# Patient Record
Sex: Female | Born: 1965 | Race: White | Hispanic: No | Marital: Married | Smoking: Former smoker
Health system: Southern US, Community
[De-identification: ages and names within clinical notes are randomized; demographics above are authoritative.]

## PROBLEM LIST (undated history)

## (undated) DIAGNOSIS — J189 Pneumonia, unspecified organism: Secondary | ICD-10-CM

## (undated) DIAGNOSIS — R87619 Unspecified abnormal cytological findings in specimens from cervix uteri: Secondary | ICD-10-CM

## (undated) DIAGNOSIS — D259 Leiomyoma of uterus, unspecified: Secondary | ICD-10-CM

## (undated) DIAGNOSIS — K449 Diaphragmatic hernia without obstruction or gangrene: Secondary | ICD-10-CM

## (undated) DIAGNOSIS — M1990 Unspecified osteoarthritis, unspecified site: Secondary | ICD-10-CM

## (undated) DIAGNOSIS — K649 Unspecified hemorrhoids: Secondary | ICD-10-CM

## (undated) DIAGNOSIS — K635 Polyp of colon: Secondary | ICD-10-CM

## (undated) DIAGNOSIS — R55 Syncope and collapse: Secondary | ICD-10-CM

## (undated) DIAGNOSIS — K589 Irritable bowel syndrome without diarrhea: Secondary | ICD-10-CM

## (undated) DIAGNOSIS — K317 Polyp of stomach and duodenum: Secondary | ICD-10-CM

## (undated) DIAGNOSIS — F32A Depression, unspecified: Secondary | ICD-10-CM

## (undated) DIAGNOSIS — E785 Hyperlipidemia, unspecified: Secondary | ICD-10-CM

## (undated) DIAGNOSIS — N816 Rectocele: Secondary | ICD-10-CM

## (undated) DIAGNOSIS — K760 Fatty (change of) liver, not elsewhere classified: Secondary | ICD-10-CM

## (undated) DIAGNOSIS — R000 Tachycardia, unspecified: Secondary | ICD-10-CM

## (undated) DIAGNOSIS — M75102 Unspecified rotator cuff tear or rupture of left shoulder, not specified as traumatic: Secondary | ICD-10-CM

## (undated) DIAGNOSIS — F329 Major depressive disorder, single episode, unspecified: Secondary | ICD-10-CM

## (undated) DIAGNOSIS — K219 Gastro-esophageal reflux disease without esophagitis: Secondary | ICD-10-CM

## (undated) DIAGNOSIS — F419 Anxiety disorder, unspecified: Secondary | ICD-10-CM

## (undated) DIAGNOSIS — D509 Iron deficiency anemia, unspecified: Secondary | ICD-10-CM

## (undated) HISTORY — PX: HYSTEROSCOPY: SHX211

## (undated) HISTORY — DX: Polyp of stomach and duodenum: K31.7

## (undated) HISTORY — DX: Hyperlipidemia, unspecified: E78.5

## (undated) HISTORY — DX: Gastro-esophageal reflux disease without esophagitis: K21.9

## (undated) HISTORY — DX: Unspecified osteoarthritis, unspecified site: M19.90

## (undated) HISTORY — DX: Unspecified hemorrhoids: K64.9

## (undated) HISTORY — DX: Unspecified abnormal cytological findings in specimens from cervix uteri: R87.619

## (undated) HISTORY — DX: Tachycardia, unspecified: R00.0

## (undated) HISTORY — DX: Irritable bowel syndrome, unspecified: K58.9

## (undated) HISTORY — DX: Polyp of colon: K63.5

## (undated) HISTORY — DX: Anxiety disorder, unspecified: F41.9

## (undated) HISTORY — DX: Iron deficiency anemia, unspecified: D50.9

## (undated) HISTORY — DX: Major depressive disorder, single episode, unspecified: F32.9

## (undated) HISTORY — DX: Pneumonia, unspecified organism: J18.9

## (undated) HISTORY — DX: Syncope and collapse: R55

## (undated) HISTORY — DX: Diaphragmatic hernia without obstruction or gangrene: K44.9

## (undated) HISTORY — DX: Fatty (change of) liver, not elsewhere classified: K76.0

## (undated) HISTORY — DX: Rectocele: N81.6

## (undated) HISTORY — DX: Depression, unspecified: F32.A

## (undated) HISTORY — PX: TRIGGER FINGER RELEASE: SHX641

## (undated) HISTORY — PX: CARPAL TUNNEL RELEASE: SHX101

## (undated) HISTORY — PX: ROTATOR CUFF REPAIR: SHX139

## (undated) HISTORY — DX: Unspecified rotator cuff tear or rupture of left shoulder, not specified as traumatic: M75.102

---

## 1997-07-03 HISTORY — PX: COLONOSCOPY: SHX174

## 1998-03-30 ENCOUNTER — Ambulatory Visit (HOSPITAL_COMMUNITY): Admission: RE | Admit: 1998-03-30 | Discharge: 1998-03-30 | Payer: Self-pay | Admitting: Gastroenterology

## 1998-03-31 ENCOUNTER — Encounter: Admission: RE | Admit: 1998-03-31 | Discharge: 1998-03-31 | Payer: Self-pay | Admitting: Internal Medicine

## 1998-04-30 ENCOUNTER — Encounter: Admission: RE | Admit: 1998-04-30 | Discharge: 1998-04-30 | Payer: Self-pay | Admitting: Internal Medicine

## 1998-04-30 ENCOUNTER — Ambulatory Visit (HOSPITAL_COMMUNITY): Admission: RE | Admit: 1998-04-30 | Discharge: 1998-04-30 | Payer: Self-pay | Admitting: Internal Medicine

## 1998-04-30 ENCOUNTER — Encounter: Payer: Self-pay | Admitting: Internal Medicine

## 1998-05-14 ENCOUNTER — Encounter: Admission: RE | Admit: 1998-05-14 | Discharge: 1998-05-14 | Payer: Self-pay | Admitting: Internal Medicine

## 1998-07-14 ENCOUNTER — Encounter: Admission: RE | Admit: 1998-07-14 | Discharge: 1998-07-14 | Payer: Self-pay | Admitting: Hematology and Oncology

## 1998-10-19 ENCOUNTER — Encounter: Admission: RE | Admit: 1998-10-19 | Discharge: 1998-10-19 | Payer: Self-pay | Admitting: Hematology and Oncology

## 1998-10-20 ENCOUNTER — Encounter: Admission: RE | Admit: 1998-10-20 | Discharge: 1998-10-20 | Payer: Self-pay | Admitting: Hematology and Oncology

## 1998-11-09 ENCOUNTER — Other Ambulatory Visit: Admission: RE | Admit: 1998-11-09 | Discharge: 1998-11-09 | Payer: Self-pay | Admitting: *Deleted

## 1998-11-09 ENCOUNTER — Encounter: Admission: RE | Admit: 1998-11-09 | Discharge: 1998-11-09 | Payer: Self-pay | Admitting: Internal Medicine

## 1999-02-24 ENCOUNTER — Encounter: Admission: RE | Admit: 1999-02-24 | Discharge: 1999-02-24 | Payer: Self-pay | Admitting: Internal Medicine

## 1999-10-24 ENCOUNTER — Ambulatory Visit (HOSPITAL_COMMUNITY): Admission: RE | Admit: 1999-10-24 | Discharge: 1999-10-24 | Payer: Self-pay | Admitting: Gastroenterology

## 1999-10-24 ENCOUNTER — Encounter (INDEPENDENT_AMBULATORY_CARE_PROVIDER_SITE_OTHER): Payer: Self-pay

## 2000-12-05 ENCOUNTER — Emergency Department (HOSPITAL_COMMUNITY): Admission: EM | Admit: 2000-12-05 | Discharge: 2000-12-05 | Payer: Self-pay

## 2002-09-08 ENCOUNTER — Encounter: Payer: Self-pay | Admitting: Family Medicine

## 2002-09-08 ENCOUNTER — Encounter: Admission: RE | Admit: 2002-09-08 | Discharge: 2002-09-08 | Payer: Self-pay | Admitting: Family Medicine

## 2002-11-17 ENCOUNTER — Other Ambulatory Visit: Admission: RE | Admit: 2002-11-17 | Discharge: 2002-11-17 | Payer: Self-pay | Admitting: Obstetrics & Gynecology

## 2002-11-17 ENCOUNTER — Other Ambulatory Visit: Admission: RE | Admit: 2002-11-17 | Discharge: 2002-11-17 | Payer: Self-pay | Admitting: Obstetrics and Gynecology

## 2003-01-17 ENCOUNTER — Inpatient Hospital Stay (HOSPITAL_COMMUNITY): Admission: AD | Admit: 2003-01-17 | Discharge: 2003-01-17 | Payer: Self-pay | Admitting: Obstetrics and Gynecology

## 2003-04-23 ENCOUNTER — Ambulatory Visit (HOSPITAL_BASED_OUTPATIENT_CLINIC_OR_DEPARTMENT_OTHER): Admission: RE | Admit: 2003-04-23 | Discharge: 2003-04-23 | Payer: Self-pay | Admitting: Orthopedic Surgery

## 2003-06-18 ENCOUNTER — Other Ambulatory Visit: Admission: RE | Admit: 2003-06-18 | Discharge: 2003-06-18 | Payer: Self-pay | Admitting: Obstetrics and Gynecology

## 2003-08-25 ENCOUNTER — Emergency Department (HOSPITAL_COMMUNITY): Admission: EM | Admit: 2003-08-25 | Discharge: 2003-08-25 | Payer: Self-pay | Admitting: *Deleted

## 2003-09-01 ENCOUNTER — Encounter: Payer: Self-pay | Admitting: Family Medicine

## 2003-09-01 LAB — CONVERTED CEMR LAB

## 2003-10-05 ENCOUNTER — Encounter (INDEPENDENT_AMBULATORY_CARE_PROVIDER_SITE_OTHER): Payer: Self-pay | Admitting: *Deleted

## 2003-10-05 ENCOUNTER — Ambulatory Visit (HOSPITAL_COMMUNITY): Admission: RE | Admit: 2003-10-05 | Discharge: 2003-10-05 | Payer: Self-pay | Admitting: Obstetrics and Gynecology

## 2003-10-12 ENCOUNTER — Encounter: Admission: RE | Admit: 2003-10-12 | Discharge: 2003-12-11 | Payer: Self-pay | Admitting: Orthopedic Surgery

## 2003-11-09 ENCOUNTER — Encounter: Admission: RE | Admit: 2003-11-09 | Discharge: 2003-11-09 | Payer: Self-pay | Admitting: Family Medicine

## 2004-06-03 ENCOUNTER — Ambulatory Visit: Payer: Self-pay | Admitting: Family Medicine

## 2004-07-03 HISTORY — PX: LAPAROSCOPY: SHX197

## 2004-08-10 ENCOUNTER — Encounter: Admission: RE | Admit: 2004-08-10 | Discharge: 2004-08-10 | Payer: Self-pay | Admitting: Obstetrics and Gynecology

## 2004-09-02 ENCOUNTER — Ambulatory Visit: Payer: Self-pay | Admitting: Family Medicine

## 2004-11-10 ENCOUNTER — Encounter: Admission: RE | Admit: 2004-11-10 | Discharge: 2004-11-10 | Payer: Self-pay | Admitting: Allergy and Immunology

## 2004-12-09 ENCOUNTER — Ambulatory Visit (HOSPITAL_COMMUNITY): Admission: RE | Admit: 2004-12-09 | Discharge: 2004-12-09 | Payer: Self-pay | Admitting: Obstetrics and Gynecology

## 2005-02-01 ENCOUNTER — Ambulatory Visit: Payer: Self-pay | Admitting: Family Medicine

## 2005-03-13 ENCOUNTER — Ambulatory Visit: Payer: Self-pay | Admitting: Family Medicine

## 2005-04-04 ENCOUNTER — Ambulatory Visit: Payer: Self-pay | Admitting: Family Medicine

## 2005-04-18 ENCOUNTER — Ambulatory Visit: Payer: Self-pay | Admitting: Family Medicine

## 2005-04-25 ENCOUNTER — Encounter: Admission: RE | Admit: 2005-04-25 | Discharge: 2005-04-25 | Payer: Self-pay | Admitting: Family Medicine

## 2005-05-03 ENCOUNTER — Ambulatory Visit (HOSPITAL_COMMUNITY): Admission: RE | Admit: 2005-05-03 | Discharge: 2005-05-03 | Payer: Self-pay | Admitting: Obstetrics and Gynecology

## 2005-05-03 ENCOUNTER — Encounter (INDEPENDENT_AMBULATORY_CARE_PROVIDER_SITE_OTHER): Payer: Self-pay | Admitting: Specialist

## 2005-08-11 ENCOUNTER — Encounter: Admission: RE | Admit: 2005-08-11 | Discharge: 2005-08-11 | Payer: Self-pay | Admitting: Obstetrics and Gynecology

## 2005-09-25 ENCOUNTER — Ambulatory Visit: Payer: Self-pay | Admitting: Family Medicine

## 2005-10-24 ENCOUNTER — Ambulatory Visit: Payer: Self-pay | Admitting: Family Medicine

## 2006-01-17 ENCOUNTER — Ambulatory Visit: Payer: Self-pay | Admitting: Internal Medicine

## 2006-03-27 ENCOUNTER — Ambulatory Visit: Payer: Self-pay | Admitting: Family Medicine

## 2006-04-24 ENCOUNTER — Ambulatory Visit: Payer: Self-pay | Admitting: Family Medicine

## 2006-05-15 ENCOUNTER — Ambulatory Visit: Payer: Self-pay | Admitting: Family Medicine

## 2006-07-25 ENCOUNTER — Ambulatory Visit: Payer: Self-pay | Admitting: Family Medicine

## 2006-08-20 ENCOUNTER — Ambulatory Visit: Payer: Self-pay | Admitting: Family Medicine

## 2006-08-28 ENCOUNTER — Encounter: Admission: RE | Admit: 2006-08-28 | Discharge: 2006-08-28 | Payer: Self-pay | Admitting: Obstetrics and Gynecology

## 2006-10-12 ENCOUNTER — Ambulatory Visit: Payer: Self-pay | Admitting: Family Medicine

## 2006-10-30 ENCOUNTER — Encounter: Payer: Self-pay | Admitting: Family Medicine

## 2006-10-30 DIAGNOSIS — F419 Anxiety disorder, unspecified: Secondary | ICD-10-CM

## 2006-10-30 DIAGNOSIS — Z87898 Personal history of other specified conditions: Secondary | ICD-10-CM | POA: Insufficient documentation

## 2006-10-30 DIAGNOSIS — F329 Major depressive disorder, single episode, unspecified: Secondary | ICD-10-CM | POA: Insufficient documentation

## 2006-10-30 DIAGNOSIS — J45909 Unspecified asthma, uncomplicated: Secondary | ICD-10-CM | POA: Insufficient documentation

## 2006-10-30 DIAGNOSIS — K648 Other hemorrhoids: Secondary | ICD-10-CM | POA: Insufficient documentation

## 2006-10-30 DIAGNOSIS — F32A Depression, unspecified: Secondary | ICD-10-CM | POA: Insufficient documentation

## 2006-10-30 DIAGNOSIS — E782 Mixed hyperlipidemia: Secondary | ICD-10-CM | POA: Insufficient documentation

## 2006-12-21 ENCOUNTER — Telehealth (INDEPENDENT_AMBULATORY_CARE_PROVIDER_SITE_OTHER): Payer: Self-pay | Admitting: Internal Medicine

## 2007-06-04 ENCOUNTER — Ambulatory Visit: Payer: Self-pay | Admitting: Family Medicine

## 2007-06-14 ENCOUNTER — Telehealth: Payer: Self-pay | Admitting: Family Medicine

## 2007-06-20 ENCOUNTER — Ambulatory Visit: Payer: Self-pay | Admitting: Family Medicine

## 2007-07-04 DIAGNOSIS — M75102 Unspecified rotator cuff tear or rupture of left shoulder, not specified as traumatic: Secondary | ICD-10-CM

## 2007-07-04 HISTORY — DX: Unspecified rotator cuff tear or rupture of left shoulder, not specified as traumatic: M75.102

## 2007-08-05 ENCOUNTER — Ambulatory Visit: Payer: Self-pay | Admitting: Family Medicine

## 2007-08-08 ENCOUNTER — Telehealth: Payer: Self-pay | Admitting: Family Medicine

## 2007-08-12 ENCOUNTER — Telehealth: Payer: Self-pay | Admitting: Family Medicine

## 2007-08-23 ENCOUNTER — Ambulatory Visit: Payer: Self-pay | Admitting: Family Medicine

## 2007-10-08 ENCOUNTER — Ambulatory Visit: Payer: Self-pay | Admitting: Family Medicine

## 2007-10-08 DIAGNOSIS — N809 Endometriosis, unspecified: Secondary | ICD-10-CM | POA: Insufficient documentation

## 2007-10-08 DIAGNOSIS — K589 Irritable bowel syndrome without diarrhea: Secondary | ICD-10-CM | POA: Insufficient documentation

## 2007-10-10 ENCOUNTER — Telehealth: Payer: Self-pay | Admitting: Family Medicine

## 2007-10-14 ENCOUNTER — Telehealth: Payer: Self-pay | Admitting: Family Medicine

## 2007-10-15 ENCOUNTER — Encounter: Payer: Self-pay | Admitting: Family Medicine

## 2007-10-23 ENCOUNTER — Encounter: Admission: RE | Admit: 2007-10-23 | Discharge: 2007-10-23 | Payer: Self-pay | Admitting: Family Medicine

## 2007-10-25 ENCOUNTER — Encounter (INDEPENDENT_AMBULATORY_CARE_PROVIDER_SITE_OTHER): Payer: Self-pay | Admitting: *Deleted

## 2007-10-30 ENCOUNTER — Ambulatory Visit: Payer: Self-pay | Admitting: Family Medicine

## 2007-11-04 ENCOUNTER — Ambulatory Visit: Payer: Self-pay | Admitting: Family Medicine

## 2007-11-15 ENCOUNTER — Ambulatory Visit: Payer: Self-pay | Admitting: Family Medicine

## 2007-11-15 DIAGNOSIS — Z86010 Personal history of colonic polyps: Secondary | ICD-10-CM | POA: Insufficient documentation

## 2007-11-20 ENCOUNTER — Ambulatory Visit: Payer: Self-pay | Admitting: Cardiology

## 2007-11-27 ENCOUNTER — Ambulatory Visit: Payer: Self-pay

## 2007-11-27 ENCOUNTER — Encounter: Payer: Self-pay | Admitting: Cardiology

## 2007-12-03 ENCOUNTER — Telehealth: Payer: Self-pay | Admitting: Family Medicine

## 2007-12-04 ENCOUNTER — Ambulatory Visit: Payer: Self-pay | Admitting: Orthopaedic Surgery

## 2007-12-10 ENCOUNTER — Ambulatory Visit: Payer: Self-pay | Admitting: Orthopaedic Surgery

## 2008-01-02 ENCOUNTER — Encounter: Payer: Self-pay | Admitting: Family Medicine

## 2008-02-04 ENCOUNTER — Ambulatory Visit: Payer: Self-pay | Admitting: Family Medicine

## 2008-02-05 LAB — CONVERTED CEMR LAB
ALT: 23 units/L (ref 0–35)
AST: 20 units/L (ref 0–37)
Cholesterol: 261 mg/dL (ref 0–200)
Direct LDL: 198.5 mg/dL
HDL: 41.7 mg/dL (ref >39.0)
Total CHOL/HDL Ratio: 6.3
Triglycerides: 176 mg/dL — ABNORMAL HIGH (ref 0–149)
VLDL: 35 mg/dL (ref 0–40)

## 2008-02-11 ENCOUNTER — Ambulatory Visit: Payer: Self-pay | Admitting: Family Medicine

## 2008-02-14 ENCOUNTER — Telehealth (INDEPENDENT_AMBULATORY_CARE_PROVIDER_SITE_OTHER): Payer: Self-pay | Admitting: *Deleted

## 2008-03-24 ENCOUNTER — Encounter (INDEPENDENT_AMBULATORY_CARE_PROVIDER_SITE_OTHER): Payer: Self-pay | Admitting: *Deleted

## 2008-04-17 ENCOUNTER — Telehealth: Payer: Self-pay | Admitting: Family Medicine

## 2008-04-30 ENCOUNTER — Ambulatory Visit: Payer: Self-pay | Admitting: Family Medicine

## 2008-06-03 ENCOUNTER — Telehealth: Payer: Self-pay | Admitting: Family Medicine

## 2008-06-12 ENCOUNTER — Ambulatory Visit: Payer: Self-pay | Admitting: Family Medicine

## 2008-08-25 ENCOUNTER — Ambulatory Visit: Payer: Self-pay | Admitting: Family Medicine

## 2008-08-25 LAB — CONVERTED CEMR LAB
Cholesterol, target level: 200 mg/dL
HDL goal, serum: 40 mg/dL
LDL Goal: 160 mg/dL

## 2008-09-01 ENCOUNTER — Telehealth: Payer: Self-pay | Admitting: Family Medicine

## 2008-09-03 ENCOUNTER — Encounter: Admission: RE | Admit: 2008-09-03 | Discharge: 2008-09-03 | Payer: Self-pay | Admitting: Family Medicine

## 2008-09-03 ENCOUNTER — Ambulatory Visit: Payer: Self-pay | Admitting: Family Medicine

## 2008-09-09 ENCOUNTER — Encounter: Payer: Self-pay | Admitting: Family Medicine

## 2008-09-24 ENCOUNTER — Encounter: Payer: Self-pay | Admitting: Family Medicine

## 2008-10-23 ENCOUNTER — Encounter: Admission: RE | Admit: 2008-10-23 | Discharge: 2008-10-23 | Payer: Self-pay | Admitting: Family Medicine

## 2008-10-23 ENCOUNTER — Ambulatory Visit: Payer: Self-pay | Admitting: Internal Medicine

## 2008-10-23 ENCOUNTER — Encounter (INDEPENDENT_AMBULATORY_CARE_PROVIDER_SITE_OTHER): Payer: Self-pay | Admitting: Internal Medicine

## 2008-10-28 ENCOUNTER — Encounter (INDEPENDENT_AMBULATORY_CARE_PROVIDER_SITE_OTHER): Payer: Self-pay | Admitting: *Deleted

## 2008-11-05 ENCOUNTER — Telehealth (INDEPENDENT_AMBULATORY_CARE_PROVIDER_SITE_OTHER): Payer: Self-pay | Admitting: *Deleted

## 2008-12-01 ENCOUNTER — Encounter: Payer: Self-pay | Admitting: Family Medicine

## 2009-01-01 ENCOUNTER — Encounter (INDEPENDENT_AMBULATORY_CARE_PROVIDER_SITE_OTHER): Payer: Self-pay | Admitting: *Deleted

## 2009-02-24 ENCOUNTER — Telehealth: Payer: Self-pay | Admitting: Family Medicine

## 2009-03-02 ENCOUNTER — Encounter: Payer: Self-pay | Admitting: Family Medicine

## 2009-03-03 ENCOUNTER — Ambulatory Visit: Payer: Self-pay | Admitting: Family Medicine

## 2009-04-29 ENCOUNTER — Encounter: Payer: Self-pay | Admitting: Family Medicine

## 2009-06-24 ENCOUNTER — Ambulatory Visit: Payer: Self-pay | Admitting: Family Medicine

## 2009-07-09 ENCOUNTER — Telehealth: Payer: Self-pay | Admitting: Family Medicine

## 2009-08-03 ENCOUNTER — Telehealth: Payer: Self-pay | Admitting: Family Medicine

## 2009-10-25 ENCOUNTER — Encounter: Admission: RE | Admit: 2009-10-25 | Discharge: 2009-10-25 | Payer: Self-pay | Admitting: Family Medicine

## 2009-10-27 ENCOUNTER — Encounter (INDEPENDENT_AMBULATORY_CARE_PROVIDER_SITE_OTHER): Payer: Self-pay | Admitting: *Deleted

## 2009-10-27 ENCOUNTER — Encounter: Payer: Self-pay | Admitting: Family Medicine

## 2009-12-09 ENCOUNTER — Telehealth: Payer: Self-pay | Admitting: Family Medicine

## 2010-04-07 ENCOUNTER — Encounter (INDEPENDENT_AMBULATORY_CARE_PROVIDER_SITE_OTHER): Payer: Self-pay | Admitting: *Deleted

## 2010-05-12 ENCOUNTER — Ambulatory Visit: Payer: Self-pay | Admitting: Family Medicine

## 2010-06-09 ENCOUNTER — Ambulatory Visit: Payer: Self-pay | Admitting: Family Medicine

## 2010-06-09 DIAGNOSIS — R3129 Other microscopic hematuria: Secondary | ICD-10-CM | POA: Insufficient documentation

## 2010-06-09 LAB — CONVERTED CEMR LAB
Bilirubin Urine: NEGATIVE
Glucose, Urine, Semiquant: NEGATIVE
Ketones, urine, test strip: NEGATIVE
Nitrite: NEGATIVE
Specific Gravity, Urine: 1.02
Urobilinogen, UA: 0.2
WBC Urine, dipstick: NEGATIVE
pH: 6

## 2010-06-10 ENCOUNTER — Encounter: Payer: Self-pay | Admitting: Family Medicine

## 2010-06-29 ENCOUNTER — Telehealth: Payer: Self-pay | Admitting: Family Medicine

## 2010-07-03 DIAGNOSIS — D509 Iron deficiency anemia, unspecified: Secondary | ICD-10-CM

## 2010-07-03 HISTORY — DX: Iron deficiency anemia, unspecified: D50.9

## 2010-07-06 ENCOUNTER — Ambulatory Visit
Admission: RE | Admit: 2010-07-06 | Discharge: 2010-07-06 | Payer: Self-pay | Source: Home / Self Care | Attending: Family Medicine | Admitting: Family Medicine

## 2010-07-06 DIAGNOSIS — R350 Frequency of micturition: Secondary | ICD-10-CM | POA: Insufficient documentation

## 2010-07-06 LAB — CONVERTED CEMR LAB
Bacteria, UA: 0
Beta hcg, urine, semiquantitative: NEGATIVE
Bilirubin Urine: NEGATIVE
Casts: 0 /lpf
Glucose, Urine, Semiquant: NEGATIVE
KOH Prep: NEGATIVE
Ketones, urine, test strip: NEGATIVE
Mucus, UA: 0
Nitrite: NEGATIVE
Protein, U semiquant: NEGATIVE
RBC / HPF: 0
Specific Gravity, Urine: 1.01
Urobilinogen, UA: 0.2
WBC Urine, dipstick: NEGATIVE
WBC, UA: 0 cells/hpf
Whiff Test: NEGATIVE
Yeast, UA: 0
pH: 6

## 2010-07-11 ENCOUNTER — Telehealth (INDEPENDENT_AMBULATORY_CARE_PROVIDER_SITE_OTHER): Payer: Self-pay | Admitting: *Deleted

## 2010-07-13 ENCOUNTER — Ambulatory Visit
Admission: RE | Admit: 2010-07-13 | Discharge: 2010-07-13 | Payer: Self-pay | Source: Home / Self Care | Attending: Family Medicine | Admitting: Family Medicine

## 2010-07-13 ENCOUNTER — Other Ambulatory Visit: Payer: Self-pay | Admitting: Family Medicine

## 2010-07-13 LAB — CBC WITH DIFFERENTIAL/PLATELET
Basophils Absolute: 0.1 10*3/uL (ref 0.0–0.1)
Basophils Relative: 1.2 % (ref 0.0–3.0)
Eosinophils Absolute: 0.2 10*3/uL (ref 0.0–0.7)
Eosinophils Relative: 3.4 % (ref 0.0–5.0)
HCT: 26.3 % — ABNORMAL LOW (ref 36.0–46.0)
Hemoglobin: 8.4 g/dL — ABNORMAL LOW (ref 12.0–15.0)
Lymphocytes Relative: 36.1 % (ref 12.0–46.0)
Lymphs Abs: 2 10*3/uL (ref 0.7–4.0)
MCHC: 32.1 g/dL (ref 30.0–36.0)
MCV: 67.2 fl — ABNORMAL LOW (ref 78.0–100.0)
Monocytes Absolute: 0.5 10*3/uL (ref 0.1–1.0)
Monocytes Relative: 8.2 % (ref 3.0–12.0)
Neutro Abs: 2.8 10*3/uL (ref 1.4–7.7)
Neutrophils Relative %: 51.1 % (ref 43.0–77.0)
Platelets: 366 10*3/uL (ref 150.0–400.0)
RBC: 3.91 Mil/uL (ref 3.87–5.11)
RDW: 18.7 % — ABNORMAL HIGH (ref 11.5–14.6)
WBC: 5.5 10*3/uL (ref 4.5–10.5)

## 2010-07-13 LAB — BASIC METABOLIC PANEL
BUN: 14 mg/dL (ref 6–23)
CO2: 29 mEq/L (ref 19–32)
Calcium: 9 mg/dL (ref 8.4–10.5)
Chloride: 107 mEq/L (ref 96–112)
Creatinine, Ser: 0.7 mg/dL (ref 0.4–1.2)
GFR: 91.4 mL/min (ref >60.00)
Glucose, Bld: 106 mg/dL — ABNORMAL HIGH (ref 70–99)
Potassium: 4.5 mEq/L (ref 3.5–5.1)
Sodium: 141 mEq/L (ref 135–145)

## 2010-07-13 LAB — HEPATIC FUNCTION PANEL
ALT: 14 U/L (ref 0–35)
AST: 17 U/L (ref 0–37)
Albumin: 3.4 g/dL — ABNORMAL LOW (ref 3.5–5.2)
Alkaline Phosphatase: 84 U/L (ref 39–117)
Bilirubin, Direct: 0 mg/dL (ref 0.0–0.3)
Total Bilirubin: 0.4 mg/dL (ref 0.3–1.2)
Total Protein: 6.2 g/dL (ref 6.0–8.3)

## 2010-07-13 LAB — LIPID PANEL
Cholesterol: 199 mg/dL (ref 0–200)
HDL: 40.7 mg/dL (ref >39.00)
LDL Cholesterol: 135 mg/dL — ABNORMAL HIGH (ref 0–99)
Total CHOL/HDL Ratio: 5
Triglycerides: 117 mg/dL (ref 0.0–149.0)
VLDL: 23.4 mg/dL (ref 0.0–40.0)

## 2010-07-13 LAB — TSH: TSH: 2.3 u[IU]/mL (ref 0.35–5.50)

## 2010-07-19 ENCOUNTER — Encounter: Payer: Self-pay | Admitting: Family Medicine

## 2010-07-19 ENCOUNTER — Ambulatory Visit
Admission: RE | Admit: 2010-07-19 | Discharge: 2010-07-19 | Payer: Self-pay | Source: Home / Self Care | Attending: Family Medicine | Admitting: Family Medicine

## 2010-07-19 ENCOUNTER — Other Ambulatory Visit: Payer: Self-pay | Admitting: Family Medicine

## 2010-07-19 ENCOUNTER — Other Ambulatory Visit
Admission: RE | Admit: 2010-07-19 | Discharge: 2010-07-19 | Payer: Self-pay | Source: Home / Self Care | Admitting: Family Medicine

## 2010-07-19 DIAGNOSIS — Z862 Personal history of diseases of the blood and blood-forming organs and certain disorders involving the immune mechanism: Secondary | ICD-10-CM | POA: Insufficient documentation

## 2010-07-19 LAB — CONVERTED CEMR LAB: Pap Smear: NORMAL

## 2010-07-19 LAB — B12 AND FOLATE PANEL
Folate: >24.8 ng/mL (ref >5.9)
Vitamin B-12: 478 pg/mL (ref 211–911)

## 2010-07-19 LAB — IRON: Iron: 13 ug/dL — ABNORMAL LOW (ref 42–145)

## 2010-07-19 LAB — FERRITIN: Ferritin: 2.8 ng/mL — ABNORMAL LOW (ref 10.0–291.0)

## 2010-07-19 LAB — HM PAP SMEAR

## 2010-07-23 ENCOUNTER — Other Ambulatory Visit: Payer: Self-pay | Admitting: Family Medicine

## 2010-07-23 DIAGNOSIS — Z0000 Encounter for general adult medical examination without abnormal findings: Secondary | ICD-10-CM

## 2010-07-26 ENCOUNTER — Encounter: Payer: Self-pay | Admitting: Family Medicine

## 2010-07-26 ENCOUNTER — Encounter (INDEPENDENT_AMBULATORY_CARE_PROVIDER_SITE_OTHER): Payer: Self-pay | Admitting: *Deleted

## 2010-07-31 LAB — CONVERTED CEMR LAB
ALT: 12 units/L (ref 0–35)
AST: 16 units/L (ref 0–37)
Albumin: 3.3 g/dL — ABNORMAL LOW (ref 3.5–5.2)
Alkaline Phosphatase: 69 units/L (ref 39–117)
BUN: 7 mg/dL (ref 6–23)
Basophils Absolute: 0 10*3/uL (ref 0.0–0.1)
Basophils Relative: 0.2 % (ref 0.0–1.0)
Bilirubin, Direct: 0.1 mg/dL (ref 0.0–0.3)
CO2: 28 meq/L (ref 19–32)
Calcium: 9.1 mg/dL (ref 8.4–10.5)
Chloride: 107 meq/L (ref 96–112)
Cholesterol: 217 mg/dL (ref 0–200)
Creatinine, Ser: 0.8 mg/dL (ref 0.4–1.2)
Direct LDL: 171.7 mg/dL
Eosinophils Absolute: 0.2 10*3/uL (ref 0.0–0.7)
Eosinophils Relative: 4.2 % (ref 0.0–5.0)
GFR calc Af Amer: 101 mL/min
GFR calc non Af Amer: 84 mL/min
Glucose, Bld: 102 mg/dL — ABNORMAL HIGH (ref 70–99)
HCT: 37.2 % (ref 36.0–46.0)
HDL: 41.6 mg/dL (ref >39.0)
Hemoglobin: 12.5 g/dL (ref 12.0–15.0)
Lymphocytes Relative: 33.8 % (ref 12.0–46.0)
MCHC: 33.4 g/dL (ref 30.0–36.0)
MCV: 89.4 fL (ref 78.0–100.0)
Monocytes Absolute: 0.4 10*3/uL (ref 0.1–1.0)
Monocytes Relative: 8.4 % (ref 3.0–12.0)
Neutro Abs: 2.8 10*3/uL (ref 1.4–7.7)
Neutrophils Relative %: 53.4 % (ref 43.0–77.0)
Platelets: 240 10*3/uL (ref 150–400)
Potassium: 4.1 meq/L (ref 3.5–5.1)
RBC: 4.16 M/uL (ref 3.87–5.11)
RDW: 13.1 % (ref 11.5–14.6)
Sodium: 140 meq/L (ref 135–145)
TSH: 1.54 microintl units/mL (ref 0.35–5.50)
Total Bilirubin: 0.6 mg/dL (ref 0.3–1.2)
Total CHOL/HDL Ratio: 5.2
Total Protein: 6.1 g/dL (ref 6.0–8.3)
Triglycerides: 84 mg/dL (ref 0–149)
VLDL: 17 mg/dL (ref 0–40)
WBC: 5.1 10*3/uL (ref 4.5–10.5)

## 2010-08-02 NOTE — Progress Notes (Signed)
Summary: Rx Fluoxetine  Phone Note Refill Request Call back at [phone] Message from:  [facility] on August 03, 2009 9:47 AM  Refills Requested: Medication #1:  FLUOXETINE HCL 40 MG CAPS Take one by mouth q pm Received faxed refill request, please advise   Method Requested: Telephone to Pharmacy Initial call taken by: Linde Gillis CMA Duncan Dull),  August 03, 2009 9:48 AM  Follow-up for Phone Call        px written on EMR for call in  Follow-up by: Judith Part MD,  August 03, 2009 10:59 AM  Additional Follow-up for Phone Call Additional follow up Details #1::        Rx called to pharmacy Additional Follow-up by: Linde Gillis CMA Duncan Dull),  August 03, 2009 12:41 PM    Prescriptions: FLUOXETINE HCL 40 MG CAPS (FLUOXETINE HCL) Take one by mouth q pm  #30 x 5   Entered and Authorized by:   Judith Part MD   Signed by:   Judith Part MD on 08/03/2009   Method used:   Telephoned to ...       [facility]       [address]       [place], [place]  [address]       Ph: [phone]       Fax: [phone]   RxID:   [id number]

## 2010-08-02 NOTE — Letter (Signed)
Summary: Results Follow up Letter  [place] [facility]  [address] [place], [place] [address]   Phone: [phone]  Fax: [phone]    10/27/2009 MRN: [id number]    Michele [name] [address] [place], [place]  [address]    Dear [name],  The following are the results of your recent test(s):  Test         Result    Pap Smear:        Normal _____  Not Normal _____ Comments: ______________________________________________________ Cholesterol: LDL(Bad cholesterol):         Your goal is less than:         HDL (Good cholesterol):       Your goal is more than: Comments:  ______________________________________________________ Mammogram:        Normal ___X__  Not Normal _____ Comments:  Yearly follow up is recommended.   ___________________________________________________________________ Hemoccult:        Normal _____  Not normal _______ Comments:    _____________________________________________________________________ Other Tests:    We routinely do not discuss normal results over the telephone.  If you desire a copy of the results, or you have any questions about this information we can discuss them at your next office visit.   Sincerely,    Marne A. Milinda Antis, M.D.  MAT:lsf

## 2010-08-02 NOTE — Progress Notes (Signed)
Summary: Singulair 10mg  and Nexium 40mg  refill (does pt need appt.)  Phone Note Refill Request Call back at [phone] [facility]  [facility] on December 09, 2009 12:24 PM  Refills Requested: Medication #1:  SINGULAIR 10 MG TABS Take one by mouth qhs  Medication #2:  NEXIUM 40 MG CPDR 1 by mouth each am [facility] request refill for Singulair 10mg  and Nexium 40 mg. No refill date given. I cannot see where pt has had labs since 02/2008. does pt need appt? Please advise.    Method Requested: Telephone to Pharmacy Initial call taken by: [name] LPN,  December 10, 3242 12:26 PM  Follow-up for Phone Call        schedule her for f/u (will do labs then) this summer  thanks px written on EMR for call in   Additional Follow-up for Phone Call Additional follow up Details #1::        [facility] as instructed. request added to rx for pt to call for appt this summer.[name] LPN  December 09, 100 12:55 PM     New/Updated Medications: SINGULAIR 10 MG TABS (MONTELUKAST SODIUM) Take one by mouth at bedtime NEXIUM 40 MG CPDR (ESOMEPRAZOLE MAGNESIUM) 1 by mouth each am Prescriptions: NEXIUM 40 MG CPDR (ESOMEPRAZOLE MAGNESIUM) 1 by mouth each am  #30 x 11   Entered and Authorized by:   Judith Part MD   Signed by:   [name] LPN on 72/53/6644   Method used:   Telephoned to ...       [id number]       [address]       [place], [place]  [address]       Ph: [phone]       Fax: [phone]   RxID:   [id number] SINGULAIR 10 MG TABS (MONTELUKAST SODIUM) Take one by mouth at bedtime  #30 x 11   Entered and Authorized by:   Judith Part MD   Signed by:   [name] LPN on 22/08/5425   Method used:   Telephoned to ...       [facility]       [address]       [place], [place]  [address]       Ph: [phone]       Fax:  [phone]   RxID:   [id number]

## 2010-08-02 NOTE — Assessment & Plan Note (Signed)
Summary: cough and congestion/alc   Vital Signs:  Patient profile:   45 year old female Height:      68 inches Weight:      194.75 pounds BMI:     29.72 O2 Sat:      98 % on Room air Temp:     98 degrees F oral Pulse rate:   84 / minute Pulse rhythm:   regular Resp:     20 per minute BP sitting:   126 / 84  (left arm) Cuff size:   regular  Vitals Entered By: [name] LPN (May 12, 2010 11:56 AM)  O2 Flow:  Room air CC: head and chest congestion, chest pain with deep breath,Lt ear draining and non productive cough   History of Present Illness: started getting sick with uri 2 days ago  hit very hard and suddenly bad head and chest congestion -- with dry rattly cough/ hacking  cough is making chest hurt  no wheeze  head full of congestion and sinus headache -- green mucous   has had chronic allergy problems lately   L ear was draining -- raw feeling on inside of thoat   no fever  no chills/ sweats   Allergies: 1)  ! * Telfa Pads 2)  ! Darvocet 3)  ! Nsaids 4)  Betadine  Past History:  Past Medical History: Last updated: 11/15/2007 Anxiety Asthma Hyperlipidemia tachycardia s/p neg cardiac wu with echo IBS endometriosis 09 ? L rot cuff tear  syncope  Past Surgical History: Last updated: 10/08/2007 abn pap cryotx uterine polyps, hysteroscopy 99 EGD 99 colonoscopy, small polyps, hemorrhoids carpal tunnel sx 5/05 abd [place] neg laparoscopy - 06 with endometriosis  Family History: Last updated: 11/04/2007 father Barrett's esophagus, HTN, DM  mother CVA,  migraine,, high chol  sister DM sister chol  Social History: Last updated: 02/11/2008 Former Smoker occ alcohol   Risk Factors: Smoking Status: quit (10/30/2006)  Review of Systems General:  Complains of fatigue, loss of appetite, and malaise; denies fever. Eyes:  Denies blurring and eye irritation. ENT:  Complains of earache, nasal congestion, postnasal drainage, sinus pressure, and sore  throat. CV:  Denies chest pain or discomfort and palpitations. Resp:  Complains of cough; denies pleuritic, shortness of breath, and wheezing. GI:  Denies diarrhea, nausea, and vomiting. Derm:  Denies rash.  Physical Exam  General:  overweight but generally well appearing  Head:  bilat maxillary sinus tenderness normocephalic, atraumatic, and no abnormalities observed.   Eyes:  vision grossly intact, pupils equal, pupils round, and pupils reactive to light.  no conjunctival pallor, injection or icterus  Ears:  R ear normal and L ear normal.   Nose:  nares are injected and congested bilaterally  Mouth:  pharynx pink and moist, no erythema, and no exudates.   Neck:  No deformities, masses, or tenderness noted. Lungs:  Normal respiratory effort, chest expands symmetrically. Lungs are clear to auscultation, no crackles or wheezes. Heart:  RRR  Skin:  Intact without suspicious lesions or rashes Cervical Nodes:  No lymphadenopathy noted Psych:  normal affect, talkative and pleasant    Impression & Recommendations:  Problem # 1:  URI (ICD-465.9) Assessment New  viral uri vs poss early sinusitis from allergies recommend sympt care- see pt instructions   pt advised to update me if symptoms worsen or do not improve will start augmentin if not imp in 2-3 d or worse sinus pain  Orders: Prescription Created Electronically [phone])  Complete Medication List:  1)  Singulair 10 Mg Tabs (Montelukast sodium) .... Take one by mouth at bedtime 2)  Fluoxetine Hcl 40 Mg Caps (Fluoxetine hcl) .... Take one by mouth q pm 3)  Anusol-hc 2.5 % Crea (Hydrocortisone) .... Apply to affected area once daily prn 4)  Omega 3  .... Daily 5)  Vitamin C  .... Daily 6)  Calcium  .... Daily 7)  Nexium 40 Mg Cpdr (Esomeprazole magnesium) .Marland Kitchen.. 1 by mouth each am 8)  Flonase 50 Mcg/act Susp (Fluticasone propionate) .... 2 sprays in each nostril once daily as needed 9)  Proair Hfa 108 (90 Base) Mcg/act Aers  (Albuterol sulfate) .... 2 puffs up to every 4 hours as needed wheezing 10)  Augmentin 875-125 Mg Tabs (Amoxicillin-pot clavulanate) .Marland Kitchen.. 1 by mouth two times a day for 10 days for sinus infection  Patient Instructions: 1)  you can try mucinex over the counter twice daily as directed and nasal saline spray for congestion 2)  tylenol over the counter as directed may help with aches, headache and fever 3)  call if symptoms worsen or if not improved in 4-5 days  4)  if not improving in 2-3 days take the augmentin  Prescriptions: FLONASE 50 MCG/ACT SUSP (FLUTICASONE PROPIONATE) 2 sprays in each nostril once daily as needed  #1 mdi x 11   Entered and Authorized by:   Judith Part MD   Signed by:   Judith Part MD on 05/12/2010   Method used:   Print then Give to Patient   RxID:   [id number] AUGMENTIN 875-125 MG TABS (AMOXICILLIN-POT CLAVULANATE) 1 by mouth two times a day for 10 days for sinus infection  #20 x 0   Entered and Authorized by:   Judith Part MD   Signed by:   Judith Part MD on 05/12/2010   Method used:   Print then Give to Patient   RxID:   [id number]    Orders Added: 1)  Est. Patient Level III [84696] 2)  Prescription Created Electronically [phone]    Current Allergies (reviewed today): ! * TELFA PADS ! DARVOCET ! NSAIDS BETADINE

## 2010-08-02 NOTE — Assessment & Plan Note (Signed)
Summary: BACK PAIN- TOWER PT   Vital Signs:  Patient profile:   45 year old female Height:      68 inches Weight:      194.50 pounds BMI:     29.68 Temp:     98.6 degrees F oral Pulse rate:   76 / minute Pulse rhythm:   regular BP sitting:   120 / 70  (right arm) Cuff size:   regular  Vitals Entered By: Linde Gillis CMA Duncan Dull) (June 09, 2010 8:26 AM) CC: back pain   History of Present Illness: 45 yo here for right sided back pain.  No known injury. A few days ago, right side of back started hurting, no radiculopathy. Pain is worsened by standing and walking. Back brace helps a little bit.  Has had some increased urinary frequency, no dysuria. She is not sure if this is related shince she has had urinary issues for years.  Just fininished course of Augmentin for sinusitis, now has a vaginal yeast infection.  Requesting diflucan.  Current Medications (verified): 1)  Singulair 10 Mg Tabs (Montelukast Sodium) .... Take One By Mouth At Bedtime 2)  Fluoxetine Hcl 40 Mg Caps (Fluoxetine Hcl) .... Take One By Mouth Q Pm 3)  Anusol-Hc 2.5 %  Crea (Hydrocortisone) .... Apply To Affected Area Once Daily Prn 4)  Omega 3 .... Daily 5)  Vitamin C .... Daily 6)  Calcium .... Daily 7)  Nexium 40 Mg Cpdr (Esomeprazole Magnesium) .Marland Kitchen.. 1 By Mouth Each Am 8)  Flonase 50 Mcg/act Susp (Fluticasone Propionate) .... 2 Sprays in Each Nostril Once Daily As Needed 9)  Proair Hfa 108 (90 Base) Mcg/act Aers (Albuterol Sulfate) .... 2 Puffs Up To Every 4 Hours As Needed Wheezing 10)  Diflucan 150 Mg Tabs (Fluconazole) .... Once Daily  Allergies: 1)  ! * Telfa Pads 2)  ! Darvocet 3)  ! Nsaids 4)  Betadine  Past History:  Past Medical History: Last updated: 11/15/2007 Anxiety Asthma Hyperlipidemia tachycardia s/p neg cardiac wu with echo IBS endometriosis 09 ? L rot cuff tear  syncope  Past Surgical History: Last updated: 10/08/2007 abn pap cryotx uterine polyps,  hysteroscopy 99 EGD 99 colonoscopy, small polyps, hemorrhoids carpal tunnel sx 5/05 abd [place] neg laparoscopy - 06 with endometriosis  Family History: Last updated: 11/04/2007 father Barrett's esophagus, HTN, DM  mother CVA,  migraine,, high chol  sister DM sister chol  Social History: Last updated: 02/11/2008 Former Smoker occ alcohol   Risk Factors: Smoking Status: quit (10/30/2006)  Review of Systems      See HPI General:  Denies fever. GI:  Denies abdominal pain. GU:  Complains of urinary frequency; denies dysuria and incontinence.  Physical Exam  General:  overweight but generally well appearing VSS, non toxic appearing  Abdomen:  soft and non-tender.   no suprapubic or CVA tenderness. Msk:  tight paraspinous muscle, right, SLR pos right, neg left. Neg fabers. normal strength. Neurologic:  DTRs symmetrical and normal.   Psych:  normal affect, talkative and pleasant    Impression & Recommendations:  Problem # 1:  BACK PAIN, RIGHT (ICD-724.5) Assessment New Most consistent with spasm/sciatica.  Will treat conservatively with flexeril, heat as needed. Her updated medication list for this problem includes:    Cyclobenzaprine Hcl 10 Mg Tabs (Cyclobenzaprine hcl) .Marland Kitchen... 1 by mouth 2 times daily as needed for back pain  Orders: UA Dipstick w/o Micro (manual) (27253)  Problem # 2:  MICROSCOPIC HEMATURIA (ICD-599.72) Assessment: New  Will send urine for culture. Likely due to vulvular irritation from yeast infection.  Treat with Diflucan. If urine culture neg, needs to come in for repeat UA with Dr. Milinda Antis.  Orders: T-Culture, Urine ([id number])  Complete Medication List: 1)  Singulair 10 Mg Tabs (Montelukast sodium) .... Take one by mouth at bedtime 2)  Fluoxetine Hcl 40 Mg Caps (Fluoxetine hcl) .... Take one by mouth q pm 3)  Anusol-hc 2.5 % Crea (Hydrocortisone) .... Apply to affected area once daily prn 4)  Omega 3  .... Daily 5)  Vitamin C  ....  Daily 6)  Calcium  .... Daily 7)  Nexium 40 Mg Cpdr (Esomeprazole magnesium) .Marland Kitchen.. 1 by mouth each am 8)  Flonase 50 Mcg/act Susp (Fluticasone propionate) .... 2 sprays in each nostril once daily as needed 9)  Proair Hfa 108 (90 Base) Mcg/act Aers (Albuterol sulfate) .... 2 puffs up to every 4 hours as needed wheezing 10)  Diflucan 150 Mg Tabs (Fluconazole) .... Once daily 11)  Cyclobenzaprine Hcl 10 Mg Tabs (Cyclobenzaprine hcl) .Marland Kitchen.. 1 by mouth 2 times daily as needed for back pain Prescriptions: CYCLOBENZAPRINE HCL 10 MG  TABS (CYCLOBENZAPRINE HCL) 1 by mouth 2 times daily as needed for back pain  #30 x 0   Entered and Authorized by:   Ruthe Mannan MD   Signed by:   Ruthe Mannan MD on 06/09/2010   Method used:   [facility]ronically to        [facility] [address] [address] [facility]retail)       [address]       [place], [place]  [address]       Ph: [phone] or [phone]       Fax: [phone]   RxID:   [id number] DIFLUCAN 150 MG TABS (FLUCONAZOLE) once daily  #1 x 0   Entered and Authorized by:   Ruthe Mannan MD   Signed by:   Ruthe Mannan MD on 06/09/2010   Method used:   Electronically to        [facility] [address] [facility]       [address]       [place], [place]  [address]       Ph: [phone] or [phone]       Fax: [phone]   RxID:   [id number]    Orders Added: 1)  UA Dipstick w/o Micro (manual) [81002] 2)  T-Culture, Urine [[id number]] 3)  Est. Patient Level IV [61607]    Current Allergies (reviewed today): ! * TELFA PADS ! DARVOCET ! NSAIDS BETADINE  Laboratory Results   Urine Tests  Date/Time Received: June 09, 2010 8:42 AM   Routine Urinalysis   Color: yellow Appearance: Clear Glucose: negative   (Normal Range: Negative) Bilirubin: negative   (Normal Range: Negative) Ketone: negative   (Normal Range: Negative) Spec. Gravity: 1.020   (Normal Range: 1.003-1.035) Blood: moderate   (Normal Range: Negative) pH: 6.0   (Normal Range:  5.0-8.0) Protein: trace   (Normal Range: Negative) Urobilinogen: 0.2   (Normal Range: 0-1) Nitrite: negative   (Normal Range: Negative) Leukocyte Esterace: negative   (Normal Range: Negative)

## 2010-08-02 NOTE — Miscellaneous (Signed)
[facility]   Clinical Lists Changes  Observations: Added new observation of FLU VAX: Historical (04/07/2010 14:28)      Immunization History:  Influenza Immunization History:    Influenza:  historical (04/07/2010)  Pt [facility].  Lowella Petties CMA  April 07, 2010 2:29 PM

## 2010-08-02 NOTE — Letter (Signed)
Summary: Results Follow up Letter  [place] [facility]  [address] [place], [place] [address]   Phone: [phone]  Fax: [phone]    10/27/2009 MRN: [id number]    Michele [name] [address] [place], [place]  [address]    Dear [name],  The following are the results of your recent test(s):  Test         Result    Pap Smear:        Normal _____  Not Normal _____ Comments: ______________________________________________________ Cholesterol: LDL(Bad cholesterol):         Your goal is less than:         HDL (Good cholesterol):       Your goal is more than: Comments:  ______________________________________________________ Mammogram:        Normal __X___  Not Normal _____ Comments:Please repeat mammogram in one year.  ___________________________________________________________________ Hemoccult:        Normal _____  Not normal _______ Comments:    _____________________________________________________________________ Other Tests:    We routinely do not discuss normal results over the telephone.  If you desire a copy of the results, or you have any questions about this information we can discuss them at your next office visit.   Sincerely,    Idamae Schuller Tower,MD  MT/ri

## 2010-08-02 NOTE — Progress Notes (Signed)
Summary: Yeast infection  Phone Note Call from Patient Call back at [phone]   Caller: Patient Call For: Judith Part MD Summary of Call: Requests a Rx. for yeast infection.  Says she took the ABX that you gave her and now she has yeast.     Also requests the cream that you once gave her for hemorrhoids.   [facility] [address] Initial call taken by: Delilah Shan CMA Duncan Dull),  July 09, 2009 1:07 PM  Follow-up for Phone Call        px written on EMR for call in  let me know if not imp Follow-up by: Judith Part MD,  July 09, 2009 1:33 PM  Additional Follow-up for Phone Call Additional follow up Details #1::        Meds called to [facility]. Additional Follow-up by: Lowella Petties CMA,  July 09, 2009 2:41 PM    New/Updated Medications: DIFLUCAN 150 MG TABS (FLUCONAZOLE) 1 by mouth times one for yeast Prescriptions: DIFLUCAN 150 MG TABS (FLUCONAZOLE) 1 by mouth times one for yeast  #1 x 0   Entered and Authorized by:   Judith Part MD   Signed by:   Lowella Petties CMA on 07/09/2009   Method used:   Telephoned to ...       [facility]       [address]       [place], [place]  [address]       Ph: [phone]       Fax: [phone]   RxID:   [id number] ANUSOL-HC 2.5 %  CREA (HYDROCORTISONE) apply to affected area once daily prn  #1 small x 0   Entered and Authorized by:   Judith Part MD   Signed by:   Lowella Petties CMA on 07/09/2009   Method used:   Telephoned to ...       [facility] [address] [facility]       [address]       [place], [place]  [address]       Ph: [phone]       Fax: [phone]   RxID:   [id number]

## 2010-08-03 HISTORY — PX: ESOPHAGOGASTRODUODENOSCOPY: SHX1529

## 2010-08-04 ENCOUNTER — Encounter: Payer: Self-pay | Admitting: Family Medicine

## 2010-08-04 NOTE — Letter (Signed)
Summary: Results Follow up Letter  [place] at [facility]  [address] [place], [place] [address]   Phone: [phone]  Fax: [phone]    07/26/2010 MRN: [id number]  Michele [name] [address] [place], [place]  [address]  Dear Ms. Casados,  The following are the results of your recent test(s):  Test         Result    Pap Smear:        Normal __X___  Not Normal _____ Comments: ______________________________________________________ Cholesterol: LDL(Bad cholesterol):         Your goal is less than:         HDL (Good cholesterol):       Your goal is more than: Comments:  ______________________________________________________ Mammogram:        Normal _____  Not Normal _____ Comments:  ___________________________________________________________________ Hemoccult:        Normal _____  Not normal _______ Comments:    _____________________________________________________________________ Other Tests:    We routinely do not discuss normal results over the telephone.  If you desire a copy of the results, or you have any questions about this information we can discuss them at your next office visit.   Sincerely,       Sharilyn Sites for Dr. [name]

## 2010-08-04 NOTE — Progress Notes (Signed)
Summary: wants diflucan  Phone Note Call from Patient Call back at Home Phone [phone]   Caller: Patient Call For: Judith Part MD Summary of Call: Pt is asking for diflucan for a yeast infection.  She had one a few weeks back but now has another.  Has itching, discharge, irritation.  [facility]. Initial call taken by: Lowella Petties CMA, AAMA,  June 29, 2010 12:58 PM  Follow-up for Phone Call        can do diflucan one time - but if not resolved f/u px written on EMR for call in  Follow-up by: Judith Part MD,  June 29, 2010 1:29 PM  Additional Follow-up for Phone Call Additional follow up Details #1::        [facility] sent to [facility] as directed. Patient aware to follow up if no resolution. Additional Follow-up by: Janee Morn CMA (AAMA),  June 29, 2010 1:50 PM    New/Updated Medications: DIFLUCAN 150 MG TABS (FLUCONAZOLE) 1 by mouth times one for yeast infection Prescriptions: DIFLUCAN 150 MG TABS (FLUCONAZOLE) 1 by mouth times one for yeast infection  #1 x 0   Entered by:   Janee Morn CMA (AAMA)   Authorized by:   Judith Part MD   Signed by:   Selena Batten Dance CMA (AAMA) on 06/29/2010   Method used:   Electronically to        [facility] (retail)       [address]       [place], [place]  [address]       Ph: [phone]       Fax: [phone]   RxID:   [id number]

## 2010-08-04 NOTE — Assessment & Plan Note (Signed)
Summary: ? YEAST INFECTION   Vital Signs:  Patient profile:   45 year old female Height:      68 inches Weight:      195 pounds BMI:     29.76 Temp:     97.9 degrees F oral Pulse rate:   76 / minute Pulse rhythm:   regular BP sitting:   124 / 78  (left arm) Cuff size:   regular  Vitals Entered By: [name] LPN (July 06, 2010 9:10 AM) CC: ? yeast infection, mostly perineal itching, frequency of urine   History of Present Illness: started with symptoms -- a few months ago   first episode -- itching and discharge and discomfort -- skin swelling and splitting  after a course of abx then diflucan cleared it up right away   2nd episode 3-4 weeks later (no abx)  same symptoms - itching and burning , some d/c and splitting of skin (from scratching)  more on outside  d/c is white in color -- with some odor   now - itching is still present  d/c is improved  took one diflucan at end of dec (helped a little)   no worries about stds no chance pregnant   peroids are heavier and wants to get on OC   no gyn appt recently  has endometriosis  has pap upcoming 17th month here   is sexually active - no birth control -- odds are slim  one of her tubes is blocked      Allergies: 1)  ! * Telfa Pads 2)  ! Darvocet 3)  ! Nsaids 4)  Betadine  Past History:  Past Medical History: Last updated: 11/15/2007 Anxiety Asthma Hyperlipidemia tachycardia s/p neg cardiac wu with echo IBS endometriosis 09 ? L rot cuff tear  syncope  Past Surgical History: Last updated: 10/08/2007 abn pap cryotx uterine polyps, hysteroscopy 99 EGD 99 colonoscopy, small polyps, hemorrhoids carpal tunnel sx 5/05 abd [place] neg laparoscopy - 06 with endometriosis  Family History: Last updated: 11/04/2007 father Barrett's esophagus, HTN, DM  mother CVA,  migraine,, high chol  sister DM sister chol  Social History: Last updated: 02/11/2008 Former Smoker occ alcohol   Risk  Factors: Smoking Status: quit (10/30/2006)  Review of Systems General:  Denies chills, fatigue, fever, and loss of appetite. Eyes:  Denies blurring, discharge, and eye irritation. CV:  Denies chest pain or discomfort and palpitations. Resp:  Denies shortness of breath. GI:  Denies change in bowel habits, indigestion, nausea, and vomiting. GU:  Complains of dysuria; denies discharge, genital sores, hematuria, and urinary frequency. MS:  Denies cramps. Derm:  Complains of itching; denies rash. Neuro:  Denies numbness and tingling. Heme:  Denies abnormal bruising and bleeding.  Physical Exam  General:  overweight but generally well appearing VSS, non toxic appearing  Head:  normocephalic, atraumatic, no abnormalities observed, and no abnormalities palpated.   Eyes:  vision grossly intact, pupils equal, pupils round, pupils reactive to light, and no injection.   Mouth:  pharynx pink and moist.   Neck:  No deformities, masses, or tenderness noted. Lungs:  Normal respiratory effort, chest expands symmetrically. Lungs are clear to auscultation, no crackles or wheezes. Heart:  RRR  Abdomen:  no suprapubic tenderness or fullness felt  Genitalia:  some mild swelling of labia with few excoriations no d/c  wet prep done Skin:  Intact without suspicious lesions or rashes Inguinal Nodes:  No significant adenopathy Psych:  normal affect, talkative and pleasant  Impression & Recommendations:  Problem # 1:  VAGINITIS, CANDIDAL (ICD-112.1) Assessment New  persistant and somewhat but not totally resp to one dose of diflucan orally will tx with 3 doses (1-2 days apart) and also terazol cream pt advised to update me if symptoms worsen or do not improve  f/u for gyn exam on 17th as planned  if frequent urination does not improve - will disc further then Her updated medication list for this problem includes:    Diflucan 150 Mg Tabs (Fluconazole) .Marland Kitchen... 1 by mouth today, one friday and one  sunday  Orders: T-Urine Microscopic (81015-65010) Wet Prep (87210QW) Urine Pregnancy Test  (81025) Prescription Created Electronically (G8553)  Problem # 2:  FREQUENCY, URINARY (ICD-788.41) Assessment: New  see above clear ua  disc at f/u  Orders: T-Urine Microscopic (81015-65010) Wet Prep (87210QW) Urine Pregnancy Test  (81025)  Complete Medication List: 1)  Singulair 10 Mg Tabs (Montelukast sodium) .... Take one by mouth at bedtime 2)  Fluoxetine Hcl 40 Mg Caps (Fluoxetine hcl) .... Take one by mouth every evening 3)  Anusol-hc 2.5 % Crea (Hydrocortisone) .... Apply to affected area once daily as needed 4)  Omega 3  .... Daily 5)  Vitamin C  .... Daily 6)  Calcium  .... Daily 7)  Nexium 40 Mg Cpdr (Esomeprazole magnesium) .... 1 by mouth each am 8)  Flonase 50 Mcg/act Susp (Fluticasone propionate) .... 2 sprays in each nostril once daily as needed 9)  Proair Hfa 108 (90 Base) Mcg/act Aers (Albuterol sulfate) .... 2 puffs up to every 4 hours as needed wheezing 10)  Diflucan 150 Mg Tabs (Fluconazole) .... 1 by mouth today, one friday and one sunday 11)  Cyclobenzaprine Hcl 10 Mg Tabs (Cyclobenzaprine hcl) .... 1 by mouth 2 times daily as needed for back pain 12)  Terazol 3 0.8 % Crea (Terconazole) .... Apply to affected area once daily as needed  Patient Instructions: 1)  keep area clean and dry  2)  use cream daily as needed 3)  take diflucan as directed 4)  if not much improved in 1 week let me know  5)  follow up for your gyn exam as planned  Prescriptions: TERAZOL 3 0.8 % CREA (TERCONAZOLE) apply to affected area once daily as needed  #1 course x 0   Entered and Authorized by:   Marne Ann Tower MD   Signed by:   Marne Ann Tower MD on 07/06/2010   Method used:   [facility] to        [facility]  [place] [id number] (retail[facility]       [address]       [place], [place]  [address]       Ph: [phone] or [phone]       Fax: [phone]   RxID:    [id number] DIFLUCAN 150 MG TABS (FLUCONAZOLE) 1 by mouth today, one friday and one sunday  #3 x 0   Entered and Authorized by:   Marne Ann Tower MD   Signed by:   Marne Ann Tower MD on 07/06/2010   Method used:   [facility]        [facility]  [place] [id number]       [address]       [place], [place]  [address]       Ph: [phone] or [phone]       Fax: [phone]   RxID:   [id number]    Orders Added: 1)  T-Urine Microscopic [  [address]] 2)  Wet Prep [09811BJ] 3)  Urine Pregnancy Test  [81025] 4)  Est. Patient Level IV [47829] 5)  Prescription Created Electronically [phone]    Current Allergies (reviewed today): ! * TELFA PADS ! DARVOCET ! NSAIDS BETADINE  Laboratory Results   Urine Tests  Date/Time Received: July 06, 2010 9:12 AM  Date/Time Reported: July 06, 2010 9:12 AM   Routine Urinalysis   Color: yellow Appearance: Clear Glucose: negative   (Normal Range: Negative) Bilirubin: negative   (Normal Range: Negative) Ketone: negative   (Normal Range: Negative) Spec. Gravity: 1.010   (Normal Range: 1.003-1.035) Blood: trace-lysed   (Normal Range: Negative) pH: 6.0   (Normal Range: 5.0-8.0) Protein: negative   (Normal Range: Negative) Urobilinogen: 0.2   (Normal Range: 0-1) Nitrite: negative   (Normal Range: Negative) Leukocyte Esterace: negative   (Normal Range: Negative)  Urine Microscopic WBC/HPF: 0 RBC/HPF: 0 Bacteria/HPF: 0 Mucous/HPF: 0 Epithelial/HPF: 0-1 Crystals/HPF: few Casts/LPF: 0 Yeast/HPF: 0 Other: 0    Urine HCG: negative   Wet Mount Source: vaginal  WBC/hpf: 1-5 Bacteria/hpf: rare  Rods Clue cells/hpf: none  Negative whiff Yeast/hpf: moderate Wet Mount KOH: Negative Trichomonas/hpf: none   Laboratory Results   Urine Tests    Routine Urinalysis   Color: yellow Appearance: Clear Glucose: negative   (Normal Range: Negative) Bilirubin: negative   (Normal Range:  Negative) Ketone: negative   (Normal Range: Negative) Spec. Gravity: 1.010   (Normal Range: 1.003-1.035) Blood: trace-lysed   (Normal Range: Negative) pH: 6.0   (Normal Range: 5.0-8.0) Protein: negative   (Normal Range: Negative) Urobilinogen: 0.2   (Normal Range: 0-1) Nitrite: negative   (Normal Range: Negative) Leukocyte Esterace: negative   (Normal Range: Negative)  Urine Microscopic WBC/hpf: 0 RBC/hpf: 0 Bacteria: 0 Mucous: 0 Epithelial: 0-1 Crystals/LPF: few Casts/LPF: 0 Yeast/HPF: 0 Other: 0    Urine HCG: negative   Wet Mount/KOH  Rods  Negative whiff KOH Negative

## 2010-08-04 NOTE — Miscellaneous (Signed)
Summary: PAP to flowsheet   Clinical Lists Changes  Observations: Added new observation of PAP SMEAR: normal (07/19/2010 9:28)      Preventive Care Screening  Pap Smear:    Date:  07/19/2010    Results:  normal

## 2010-08-04 NOTE — Progress Notes (Signed)
----   Converted from flag ---- ---- 07/09/2010 5:04 PM, Judith Part MD wrote: please check wellness and lipid / v70.0 and 272 thanks   ---- 07/07/2010 1:13 PM, Liane Comber CMA (AAMA) wrote: Lab orders please! Good Morning! This pt is scheduled for cpx labs Wed, which labs to draw and dx codes to use? Thanks Tasha ------------------------------

## 2010-08-04 NOTE — Assessment & Plan Note (Signed)
Summary: CPX/W/PAP/RBH   Vital Signs:  Patient profile:   45 year old female Height:      68 inches Weight:      195.25 pounds BMI:     29.79 Temp:     97.4 degrees F oral Pulse rate:   74 / minute Pulse rhythm:   regular BP sitting:   108 / 70  (left arm) Cuff size:   large  Vitals Entered By: Selena Batten Dance CMA Duncan Dull) (July 19, 2010 10:42 AM) CC: CPx w/pap   History of Present Illness: is here for wellness exam/ new anemia and to review chronic health problems  wt is stable with 29 bmi  good bp 108/70  hx of abn paps and uterine polyps  pap- has been a few years-- some abn in the past   mam 4/11-- nl  self exam-- no lumps or changes   Td 05 utd imms  colonosc 99- hyperplastic polyps  has had a little blood in stool in past --but not recently  no abd pain  occ a little nausea   no strict diet - eats red meat and green veg    hb is 8.4-- new finding  never had anemia before  her menses are moderately heavy -- lasting about a week , and some spotting in between  was interested in OC in the future  changes tampon 2 times per day - not as it used to be does pass clots  no family hx of anemia   lipids fair with tirg 117 and HDL 40 and LDL 135- not on med diet is overall quite healthy   Allergies: 1)  ! * Telfa Pads 2)  ! Darvocet 3)  ! Nsaids 4)  Betadine  Past History:  Past Medical History: Last updated: 11/15/2007 Anxiety Asthma Hyperlipidemia tachycardia s/p neg cardiac wu with echo IBS endometriosis 09 ? L rot cuff tear  syncope  Past Surgical History: Last updated: 10/08/2007 abn pap cryotx uterine polyps, hysteroscopy 99 EGD 99 colonoscopy, small polyps, hemorrhoids carpal tunnel sx 5/05 abd [place] neg laparoscopy - 06 with endometriosis  Family History: Last updated: 11/04/2007 father Barrett's esophagus, HTN, DM  mother CVA,  migraine,, high chol  sister DM sister chol  Social History: Last updated: 02/11/2008 Former  Smoker occ alcohol   Risk Factors: Smoking Status: quit (10/30/2006)  Review of Systems General:  Complains of fatigue; denies chills, fever, loss of appetite, and malaise. Eyes:  Denies blurring and eye irritation. CV:  Denies chest pain or discomfort, palpitations, shortness of breath with exertion, and swelling of feet. Resp:  Denies cough and shortness of breath. GI:  Denies abdominal pain, change in bowel habits, and nausea. GU:  Denies hematuria. MS:  Denies joint swelling and stiffness. Derm:  Denies itching, lesion(s), poor wound healing, and rash. Neuro:  Denies numbness and tingling. Psych:  Denies anxiety and depression; is always stressed . Endo:  Denies cold intolerance, excessive thirst, excessive urination, and heat intolerance. Heme:  Complains of pallor; denies abnormal bruising, bleeding, enlarge lymph nodes, and skin discoloration.  Physical Exam  General:  overweight but generally well appearing  Head:  normocephalic, atraumatic, no abnormalities observed, and no abnormalities palpated.   Eyes:  vision grossly intact, pupils equal, pupils round, and pupils reactive to light.  mild conj pallor Ears:  R ear normal and L ear normal.   Nose:  no nasal discharge.   Mouth:  pharynx pink and moist.   Neck:  supple with full  rom and no masses or thyromegally, no JVD or carotid bruit  Chest Wall:  No deformities, masses, or tenderness noted. Breasts:  No mass, nodules, thickening, tenderness, bulging, retraction, inflamation, nipple discharge or skin changes noted.   Lungs:  Normal respiratory effort, chest expands symmetrically. Lungs are clear to auscultation, no crackles or wheezes. Heart:  RRR - no M noted Abdomen:  Bowel sounds positive,abdomen soft and non-tender without masses, organomegaly or hernias noted. no renal bruits  Genitalia:  Normal introitus for age, no external lesions, no vaginal discharge, mucosa pink and moist, no vaginal or cervical lesions, no  vaginal atrophy, no friaility or hemorrhage, normal uterus size and position, no adnexal masses or tenderness Msk:  No deformity or scoliosis noted of thoracic or lumbar spine.   Pulses:  R and L carotid,radial,femoral,dorsalis pedis and posterior tibial pulses are full and equal bilaterally Extremities:  no CCE Neurologic:  DTRs symmetrical and normal.   Skin:  fair / mild pallor  no rash Cervical Nodes:  No lymphadenopathy noted Axillary Nodes:  No palpable lymphadenopathy Inguinal Nodes:  No significant adenopathy Psych:  normal affect, talkative and pleasant  seems mildly fatigued    Impression & Recommendations:  Problem # 1:  HEALTH MAINTENANCE EXAM (ICD-V70.0) Assessment Comment Only reviewed health habits including diet, exercise and skin cancer prevention reviewed health maintenance list and family history labs reviewed   Problem # 2:  ROUTINE GYNECOLOGICAL EXAMINATION (ICD-V72.31) Assessment: Comment Only per pt some painful peroids - small fibroid in past did start her on OC - loestrin will update disc way to take it  ? if menses alone can be blamed for anemia   Problem # 3:  VAGINITIS, CANDIDAL (ICD-112.1) Assessment: Improved this seems to be resolved  pend pap report The following medications were removed from the medication list:    Diflucan 150 Mg Tabs (Fluconazole) .Marland Kitchen... 1 by mouth today, one friday and one sunday  Problem # 4:  HYPERLIPIDEMIA (ICD-272.4) Assessment: Unchanged  chol in fair control with diet disc goal of LDL under 130 disc low sat fat diet   Labs Reviewed: SGOT: 17 (07/13/2010)   SGPT: 14 (07/13/2010)  Lipid Goals: Chol Goal: 200 (08/25/2008)   HDL Goal: 40 (08/25/2008)   LDL Goal: 160 (08/25/2008)   TG Goal: 150 (08/25/2008)  Prior 10 Yr Risk Heart Disease: 4 % (08/25/2008)   HDL:40.70 (07/13/2010), 41.7 (02/04/2008)  LDL:135 (07/13/2010), DEL (02/04/2008)  Chol:199 (07/13/2010), 261 (02/04/2008)  Trig:117.0 (07/13/2010), 176  (02/04/2008)  Orders: Prescription Created Electronically [phone])  Problem # 5:  ANEMIA (ICD-285.9) Assessment: New new marked anemia-- with some fatigue and exercise intol ? from menses or GI loss iron studies today start OC today  will likely ref to GI if low iron and start on iron  Orders: Venipuncture (41324) TLB-B12 + Folate Pnl (82746_82607-B12/FOL) TLB-Iron, (Fe) Total (83540-FE) TLB-Ferritin (82728-FER)  Problem # 6:  ANXIETY (ICD-300.00) Assessment: Unchanged  refil fluoxetine- doing well on this  Her updated medication list for this problem includes:    Fluoxetine Hcl 40 Mg Caps (Fluoxetine hcl) .Marland Kitchen... Take one by mouth every evening  Orders: Prescription Created Electronically [phone])  Complete Medication List: 1)  Singulair 10 Mg Tabs (Montelukast sodium) .... Take one by mouth at bedtime 2)  Fluoxetine Hcl 40 Mg Caps (Fluoxetine hcl) .... Take one by mouth every evening 3)  Anusol-hc 2.5 % Crea (Hydrocortisone) .... Apply to affected area once daily as needed 4)  Omega 3  .... Daily 5)  Vitamin  C  .... Daily 6)  Calcium  .... Daily 7)  Nexium 40 Mg Cpdr (Esomeprazole magnesium) .Marland Kitchen.. 1 by mouth each am 8)  Flonase 50 Mcg/act Susp (Fluticasone propionate) .... 2 sprays in each nostril once daily as needed 9)  Proair Hfa 108 (90 Base) Mcg/act Aers (Albuterol sulfate) .... 2 puffs up to every 4 hours as needed wheezing 10)  Cyclobenzaprine Hcl 10 Mg Tabs (Cyclobenzaprine hcl) .Marland Kitchen.. 1 by mouth 2 times daily as needed for back pain 11)  Loestrin Fe 1.5/30 1.5-30 Mg-mcg Tabs (Norethin ace-eth estrad-fe) .Marland Kitchen.. 1 by mouth once daily as directed  Other Orders: Radiology Referral (Radiology)  Patient Instructions: 1)  labs today for iron studies  -- we will likely refer you to GI when I get results and start you on iron  2)  we will schedule mammogram in april  3)  start oral contraceptive for peroid control now -- loestrin  4)  eat dark green vegetables - a little red  meat is ok  5)  otherwise watch cholesterol in diet  6)  meds were sent to pharmacy  Prescriptions: FLUOXETINE HCL 40 MG CAPS (FLUOXETINE HCL) Take one by mouth every evening  #30 x 11   Entered and Authorized by:   Judith Part MD   Signed by:   Judith Part MD on 07/19/2010   Method used:   Electronically to        [facility] (retail)       [address]       [place], [place]  [address]       Ph: [phone]       Fax: [phone]   RxID:   [id number] LOESTRIN FE 1.5/30 1.5-30 MG-MCG TABS (NORETHIN ACE-ETH ESTRAD-FE) 1 by mouth once daily as directed  #1 pack x 11   Entered and Authorized by:   Judith Part MD   Signed by:   Judith Part MD on 07/19/2010   Method used:   Electronically to        [facility]       [address]       [place], [place]  [address]       Ph: [phone]       Fax: [phone]   RxID:   [id number]    Orders Added: 1)  Venipuncture [62694] 2)  TLB-B12 + Folate Pnl [82746_82607-B12/FOL] 3)  TLB-Iron, (Fe) Total [83540-FE] 4)  TLB-Ferritin [85462-VOJ] 5)  Radiology Referral [Radiology] 6)  Prescription Created Electronically [G8553] 7)  Est. Patient 40-64 years [99396] 8)  Est. Patient Level II [50093]    Current Allergies (reviewed today): ! * TELFA PADS ! DARVOCET ! NSAIDS BETADINE

## 2010-08-05 ENCOUNTER — Other Ambulatory Visit: Payer: Self-pay | Admitting: Family Medicine

## 2010-08-05 ENCOUNTER — Other Ambulatory Visit (INDEPENDENT_AMBULATORY_CARE_PROVIDER_SITE_OTHER): Payer: PRIVATE HEALTH INSURANCE

## 2010-08-05 ENCOUNTER — Encounter (INDEPENDENT_AMBULATORY_CARE_PROVIDER_SITE_OTHER): Payer: Self-pay | Admitting: *Deleted

## 2010-08-05 ENCOUNTER — Ambulatory Visit: Admit: 2010-08-05 | Payer: Self-pay | Admitting: Family Medicine

## 2010-08-05 DIAGNOSIS — D649 Anemia, unspecified: Secondary | ICD-10-CM

## 2010-08-05 LAB — CBC WITH DIFFERENTIAL/PLATELET
Basophils Absolute: 0 10*3/uL (ref 0.0–0.1)
Basophils Relative: 0.5 % (ref 0.0–3.0)
Eosinophils Absolute: 0.2 10*3/uL (ref 0.0–0.7)
Eosinophils Relative: 2.6 % (ref 0.0–5.0)
HCT: 26.7 % — ABNORMAL LOW (ref 36.0–46.0)
Hemoglobin: 8.3 g/dL — ABNORMAL LOW (ref 12.0–15.0)
Lymphocytes Relative: 29.9 % (ref 12.0–46.0)
Lymphs Abs: 2.3 10*3/uL (ref 0.7–4.0)
MCHC: 31.1 g/dL (ref 30.0–36.0)
MCV: 67 fl — ABNORMAL LOW (ref 78.0–100.0)
Monocytes Absolute: 0.4 10*3/uL (ref 0.1–1.0)
Monocytes Relative: 5.7 % (ref 3.0–12.0)
Neutro Abs: 4.7 10*3/uL (ref 1.4–7.7)
Neutrophils Relative %: 61.3 % (ref 43.0–77.0)
Platelets: 387 10*3/uL (ref 150.0–400.0)
RBC: 3.98 Mil/uL (ref 3.87–5.11)
RDW: 18.8 % — ABNORMAL HIGH (ref 11.5–14.6)
WBC: 7.7 10*3/uL (ref 4.5–10.5)

## 2010-08-11 ENCOUNTER — Encounter: Payer: Self-pay | Admitting: Family Medicine

## 2010-08-12 ENCOUNTER — Encounter: Payer: Self-pay | Admitting: Family Medicine

## 2010-08-17 ENCOUNTER — Encounter: Payer: Self-pay | Admitting: Family Medicine

## 2010-08-18 ENCOUNTER — Telehealth: Payer: Self-pay | Admitting: Family Medicine

## 2010-08-18 NOTE — Letter (Signed)
Summary: [facility]  [facility]   Imported By: Kassie Mends 08/10/2010 11:51:40  _____________________________________________________________________  External Attachment:    Type:   Image     Comment:   External Document

## 2010-08-23 ENCOUNTER — Telehealth: Payer: Self-pay | Admitting: Family Medicine

## 2010-08-24 NOTE — Progress Notes (Signed)
Summary: Fluconazole       New/Updated Medications: FLUCONAZOLE 150 MG TABS (FLUCONAZOLE)

## 2010-08-24 NOTE — Progress Notes (Signed)
Summary: Terconazole Cream and Fluconazole  Phone Note Refill Request Message from:  Fax from Pharmacy on August 18, 2010 2:40 PM  Refills Requested: Medication #1:  TERCONAZOLE 0.8 % CREA.  Medication #2:  FLUCONAZOLE 150 MG TABS [facility]  Fax:   [phone]  These were not on the meds list, added for refill purposes.   Method Requested: Fax to Mail Away Pharmacy Initial call taken by: Delilah Shan CMA Duncan Dull),  August 18, 2010 2:40 PM  Follow-up for Phone Call        please call her to ask about these- ? what for ? from [place] or another doc Follow-up by: Judith Part MD,  August 18, 2010 3:16 PM  Additional Follow-up for Phone Call Additional follow up Details #1::        Called and left message for patient to return my call.  Melody Comas  August 19, 2010 10:45 AM  Patient states she does not need either of these, please disregard.  Delilah Shan CMA [facility] Dull)  August 19, 2010 3:23 PM

## 2010-08-25 ENCOUNTER — Ambulatory Visit: Payer: 59 | Admitting: Family Medicine

## 2010-08-26 ENCOUNTER — Telehealth: Payer: Self-pay | Admitting: Family Medicine

## 2010-08-26 ENCOUNTER — Encounter: Payer: Self-pay | Admitting: Family Medicine

## 2010-08-26 ENCOUNTER — Ambulatory Visit (INDEPENDENT_AMBULATORY_CARE_PROVIDER_SITE_OTHER): Payer: PRIVATE HEALTH INSURANCE | Admitting: Family Medicine

## 2010-08-26 ENCOUNTER — Other Ambulatory Visit: Payer: Self-pay | Admitting: Family Medicine

## 2010-08-26 DIAGNOSIS — D649 Anemia, unspecified: Secondary | ICD-10-CM

## 2010-08-26 DIAGNOSIS — K219 Gastro-esophageal reflux disease without esophagitis: Secondary | ICD-10-CM | POA: Insufficient documentation

## 2010-08-26 DIAGNOSIS — N809 Endometriosis, unspecified: Secondary | ICD-10-CM

## 2010-08-26 LAB — CBC WITH DIFFERENTIAL/PLATELET
Basophils Absolute: 0 10*3/uL (ref 0.0–0.1)
Basophils Relative: 0.6 % (ref 0.0–3.0)
Eosinophils Absolute: 0.1 10*3/uL (ref 0.0–0.7)
HCT: 27.6 % — ABNORMAL LOW (ref 36.0–46.0)
Hemoglobin: 8.8 g/dL — ABNORMAL LOW (ref 12.0–15.0)
Lymphs Abs: 1.4 10*3/uL (ref 0.7–4.0)
MCHC: 31.8 g/dL (ref 30.0–36.0)
Neutro Abs: 5 10*3/uL (ref 1.4–7.7)
RBC: 4.1 Mil/uL (ref 3.87–5.11)
RDW: 18.6 % — ABNORMAL HIGH (ref 11.5–14.6)

## 2010-08-30 NOTE — Progress Notes (Signed)
Summary: Pro air and Nexium  Phone Note Refill Request Call back at [phone] and fax [phone] Message from:  [facility] on August 26, 2010 4:35 PM  Refills Requested: Medication #1:  NEXIUM 40 MG CPDR 1 by mouth each am  Medication #2:  PROAIR HFA 108 (90 BASE) MCG/ACT AERS 2 puffs up to every 4 hours as needed wheezing [facility] called for 90 day prescription  and refills for 1 yr on Pro air HFA inhaler and Nexium 40mg .Please advise.    Method Requested: Fax to Mail Away Pharmacy Initial call taken by: [name] LPN,  August 26, 2010 4:36 PM  Follow-up for Phone Call        printed in put in nurse in box for pickup  or fax  Follow-up by: Judith Part MD,  August 26, 2010 5:18 PM  Additional Follow-up for Phone Call Additional follow up Details #1::        Rx faxed to [phone] as instructed.[name] LPN  August 26, 2010 5:24 PM

## 2010-08-30 NOTE — Procedures (Signed)
Summary: [facility]   Imported By: Beau Fanny 08/23/2010 10:31:36  _____________________________________________________________________  External Attachment:    Type:   Image     Comment:   External Document  Appended Document: [facility]     Clinical Lists Changes  Observations: Added new observation of PAST MED HX: Anxiety Asthma Hyperlipidemia tachycardia s/p neg cardiac wu with echo IBS endometriosis 09 ? L rot cuff tear  syncope Iron deficiency anemia --1/12 HH gastric polyp    [facility] (08/25/2010 18:19) Added new observation of PAST SURG HX: abn pap cryotx uterine polyps, hysteroscopy 99 EGD 99 colonoscopy, small polyps, hemorrhoids carpal tunnel sx 5/05 abd [place] neg laparoscopy - 06 with endometriosis EGD 2/12 gastric polyp and HH (08/25/2010 18:19)       Past Medical History:    Anxiety    Asthma    Hyperlipidemia    tachycardia s/p neg cardiac wu with echo    IBS    endometriosis    09 ? L rot cuff tear     syncope    Iron deficiency anemia --1/12    HH    gastric polyp                [facility]  Past Surgical History:    abn pap cryotx    uterine polyps, hysteroscopy    99 EGD    99 colonoscopy, small polyps, hemorrhoids    carpal tunnel sx    5/05 abd [place] neg    laparoscopy - 06 with endometriosis    EGD 2/12 gastric polyp and HH

## 2010-08-30 NOTE — Progress Notes (Signed)
Summary: pharmacy requests new scripts  Phone Note Refill Request Message from:  Fax from Pharmacy  Refills Requested: Medication #1:  FLUOXETINE HCL 40 MG CAPS Take one by mouth every evening  Medication #2:  SINGULAIR 10 MG TABS Take one by mouth at bedtime  Medication #3:  TERCONAZOLE 0.8 % CREA. [facility] your [facility].  They are asking for new scripts.  Initial call taken by: Lowella Petties CMA, AAMA,  August 23, 2010 4:44 PM  Follow-up for Phone Call        forms say singulair and fluconazole and terconazole   the fluconazole and terconazole were acute meds - not chronic -- so not generally done mail order  please clarify with her  fluoxetine was not in the batch of forms that a saw  Follow-up by: Judith Part MD,  August 23, 2010 5:10 PM  Additional Follow-up for Phone Call Additional follow up Details #1::        These came in last week.  (See 08/18/2010 note) I spoke with Caden and she does not need the Fluconazole and Teraconazole.  These were faxed back as denied.   Fluoxetine was faxed in on 08/18/2010.  I did not receive a request for Singulair.  Lugene Fuquay CMA [facility])  August 23, 2010 5:12 PM     Additional Follow-up for Phone Call Additional follow up Details #2::    I did the one for singulair in case she needs it  form done and in nurse in box  Follow-up by: Judith Part MD,  August 23, 2010 5:19 PM  New/Updated Medications: SINGULAIR 10 MG TABS (MONTELUKAST SODIUM) Take one by mouth at bedtime Prescriptions: SINGULAIR 10 MG TABS (MONTELUKAST SODIUM) Take one by mouth at bedtime  #90 x 3   Entered by:   Delilah Shan CMA (AAMA)   Authorized by:   Judith Part MD   Signed by:   Delilah Shan CMA (AAMA) on 08/23/2010   Method used:   F[facility]ed to ...       [facility]       [address]       [place], [place]  [address]       Ph: [phone]       Fax: [phone]   RxID:    [id number] SINGULAIR 10 MG TABS (MONTELUKAST SODIUM) Take one by mouth at bedtime  #90 x 3   Entered and Authorized by:   Judith Part MD   Signed by:   Judith Part MD on 08/23/2010   Method used:   Historical   RxID:   [id number]

## 2010-09-08 NOTE — Assessment & Plan Note (Signed)
Summary: f/u on colonoscopy jrt   Vital Signs:  Patient profile:   45 year old female Height:      68 inches Weight:      190.50 pounds BMI:     29.07 Temp:     98.6 degrees F oral Pulse rate:   80 / minute Pulse rhythm:   regular BP sitting:   104 / 66  (left arm) Cuff size:   large  Vitals Entered By: [name] LPN (August 26, 2010 11:45 AM) CC: follow-up visit after colonoscopy   History of Present Illness: here forf/u of anemia  dx in jan with iron def anemia HB of 8.3 and low ferritin 2.8 with iron ferritin has inc to 6.7  taking nu iron two times a day 150-- some contipation from that  that gives her some stomach pain she had to inc to 3 daily   ? iron abs problem  father had anemia in the past -- with neg work up GI  seen dr Ewing Schlein   EGD showed HH and tiny polyp bx neg was going to w/u for celiac  that all came back ok   did colonoscopy - few polyps removed - pending path  hemorroids  otherwise ok  nothing to explain bleeding  ? considering f/u with CT scan   still spotting between periods  still pain with intercourse  has known fibroids  sharp pain during ovulation -- ? endometriosis  not bleeding heavy  last saw gyn a few years ago       Allergies: 1)  ! * Telfa Pads 2)  ! Darvocet 3)  ! Nsaids 4)  Betadine  Past History:  Past Surgical History: Last updated: 08/25/2010 abn pap cryotx uterine polyps, hysteroscopy 99 EGD 99 colonoscopy, small polyps, hemorrhoids carpal tunnel sx 5/05 abd [place] neg laparoscopy - 06 with endometriosis EGD 2/12 gastric polyp and HH  Family History: Last updated: 08/26/2010 father Barrett's esophagus, HTN, DM , iron def anemia  mother CVA,  migraine,, high chol  sister DM sister chol  Social History: Last updated: 02/11/2008 Former Smoker occ alcohol   Risk Factors: Smoking Status: quit (10/30/2006)  Past Medical History: Anxiety Asthma Hyperlipidemia tachycardia s/p neg cardiac wu with  echo IBS endometriosis 09 ? L rot cuff tear  syncope Iron deficiency anemia --1/12 HH gastric polyp   gyn -     [facility]- [facility]  Family History: father Barrett's esophagus, HTN, DM , iron def anemia  mother CVA,  migraine,, high chol  sister DM sister chol  Review of Systems General:  Complains of fatigue; denies chills, fever, and loss of appetite. Eyes:  Denies blurring and eye irritation. CV:  Denies chest pain or discomfort, lightheadness, palpitations, and shortness of breath with exertion. Resp:  Denies cough, shortness of breath, and wheezing. GI:  Denies abdominal pain, bloody stools, change in bowel habits, and nausea. MS:  Denies cramps and stiffness; no RLS symptoms. Derm:  Denies itching, lesion(s), poor wound healing, and rash. Neuro:  Denies numbness and tingling. Psych:  Denies anxiety and depression. Endo:  Denies cold intolerance, excessive thirst, excessive urination, and heat intolerance. Heme:  Denies abnormal bruising and bleeding.  Physical Exam  General:  overweight but generally well appearing  Head:  normocephalic, atraumatic, and no abnormalities observed.   Eyes:  vision grossly intact, pupils equal, pupils round, and pupils reactive to light.  no conjunctival pallor, injection or icterus  Mouth:  pharynx pink and moist.   Neck:  supple with full rom and no masses or thyromegally, no JVD or carotid bruit  Chest Wall:  No deformities, masses, or tenderness noted. Lungs:  Normal respiratory effort, chest expands symmetrically. Lungs are clear to auscultation, no crackles or wheezes. Heart:  RRR - no M noted Abdomen:  Bowel sounds positive,abdomen soft and non-tender without masses, organomegaly or hernias noted. no renal bruits  Extremities:  no CCE Neurologic:  DTRs symmetrical and normal.   Skin:  fair / mild pallor  no rash Cervical Nodes:  No lymphadenopathy noted Inguinal Nodes:  No significant adenopathy Psych:  normal affect, talkative and  pleasant    Impression & Recommendations:  Problem # 1:  ANEMIA (ICD-285.9) Assessment Unchanged iron def with inc in ferritin but not hb on nu iron two times a day  has since been inc to three times a day  reviewed EGD and bx reports and GI notes with pt in detail today no gi source of loss via endoscopy - rev egd and path / pt reported her colonosc with dr Claretha Cooper  she will see her gyn re: menses (pt states not bad)  if no imp in cbc today will plan on ref to heme to investigate poss iron absorption abn (of interest father had similar problem) Her updated medication list for this problem includes:    Nu-iron 150 Mg Caps (Polysaccharide iron complex) .Marland Kitchen... 1 by mouth three times a day  Orders: Venipuncture (66440) TLB-CBC Platelet - w/Differential (85025-CBCD) Prescription Created Electronically [phone])  Problem # 2:  ENDOMETRIOSIS (ICD-617.9) Assessment: Unchanged pt states menses are not too bad at this point - but idd encourage her to address anemia with her gyn - she will call for appt  Complete Medication List: 1)  Singulair 10 Mg Tabs (Montelukast sodium) .... Take one by mouth at bedtime 2)  Fluoxetine Hcl 40 Mg Caps (Fluoxetine hcl) .... Take one by mouth every evening 3)  Anusol-hc 2.5 % Crea (Hydrocortisone) .... Apply to affected area once daily as needed 4)  Nexium 40 Mg Cpdr (Esomeprazole magnesium) .Marland Kitchen.. 1 by mouth each am 5)  Flonase 50 Mcg/act Susp (Fluticasone propionate) .... 2 sprays in each nostril once daily as needed 6)  Proair Hfa 108 (90 Base) Mcg/act Aers (Albuterol sulfate) .... 2 puffs up to every 4 hours as needed wheezing 7)  Loestrin Fe 1.5/30 1.5-30 Mg-mcg Tabs (Norethin ace-eth estrad-fe) .Marland Kitchen.. 1 by mouth once daily as directed 8)  Nu-iron 150 Mg Caps (Polysaccharide iron complex) .Marland Kitchen.. 1 by mouth three times a day  Patient Instructions: 1)  please continue nu iron three times a day  2)  labs today 3)  get appt with your gyn  4)  if no improvement I  would consider hematology referral  Prescriptions: PROAIR HFA 108 (90 BASE) MCG/ACT AERS (ALBUTEROL SULFATE) 2 puffs up to every 4 hours as needed wheezing  #3 mdi x 3   Entered and Authorized by:   Judith Part MD   Signed by:   Judith Part MD on 08/26/2010   Method used:   Printed then faxed to ...       [facility] (retail)       [address]       [place], [place]  [address]       Ph: [phone]       Fax: [phone]   RxID:   [id number] NEXIUM 40 MG CPDR (ESOMEPRAZOLE MAGNESIUM) 1  by mouth each am  #90 x 3   Entered and Authorized by:   Judith Part MD   Signed by:   Judith Part MD on 08/26/2010   Method used:   Printed then faxed to ...       [facility] (retail)       [address]       [place], [place]  [address]       Ph: [phone]       Fax: [phone]   RxID:   [id number] NU-IRON 150 MG CAPS (POLYSACCHARIDE IRON COMPLEX) 1 by mouth three times a day  #90 x 5   Entered and Authorized by:   Judith Part MD   Signed by:   Judith Part MD on 08/26/2010   Method used:   Electronically to        [facility] (retail)       [address]       [place], [place]  [address]       Ph: [phone]       Fax: [phone]   RxID:   [id number]    Orders Added: 1)  Venipuncture [06301] 2)  TLB-CBC Platelet - w/Differential [85025-CBCD] 3)  Est. Patient Level IV [60109] 4)  Prescription Created Electronically [N2355]    Current Allergies (reviewed today): ! * TELFA PADS ! DARVOCET ! NSAIDS BETADINE

## 2010-09-13 NOTE — Procedures (Signed)
Summary: Colonoscopy  Colonoscopy   Imported By: Kassie Mends 09/05/2010 08:15:11  _____________________________________________________________________  External Attachment:    Type:   Image     Comment:   External Document

## 2010-09-14 ENCOUNTER — Telehealth: Payer: Self-pay | Admitting: Family Medicine

## 2010-09-20 ENCOUNTER — Encounter (HOSPITAL_BASED_OUTPATIENT_CLINIC_OR_DEPARTMENT_OTHER): Payer: PRIVATE HEALTH INSURANCE | Admitting: Oncology

## 2010-09-20 ENCOUNTER — Other Ambulatory Visit: Payer: Self-pay | Admitting: Oncology

## 2010-09-20 DIAGNOSIS — D509 Iron deficiency anemia, unspecified: Secondary | ICD-10-CM

## 2010-09-20 DIAGNOSIS — F411 Generalized anxiety disorder: Secondary | ICD-10-CM

## 2010-09-20 DIAGNOSIS — D259 Leiomyoma of uterus, unspecified: Secondary | ICD-10-CM

## 2010-09-20 DIAGNOSIS — D649 Anemia, unspecified: Secondary | ICD-10-CM

## 2010-09-20 DIAGNOSIS — K449 Diaphragmatic hernia without obstruction or gangrene: Secondary | ICD-10-CM

## 2010-09-20 LAB — CBC & DIFF AND RETIC
Eosinophils Absolute: 0.2 10*3/uL (ref 0.0–0.5)
HCT: 31.4 % — ABNORMAL LOW (ref 34.8–46.6)
LYMPH%: 25 % (ref 14.0–49.7)
MONO#: 0.4 10*3/uL (ref 0.1–0.9)
RBC: 4.6 10*6/uL (ref 3.70–5.45)
RDW: 17.4 % — ABNORMAL HIGH (ref 11.2–14.5)
Retic %: 0.58 % (ref 0.50–1.50)
Retic Ct Abs: 26.68 10*3/uL (ref 18.30–72.70)

## 2010-09-20 LAB — MORPHOLOGY

## 2010-09-20 NOTE — Progress Notes (Signed)
Summary: wants something for yeast infection  Phone Note Call from Patient Call back at Home Phone [phone]   Caller: Patient Call For: Michele [name] Summary of Call: Pt states she has a yeast infection- has burning, itching, discharge.  Is asking that [facility]. Initial call taken by: Lowella Petties CMA, AAMA,  September 14, 2010 11:22 AM  Follow-up for Phone Call        has she tried otc yeast treatment like monistat ? Follow-up by: Michele [name],  September 14, 2010 1:41 PM  Additional Follow-up for Phone Call Additional follow up Details #1::        Symptoms just started last night so pt has not tried OTC meds. Pt also wanted to know why she is having recurrent yeast infections and wonders if somehow could be related to low iron level?Please advise. [name] LPN  September 14, 2010 2:10 PM     Additional Follow-up for Phone Call Additional follow up Details #2::    not the low iron level-- but with stress and change-- the body can have increases in blood sugar that can make you more prone  I hate to overuse the diflucan--am tempted to use terazol cream this time and have her update me  of course f/u if not improved px written on EMR for call in  Follow-up by: Michele [name],  September 14, 2010 2:38 PM  Additional Follow-up for Phone Call Additional follow up Details #3:: Details for Additional Follow-up Action Taken: Patient notified as instructed by telephone. Med [facility] to [facility] [address] as instructed.[name] LPN  September 14, 2010 3:45 PM   New/Updated Medications: TERAZOL 3 0.8 % CREA (TERCONAZOLE) insert 1 applicator intravaginally once daily for 3 days Prescriptions: TERAZOL 3 0.8 % CREA (TERCONAZOLE) insert 1 applicator intravaginally once daily for 3 days  #1 course x 0   Entered by:   [name] LPN   Authorized by:   Michele [name]   Signed by:   [name] LPN on 09/81/1914   Method used:   Electronically to          [facility]       [address]       [place], [place]  [address]       Ph: [phone]       Fax: [phone]   RxID:   [id number]

## 2010-09-21 LAB — IRON AND TIBC: UIBC: 446 ug/dL

## 2010-09-21 LAB — COMPREHENSIVE METABOLIC PANEL
ALT: 15 U/L (ref 0–35)
AST: 14 U/L (ref 0–37)
Albumin: 3.9 g/dL (ref 3.5–5.2)
Alkaline Phosphatase: 78 U/L (ref 39–117)
BUN: 8 mg/dL (ref 6–23)
Potassium: 4.1 mEq/L (ref 3.5–5.3)
Sodium: 137 mEq/L (ref 135–145)

## 2010-09-21 LAB — ERYTHROPOIETIN: Erythropoietin: 112 m[IU]/mL — ABNORMAL HIGH (ref 2.6–34.0)

## 2010-09-21 LAB — FOLATE: Folate: 9.1 ng/mL

## 2010-09-23 ENCOUNTER — Encounter (HOSPITAL_BASED_OUTPATIENT_CLINIC_OR_DEPARTMENT_OTHER): Payer: 59 | Admitting: Oncology

## 2010-09-23 DIAGNOSIS — D509 Iron deficiency anemia, unspecified: Secondary | ICD-10-CM

## 2010-09-30 ENCOUNTER — Encounter (HOSPITAL_BASED_OUTPATIENT_CLINIC_OR_DEPARTMENT_OTHER): Payer: 59 | Admitting: Oncology

## 2010-09-30 DIAGNOSIS — D509 Iron deficiency anemia, unspecified: Secondary | ICD-10-CM

## 2010-10-24 ENCOUNTER — Other Ambulatory Visit: Payer: Self-pay | Admitting: Oncology

## 2010-10-24 ENCOUNTER — Encounter (HOSPITAL_BASED_OUTPATIENT_CLINIC_OR_DEPARTMENT_OTHER): Payer: 59 | Admitting: Oncology

## 2010-10-24 DIAGNOSIS — D649 Anemia, unspecified: Secondary | ICD-10-CM

## 2010-10-24 DIAGNOSIS — D509 Iron deficiency anemia, unspecified: Secondary | ICD-10-CM

## 2010-10-24 LAB — RETICULOCYTES (CHCC)
RBC.: 5.24 MIL/uL — ABNORMAL HIGH (ref 3.87–5.11)
Retic Ct Pct: 0.3 % — ABNORMAL LOW (ref 0.4–3.1)

## 2010-10-24 LAB — CBC & DIFF AND RETIC
BASO%: 0.4 % (ref 0.0–2.0)
EOS%: 1.3 % (ref 0.0–7.0)
MCH: 24.6 pg — ABNORMAL LOW (ref 25.1–34.0)
MCV: 77.3 fL — ABNORMAL LOW (ref 79.5–101.0)
NEUT#: 3.6 10*3/uL (ref 1.5–6.5)
RBC: 5.25 10*6/uL (ref 3.70–5.45)
lymph#: 1.4 10*3/uL (ref 0.9–3.3)

## 2010-10-27 ENCOUNTER — Ambulatory Visit
Admission: RE | Admit: 2010-10-27 | Discharge: 2010-10-27 | Disposition: A | Discharge status: Home / Self Care | Payer: 59 | Source: Ambulatory Visit | Attending: Family Medicine | Admitting: Family Medicine

## 2010-10-27 DIAGNOSIS — Z0000 Encounter for general adult medical examination without abnormal findings: Secondary | ICD-10-CM

## 2010-11-01 ENCOUNTER — Encounter: Payer: Self-pay | Admitting: *Deleted

## 2010-11-15 NOTE — Assessment & Plan Note (Signed)
[facility]                            CARDIOLOGY OFFICE NOTE   Michele [name], Michele [name]                       MRN:          [id number]  DATE:11/20/2007                            DOB:          1966/06/03    Michele [name] is a 45 year old female whom I am asked to evaluate for low  blood pressure and syncope.  The patient has no prior cardiac history.  She typically has some dyspnea on exertion, but she attributes to her  asthma, and this has been long-term.  There is no orthopnea, PND or  pedal edema.  She does not have palpitations or exertional chest pain.  Note, she can walk up 2 flights of stairs without significant symptoms.  She has had problems with lightheadedness and syncope since her early  31s.  Her syncopal episodes have typically occurred while she was in the  shower with her husband by report; one episode occurred when she was  seeing a friend in the hospital, and she had a recent episode less than  1 month ago.  This episode occurred while she was having a mammogram.  She states that she became lightheaded and nauseated as well as  diaphoretic.  There was no chest pain, palpitations, or shortness of  breath.  There was no weakness or loss of sensation in extremities; nor  was there any incontinence seizure activity.  She was out for a few  seconds, and this resolved.  This is similar to her previous episodes as  well.  She does occasionally also feel lightheaded with standing.  She  is also scheduled to have left shoulder surgery, and we are asked to  evaluate prior to that.   MEDICATIONS:  1. Zegerid 40 mg p.o. daily.  2. Singulair.  3. Fluoxetine.  4. Vinate.  5. Vitamin C.  6. Calcium.  7. Omega-3.  8. Folate.   SHE HAS AN ALLERGY TO DARVOCET.   SOCIAL HISTORY:  She does not smoke at present but has remote history of  tobacco use.  She has not used tobacco in 14 years.  She does not  consume alcohol.   FAMILY HISTORY:  Negative  for coronary disease or sudden death.   PAST MEDICAL HISTORY:  1. There is no diabetes mellitus, hypertension or hyperlipidemia.  2. She does have a history of asthma.  3. She has a history of gastroesophageal reflux disease.  4. She has had prior carpal tunnel surgery bilaterally.  5. She has also had surgery for endometriosis.   REVIEW OF SYSTEMS:  She denies any headaches or fevers or chills.  There  is no productive cough or hemoptysis.  There is no dysphagia,  odynophagia, or melena.  She has had some hematochezia, and this is  being evaluated by Dr. Milinda Antis, and a colonoscopy has been scheduled.  There is no orthopnea, PND or pedal edema.  There is no rashes or  seizure activity.  There is no claudication noted.  She does have  occasional anxiety, by her report, and constipation.  The remaining  systems are negative.   PHYSICAL EXAMINATION:  Blood pressure 120/79 and pulse 74.  She weighs  181 pounds.  She is well-developed, well-nourished, in no acute distress.  SKIN:  Warm and dry.  She does not appear to be depressed.  Has no peripheral clubbing.  BACK:  Normal.  HEENT:  Normal with normal eyelids.  NECK:  Supple with a normal upstroke bilaterally, and I cannot  appreciate bruits.  There is no jugular distention, and no thyromegaly  is noted.  CHEST:  Clear to auscultation with normal expansion.  CARDIOVASCULAR:  Regular rate and rhythm, normal S1-S2.  There are no  murmurs, rubs, or gallops noted.  There is no change with Valsalva.  ABDOMINAL:  Nontender, nondistended, positive bowel sounds.  No  hepatosplenomegaly, no mass appreciated.  There is no abdominal bruit.  She has 2+ femoral pulses bilaterally and no bruits.  EXTREMITIES:  No edema that I could palpate and no cords.  She has 2+  posterior tibial pulses bilaterally.  NEUROLOGIC:  Grossly intact.   I do have an electrocardiogram from Dr. Royden Purl office that showed a  sinus rhythm with no ST changes.  The QT  interval was normal.   DIAGNOSES:  1. Syncope - this appears to be vasovagal in description.  She always      feels somewhat queasy prior to these episodes as well as      diaphoretic.  There is no associated chest pain, shortness of      breath, or palpitations.  I have instructed her to liberalize her      fluid intake as well as her salt intake.  Hopefully, this will help      with her symptoms.  If not, we can consider medications in the      future.  I will see her back in 6 months to review the above.      Note, we will schedule her to have an echocardiogram to quantify      her left ventricular function.  2. Low blood pressure - as per #1, we are liberalizing fluid and salt      intake.  3. Preoperative evaluation prior to shoulder surgery - she is having      arthroscopic surgery.  Note, her electrocardiogram is normal; she      is not having symptoms of chest pain, and her only risk factor      essentially is recently diagnosed hyperlipidemia.  I think she can      proceed safely without further ischemia evaluation.  4. Hyperlipidemia - managed per Dr. Milinda Antis.  5. Asthma - managed per Dr. Milinda Antis.     Madolyn Frieze Jens Som, MD, [facility]  Electronically Signed    BSC/MedQ  DD: 11/20/2007  DT: 11/20/2007  Job #: [id number]   cc:   Marne A. Milinda Antis, MD

## 2010-11-18 NOTE — H&P (Signed)
NAME:  Michele [name], Michele [name]                          ACCOUNT NO.:  [id number]   MEDICAL RECORD NO.:  [id number]                   PATIENT TYPE:  AMB   LOCATION:  SDC                                  FACILITY:  [facility]   PHYSICIAN:  Richardean Sale, M.D.                DATE OF BIRTH:  Aug 03, 1965   DATE OF ADMISSION:  DATE OF DISCHARGE:                                HISTORY & PHYSICAL   PREOPERATIVE DIAGNOSIS:  Irregular menses and endometrial polyps.   HISTORY OF PRESENT ILLNESS:  This is a 45 year old white female with history  of regular cycles who was found to have endometrial polyps on  sonohysterogram who presents for hysteroscopy with resection of polyps and  D&C.   PAST MEDICAL HISTORY:  Postoperative infection after undergoing cryotherapy  for abnormal Pap smear.  No history of cardiac disease.  She does have a  history of asthma which is well controlled.  Positive history of anxiety and  depression as well as gastroesophageal reflux.   SURGICAL HISTORY:  Cryotherapy of the cervix for abnormal Pap smear and  bilateral carpal tunnel repair.   GYNECOLOGICAL HISTORY:  Positive abnormal Pap status post cryo.  Denies any  STDs.   OBSTETRICAL HISTORY:  Gravida 2 para 1-0-1-1.   SOCIAL HISTORY:  She is married.  Denies any tobacco, alcohol, or drug  abuse.   FAMILY HISTORY:  Noncontributory.   MEDICATIONS:  1. Nexium 40 mg p.o. daily.  2. Fluoxetine 40 mg p.o. daily.  3. Vistaril 50 mg p.o. daily.  4. Albuterol inhaler p.r.n.  5. Singulair 10 mg p.o. q.h.s.  6. Folic acid 400 mcg daily.  7. Multivitamin one p.o. daily.  8. Aspirin 81 mg p.o. daily.   ALLERGIES:  OXYCODONE and BETADINE.   PHYSICAL EXAMINATION:  VITAL SIGNS:  She is afebrile, vital signs are  stable, weight is 221.  GENERAL:  She is a well-developed, well-nourished white female in no  apparent distress.  LUNGS:  Clear to auscultation bilaterally.  HEART:  Regular rate and rhythm.  No murmurs, rubs, or  gallops.  NECK:  Neck and thyroid are within normal limits.  ABDOMEN:  Soft, nontender, nondistended.  No palpable masses.  Liver and  spleen are normal.  PELVIC:  Deferred to the OR.  EXTREMITIES:  No cyanosis, clubbing, or edema, and nontender.   Recent sonohysterogram report:  Two hyperechoic masses on the anterior wall  as well as a proliferative focus of endometrium on the posterior wall.  No  adnexal masses identified.   ASSESSMENT:  A 45 year old white female with regular bleeding and  endometrial polyps.   PLAN:  The patient is scheduled to undergo a hysteroscopy with resection of  polyps and D&C.  Risks of the procedure including but not limited to  hemorrhage requiring transfusion; infection requiring prolonged  hospitalization or intrauterine scarring; uterine perforation requiring  additional surgery with subsequent injury to the  bowel, bladder, or other  intraabdominal organs; anesthetic-related complications; DVT; and even  death.  The patient voided understanding of all these risks and accepts  these risks.  Consent obtained.  All questions are answered.  Will plan to  proceed with hysteroscopy D&C on October 05, 2003.                                               Richardean Sale, M.D.    JW/MEDQ  D:  10/02/2003  T:  10/02/2003  Job:  [id number]

## 2010-11-18 NOTE — Op Note (Signed)
NAMECHARNELLE, Michele [name]                ACCOUNT NO.:  [id number]   MEDICAL RECORD NO.:  [id number]          PATIENT TYPE:  AMB   LOCATION:  SDC                           FACILITY:  [facility]   PHYSICIAN:  Richardean Sale, M.D.   DATE OF BIRTH:  01/05/1966   DATE OF PROCEDURE:  05/03/2005  DATE OF DISCHARGE:                                 OPERATIVE REPORT   PREOPERATIVE DIAGNOSES:  1.  Dysmenorrhea.  2.  Dyspareunia.  3.  Pelvic pain.  4.  Secondary infertility.   POSTOPERATIVE DIAGNOSES:  1.  Dysmenorrhea.  2.  Dyspareunia.  3.  Pelvic pain.  4.  Secondary infertility.  5.  Endometriosis.  6.  Left ovarian cyst.   PROCEDURE:  Laparoscopy with fulguration of endometriosis and left ovarian  cystectomy.   SURGEON:  Richardean Sale, M.D.   ASSISTANT:  Genia Del, M.D.   ANESTHESIA:  General.   COMPLICATIONS:  None.   ESTIMATED BLOOD LOSS:  Minimal.   FINDINGS:  A scant amount of endometriosis with one implant in the anterior  cul-de-sac and another implant on the posterior aspect of the left ovary.  Posterior cul-de-sac, uterosacral ligaments, right ovarian fossa, left  ovarian fossa, right ovary all normal.  Normal-appearing fallopian tube on  the right, left adhesion of the fimbriae to the ovary.  Right fallopian tube  patent on chromopertubation but left fallopian tube not.  Left ovarian cyst  frozen section benign.   INDICATIONS:  This is a 45 year old gravida 2, para 1-0-0-1, white female  who has had progressively worsening left lower quadrant pain, dysmenorrhea,  dyspareunia, as well as secondary infertility.  The patient had undergone  evaluation with an HSG, which showed a patent right fallopian tube but an  obstructed left fallopian tube.  She presents today for laparoscopy with  possible fulguration of endometriosis and evaluation of fallopian tubes.  Prior to the procedure the risks, benefits and alternatives of the procedure  were reviewed with the patient in  detail.  We discussed the risks of  hemorrhage, infection, injury to the uterus, the bowel or bladder or other  organs which could require additional surgery, and the possibility that  intraoperative findings may be completely negative and may not identify or  correct the source of pain.  We also discussed general anesthetic-related  complications including DVT.  The patient voiced understanding of all these  risks and agreed to proceed and informed consent was obtained.   PROCEDURE:  The patient was taken to the operating room, where she was then  prepped and draped in the usual sterile fashion and placed in the dorsal  lithotomy position.  A Foley catheter was then placed in the bladder and a  speculum was placed in the vagina.  The cervix was visualized and a single-  tooth tenaculum was used to grasp the anterior lip of the cervix.  The Acorn  cannula was then introduced and the speculum was removed.  Attention was  then turned the patient's abdomen, where a 10 mm skin incision was made in  the infraumbilical area and 5 mL of  0.5% plain Marcaine were injected prior  to doing this.  This was carried down sharply to the fascia.  The fascia was  then grasped with Kocher clamps, elevated and incised.  The peritoneum was  then identified, grasped between two [facility], and entered sharply.  A finger  was then placed in the incision.  There was no evidence of any intra-  abdominal adhesions.  The fascial incision was then fixed with a pursestring  Vicryl suture and the Hasson trocar and sleeve were then introduced.  The  camera was used to confirm intra-abdominal placement and CO2 gas was allowed  to insufflate.  The inspection of the pelvis revealed a retroverted uterus  with an anterior uterine fibroid.  The left fallopian tube and ovary could  not be visualized.  Therefore, a second incision was made.  This was in the  patient's left lower quadrant first by injecting 3 mL of 0.25% plain   Marcaine and then a 5 mm port was then introduced.  By using an atraumatic  blunt probe, the pelvis was inspected with the findings noted above.  On the  left ovary there was a 2 cm solid-appearing mass originating from the end of  the ovary.  This had almost an encapsulated appearance.  It appeared almost  to be a fibroid or possibly a fibroadenoma.  On inspection of the rest of  the pelvis there was endometriosis on the posterior aspect of the ovary on  the left and a small amount of endometriosis, a single implant on the  anterior cul-de-sac.  The posterior cul-de-sac, uterosacrals, right  fallopian tube and ovary were entirely normal.  On the left fallopian tube,  the fimbriae were adherent to the edge of the ovary.  This adhesion was  removed with blunt dissection.  At this point a third incision was then made  in the right lower quadrant and this was introduced in a similar fashion to  the left side.  This was a 5 mm port.  Using the pinpoint gyrus cautery, the  base of the ovarian cyst was then incised.  The ovarian cyst was removed and  upon entry into the cyst, there was a yellowish appearance to this, and it  was quite soft.  The remainder of the cyst was then removed and placed in  the anterior cul-de-sac.  There was some bleeding coming from the incision  site on the ovary.  This was cauterized using the bipolar cautery.  Once hemostasis was assured,  the 10 mm camera was then switched out for the 5 mm camera and an Endobag  was then introduced into the 10 mm port.  The ovarian cyst was then placed  into the Endobag and was removed through the umbilical incision.  This was  sent to pathology given its unusual appearance and the vasculature.  It was  sent for frozen section and came back simply a benign corpus luteum.  The  Hasson trocar and sleeve was then reintroduced.  We switched back to the 10 mm camera.  The left ovary was hemostatic.  The anterior cul-de-sac was   inspected.  The endometrial implant was then cauterized with the bipolar.  On the left ovary on the posterior aspect, the endometrial implant there was  also cauterized with bipolar cautery.  At this point the left fallopian  tube, as it had previously appeared abnormal on HSG and did not fill, an  attempt was made to evaluate the tube for patency by placing  50 mL of  methylene blue through the cervix.  The left fallopian tube did not show any  evidence of spill.  The right tube appeared patent.  At this point all  surgical sites were inspected and were hemostatic.  The appendix was  identified and appeared normal and the liver was visualized and normal.  At  this point the procedure was terminated.  All irrigation and suction was  removed.  Interceed was placed over the left ovary to help prevent any  postoperative adhesions.  The 5 mm ports were then removed under direct  visualization.  There was no evidence of any bleeding as CO2 gas escaped and  the Hasson trocar and sleeve and camera were then removed.  The fascial  incision was closed by tying the previously-placed pursestring Vicryl suture  and the skin incisions were then closed with 4-0 Vicryl in subcuticular  fashion and were hemostatic.  Attention was then turned to the patient's  vagina, where the acorn cannula and single-tooth tenaculum were removed.  The cervix was visualized and there was no evidence of bleeding from the  tenaculum site.  The patient  tolerated the procedure very well.  All sponge, lap, needle and instrument  counts were correct x2.  She was taken out of the dorsal lithotomy position  and awakened from anesthesia and was taken to the recovery room awake and in  stable condition.  There were no complications.      Richardean Sale, M.D.  Electronically Signed     JW/MEDQ  D:  05/03/2005  T:  05/03/2005  Job:  [id number]

## 2010-11-18 NOTE — Op Note (Signed)
   NAME:  Michele [name], Michele [name]                          ACCOUNT NO.:  [id number]   MEDICAL RECORD NO.:  [id number]                   PATIENT TYPE:  AMB   LOCATION:  DSC                                  FACILITY:  [facility]   PHYSICIAN:  Rodney A. Chaney Malling, M.D.           DATE OF BIRTH:  Jun 24, 1966   DATE OF PROCEDURE:  04/23/2003  DATE OF DISCHARGE:                                 OPERATIVE REPORT   PREOPERATIVE DIAGNOSIS:  Left carpal tunnel syndrome.   POSTOPERATIVE DIAGNOSIS:  Left carpal tunnel syndrome.   OPERATION:  Release transverse carpal ligament left wrist and decompression  median nerve left wrist.   SURGEON:  Rodney A. Chaney Malling, M.D.   ANESTHESIA:  MAC.   PROCEDURE:  The patient was placed on the operating table in the supine  position. The pneumatic tourniquet was applied to the left upper  arm. The  left upper extremity was prepped with Hibiclens followed  by alcohol. The  arm was then draped out in the usual manner. The area of incision was  infiltrated with 2% local Xylocaine. The hand was then wrapped out with an  Esmarch and the tourniquet was elevated.   A lazy-S incision was made on the volar surface of the wrist starting  proximally to the volar wrist crease and carried into the mid palmar space.  The skin edges were retracted. Loupe magnification was used throughout. The  fascia of the forearm was opened.   The median nerve was identified and isolated. The curved Mayo scissors were  placed underneath the transverse carpal ligament and above the median nerve  to protect the median nerve. The transverse carpal ligament was then  released off the ulnar border of the carpal canal out in the midpalmar space  under direct vision. Complete decompression of the nerve was achieved very  nicely. No space occupying lesions were seen. There was loss of vascular  markings underneath the transverse ligament.   The wound was then bathed in Marcaine. The skin edges were closed  with 4-0  nylon suture. A large bulky pressure dressing was applied and the patient  was returned to the recovery room in excellent condition.   Technically this procedure went extremely well. For follow up care she is to  return to my office next  week and Mepergan Fortis for pain.                                               Rodney A. Chaney Malling, M.D.    RAM/MEDQ  D:  04/23/2003  T:  04/23/2003  Job:  [id number]

## 2010-11-18 NOTE — Op Note (Signed)
Michele [name], Michele [name]                          ACCOUNT NO.:  [id number]   MEDICAL RECORD NO.:  [id number]                   PATIENT TYPE:  AMB   LOCATION:  SDC                                  FACILITY:  [facility]   PHYSICIAN:  Richardean Sale, M.D.                DATE OF BIRTH:  [birth year]   DATE OF PROCEDURE:  10/05/2003  DATE OF DISCHARGE:                                 OPERATIVE REPORT   PREOPERATIVE DIAGNOSES:  1. Irregular menses.  2. Endometrial polyps.   POSTOPERATIVE DIAGNOSES:  1. Irregular menses.  2. Endometrial polyps.  3. Stenotic endocervical canal.   PROCEDURES:  1. Hysteroscopy with resection of endometrial and endocervical polyp.  2. Dilation and curettage.   SURGEON:  Richardean Sale, M.D.   ANESTHESIA:  General endotracheal.   COMPLICATIONS:  None.   ESTIMATED BLOOD LOSS:  50 mL.   FLUID DEFICIT:  Total of 70 mL.   FINDINGS:  Multiple endometrial and endocervical polyps located in the lower  uterine segment and cervix.  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   INDICATIONS:  This is a 45 year old white female with a history of irregular  cycles, who was found to have endometrial polyps on sonohysterogram, who  presented for hysteroscopy with resection of polyps and D&C.  Prior to the  procedure the risks of the procedure were reviewed with the patient and  informed consent was obtained.  Specifically, we discussed the risks of  uterine perforation requiring additional abdominal surgery, possible injury  to the bowel or bladder if perforation should occur, hemorrhage requiring  transfusion, infection requiring prolonged hospitalization or causing  intrauterine scarring, deep venous thrombosis, cardiac and respiratory  complications, and anesthetic-related complications.  The patient voiced an  understanding of all these risks and agrees to proceed.   PROCEDURE:  After informed consent was obtained, the  patient was taken to  the operating room, where she was prepped and draped in the usual sterile  fashion with a Hibiclens prep, placed in the dorsal lithotomy position.  A  red rubber catheter was used to drain the bladder.  Bimanual exam was then  performed, which confirmed the presence of a small, mobile, anteverted  uterus with no palpable masses.  A speculum was then placed into the vagina.  The cervix was then injected at the 12 o'clock position with 2 mL of 1%  Nesacaine.  The anterior lip of the cervix was then grasped with a single-  tooth tenaculum and a paracervical block was administered using a total of  10 mL of 1% Nesacaine.  At this point the cervix was then very gently  dilated with the Hegar dilators.  There was quite a bit of resistance  initially secondary to scarring of the endocervix but with great caution and  gentle pressure, the cervix was adequately dilated so the hysteroscope could  be introduced.  Upon introduction of the hysteroscope there were multiple  polyps present in the endocervix that were making visualization of the  uterine cavity very difficult.  The hysteroscope was then removed as I was  unable to adequately visualize the fundus secondary to obstruction by an  endocervical polyp; therefore, the polyp forceps were then gently introduced  and a few polyp fragments were removed.  This was followed by an  endocervical curettage and additional polyps were removed.  These specimens  were sent to pathology.  At this point the hysteroscope was reintroduced and  I was able to adequately maneuver into the uterine fundus without  difficulty.  The fundus was then inspected and the tubal ostia were  visualized.  There was some prominent and proliferative-appearing  endometrium in the left cornu.  The remainder of the cavity appeared normal.  At this point the resectoscope was introduced and the proliferative area of  endometrium was very carefully resected and  sent to pathology.  The fundus  was then inspected.  Again there were no active areas of bleeding.  The  hysteroscope was then removed.  A few additional polyp fragments were  grasped from the lower uterine segment and also sent to pathology.  After  the hysteroscope was removed, this was followed by a careful sharp  curettage, and the endometrial curettings were also sent to pathology.  At  this point the single-tooth tenaculum was removed from the cervix, the  cervix was visualized, and there was no active bleeding noted from the  tenaculum site.  The speculum was removed and the procedure was terminated.  The patient was then taken out of the dorsal lithotomy position.  She was  awakened from general anesthesia.  All sponge, lap, needle, and instrument  counts were correct x2.  The patient tolerated the procedure very well.                                               Richardean Sale, M.D.    JW/MEDQ  D:  10/05/2003  T:  10/05/2003  Job:  [id number]

## 2010-12-12 ENCOUNTER — Other Ambulatory Visit: Payer: Self-pay | Admitting: Family Medicine

## 2010-12-12 NOTE — Telephone Encounter (Signed)
Rx called to [facility]. 

## 2010-12-12 NOTE — Telephone Encounter (Signed)
Px written for call in   

## 2011-01-18 ENCOUNTER — Encounter: Payer: Self-pay | Admitting: Family Medicine

## 2011-01-19 ENCOUNTER — Encounter: Payer: Self-pay | Admitting: Family Medicine

## 2011-01-19 ENCOUNTER — Ambulatory Visit (INDEPENDENT_AMBULATORY_CARE_PROVIDER_SITE_OTHER): Payer: Self-pay | Admitting: Family Medicine

## 2011-01-19 VITALS — BP 110/70 | HR 72 | Temp 98.2°F | Wt 190.2 lb

## 2011-01-19 DIAGNOSIS — W57XXXA Bitten or stung by nonvenomous insect and other nonvenomous arthropods, initial encounter: Secondary | ICD-10-CM | POA: Insufficient documentation

## 2011-01-19 DIAGNOSIS — S60569A Insect bite (nonvenomous) of unspecified hand, initial encounter: Secondary | ICD-10-CM | POA: Insufficient documentation

## 2011-01-19 MED ORDER — TRIAMCINOLONE ACETONIDE 0.1 % EX CREA: TOPICAL_CREAM | Freq: Two times a day (BID) | CUTANEOUS | Status: AC | PRN

## 2011-01-19 MED ORDER — DOXYCYCLINE HYCLATE 100 MG PO CAPS: 100.0000 mg | ORAL_CAPSULE | Freq: Two times a day (BID) | ORAL | Status: AC

## 2011-01-19 NOTE — Progress Notes (Signed)
  Subjective:    Patient ID: Michele [name], female    DOB: 04/15/1966, 45 y.o.   MRN: [id number]  HPI CC: left wrist   DOI: 01/04/2011, got bit/stung by something outside.  Day of bite started feeling nauseated.  Wrist started swelling, then got better.  Now over last 5 days has started getting larger and swollen again.  Very itchy, rash has popped up, taking benadryl for itch.  No fevers/chills, rash anywhere else, myalgia, arthralgia, abd pain, n/v, no streaking.  Itch more than pain  Review of Systems Per HPI    Objective:   Physical Exam  Nursing note and vitals reviewed. Constitutional: She appears well-developed and well-nourished. No distress.  HENT:  Head: Normocephalic and atraumatic.  Skin: Skin is warm.          Left anterior wrist 3 cm proximal to wrist crease with evident swelling surrounding small abrasion/bite, swelling 3.5cm diameter.  Lateral side of swelling with erthematous maculopapular rash, pruritic. No fluctuance, induration  2+ rad pulses      Assessment & Plan:

## 2011-01-19 NOTE — Patient Instructions (Signed)
Looks like an allergic reaction to bug bite. Will treat with antibiotic to cover any possible infection going on. For reaction, may continue benadryl and start steroid cream (sent to pharmacy).

## 2011-01-19 NOTE — Assessment & Plan Note (Signed)
Just proximal to L hand. Not evidently infected however does have significant swelling and new rash. Treat with abx to cover any infection, treat with steroid cream and continue benadryl. Update [place] if any worsening or not improving as expected.

## 2011-02-09 ENCOUNTER — Other Ambulatory Visit: Payer: Self-pay | Admitting: *Deleted

## 2011-02-09 MED ORDER — MONTELUKAST SODIUM 10 MG PO TABS: 10.0000 mg | ORAL_TABLET | Freq: Every day | ORAL | Status: DC

## 2011-02-09 NOTE — Telephone Encounter (Signed)
[facility] on [address] electronically request refill on Singulair 10mg  #30 x 3.

## 2011-02-16 ENCOUNTER — Other Ambulatory Visit: Payer: Self-pay | Admitting: Oncology

## 2011-02-16 ENCOUNTER — Encounter (HOSPITAL_BASED_OUTPATIENT_CLINIC_OR_DEPARTMENT_OTHER): Payer: Commercial Managed Care - PPO | Admitting: Oncology

## 2011-02-16 DIAGNOSIS — D509 Iron deficiency anemia, unspecified: Secondary | ICD-10-CM

## 2011-02-16 DIAGNOSIS — D649 Anemia, unspecified: Secondary | ICD-10-CM

## 2011-02-16 DIAGNOSIS — K449 Diaphragmatic hernia without obstruction or gangrene: Secondary | ICD-10-CM

## 2011-02-16 DIAGNOSIS — D259 Leiomyoma of uterus, unspecified: Secondary | ICD-10-CM

## 2011-02-16 LAB — IRON AND TIBC
%SAT: 20 % (ref 20–55)
TIBC: 348 ug/dL (ref 250–470)

## 2011-02-16 LAB — CBC WITH DIFFERENTIAL/PLATELET
Basophils Absolute: 0 10*3/uL (ref 0.0–0.1)
EOS%: 3.6 % (ref 0.0–7.0)
HCT: 40.4 % (ref 34.8–46.6)
HGB: 14 g/dL (ref 11.6–15.9)
MCH: 31.2 pg (ref 25.1–34.0)
NEUT%: 64.4 % (ref 38.4–76.8)
Platelets: 296 10*3/uL (ref 145–400)
lymph#: 1.9 10*3/uL (ref 0.9–3.3)

## 2011-02-23 ENCOUNTER — Encounter (HOSPITAL_BASED_OUTPATIENT_CLINIC_OR_DEPARTMENT_OTHER): Payer: Commercial Managed Care - PPO | Admitting: Oncology

## 2011-02-23 DIAGNOSIS — D509 Iron deficiency anemia, unspecified: Secondary | ICD-10-CM

## 2011-03-03 ENCOUNTER — Encounter (HOSPITAL_BASED_OUTPATIENT_CLINIC_OR_DEPARTMENT_OTHER): Payer: Commercial Managed Care - PPO | Admitting: Oncology

## 2011-03-03 DIAGNOSIS — D509 Iron deficiency anemia, unspecified: Secondary | ICD-10-CM

## 2011-03-09 ENCOUNTER — Ambulatory Visit (INDEPENDENT_AMBULATORY_CARE_PROVIDER_SITE_OTHER): Payer: Commercial Managed Care - PPO | Admitting: Family Medicine

## 2011-03-09 ENCOUNTER — Encounter: Payer: Self-pay | Admitting: Family Medicine

## 2011-03-09 DIAGNOSIS — J069 Acute upper respiratory infection, unspecified: Secondary | ICD-10-CM

## 2011-03-09 NOTE — Assessment & Plan Note (Signed)
Likely viral given the soft palate changes.  Nontoxic and lungs ctab.  Use SABA prn and supportive measures o/w. Okay for outpatient f/u.  Out of work until sx resolve, potentially contagious.  She agrees.

## 2011-03-09 NOTE — Patient Instructions (Signed)
Drink plenty of fluids, take tylenol as needed, and gargle with warm salt water for your throat.  This should gradually improve.  Take care.  Let us know if you have other concerns.    

## 2011-03-09 NOTE — Progress Notes (Signed)
duration of symptoms: a few days, initially with sniffles but worse now with productive cough Rhinorrhea: yes Congestion: yes ear pain: L ear pain sore throat: yes cough:yes Myalgias: minimal  other concerns: no fever, but felt hot, more at night. Sick contacts: no Working at [employer].  Nonsmoker.  Minimal wheeze.    ROS: See HPI.  Otherwise negative.    Meds, vitals, and allergies reviewed.   GEN: nad, alert and oriented HEENT: mucous membranes moist, TM w/o erythema, nasal epithelium injected, OP with cobblestoning and small punctate ulcerations on the soft palate NECK: supple w/o LA CV: rrr. PULM: ctab, no inc wob ABD: soft, +bs EXT: no edema

## 2011-06-12 ENCOUNTER — Other Ambulatory Visit: Payer: Self-pay | Admitting: Family Medicine

## 2011-06-17 ENCOUNTER — Telehealth: Payer: Self-pay | Admitting: *Deleted

## 2011-06-17 NOTE — Telephone Encounter (Signed)
left voice message to inform the patient of the new date and time 09-2011

## 2011-06-20 ENCOUNTER — Telehealth: Payer: Self-pay | Admitting: *Deleted

## 2011-06-20 NOTE — Telephone Encounter (Signed)
left patient message to inform the patient of the new date and time on 09-27-2011 at 3:30pm

## 2011-08-05 ENCOUNTER — Other Ambulatory Visit: Payer: Self-pay | Admitting: Family Medicine

## 2011-08-07 NOTE — Telephone Encounter (Signed)
Will refill electronically  

## 2011-08-07 NOTE — Telephone Encounter (Signed)
Received refill request electronically from pharmacy. Is it okay to refill medication? 

## 2011-08-29 ENCOUNTER — Telehealth: Payer: Self-pay | Admitting: *Deleted

## 2011-08-29 NOTE — Telephone Encounter (Signed)
patient confirmed over the phone the new date and time 

## 2011-09-20 ENCOUNTER — Other Ambulatory Visit (HOSPITAL_BASED_OUTPATIENT_CLINIC_OR_DEPARTMENT_OTHER): Payer: Commercial Managed Care - PPO | Admitting: Lab

## 2011-09-20 DIAGNOSIS — D509 Iron deficiency anemia, unspecified: Secondary | ICD-10-CM

## 2011-09-20 LAB — IRON AND TIBC
%SAT: 30 % (ref 20–55)
Iron: 100 ug/dL (ref 42–145)
UIBC: 231 ug/dL (ref 125–400)

## 2011-09-20 LAB — CBC WITH DIFFERENTIAL/PLATELET
Eosinophils Absolute: 0.2 10*3/uL (ref 0.0–0.5)
MONO#: 0.5 10*3/uL (ref 0.1–0.9)
NEUT#: 5.3 10*3/uL (ref 1.5–6.5)
RBC: 4.67 10*6/uL (ref 3.70–5.45)
RDW: 12.5 % (ref 11.2–14.5)
WBC: 7.6 10*3/uL (ref 3.9–10.3)

## 2011-09-20 LAB — FERRITIN: Ferritin: 215 ng/mL (ref 10–291)

## 2011-09-21 ENCOUNTER — Telehealth: Payer: Self-pay | Admitting: *Deleted

## 2011-09-21 NOTE — Telephone Encounter (Signed)
Per MD, Called notified pt LMOVM " Iron levels look good" Pt to call back to confirm msg received & if she has further concerns

## 2011-09-21 NOTE — Telephone Encounter (Signed)
Message copied by Cooper Render on Thu Sep 21, 2011  3:49 PM ------      Message from: Victorino December      Created: Wed Sep 20, 2011  6:49 PM       Call patient: iron levels good.

## 2011-09-27 ENCOUNTER — Encounter: Payer: Self-pay | Admitting: Oncology

## 2011-09-27 ENCOUNTER — Ambulatory Visit (HOSPITAL_BASED_OUTPATIENT_CLINIC_OR_DEPARTMENT_OTHER): Payer: Commercial Managed Care - PPO | Admitting: Oncology

## 2011-09-27 VITALS — BP 115/77 | HR 76 | Temp 98.7°F | Ht 68.0 in | Wt 202.8 lb

## 2011-09-27 DIAGNOSIS — D649 Anemia, unspecified: Secondary | ICD-10-CM

## 2011-09-27 NOTE — Progress Notes (Signed)
OFFICE PROGRESS NOTE  CC  [name], MD, MD [address] [place] [address] [place] [address] Dr. Vida Rigger  DIAGNOSIS: 46 year old Michele [name] with iron deficiency anemia  PRIOR THERAPY:  #1 status post 2 doses of therapy heme in April 2012.  #2 she is now on oral iron twice a day tolerating it well.  CURRENT THERAPY:Patient will reduce her oral iron to once a day  INTERVAL HISTORY: Michele Michele [name] 46 y.o. Michele [name] returns for Followup visit today. Clinically she looks great she still however has some fatigue she is taking oral iron tolerating it well. Her ferritin today is 215 with a hemoglobin of 14.6 hematocrit of Michele.9. This is a significant response to her treatments. She is denying any nausea vomiting fevers chills night sweats headaches shortness of breath chest pains or palpitations. She has no myalgias or arthralgias at this time. Patient does tell me that she is undergoing perimenopausal symptoms she does have considerable amount of bleeding. This is being managed by her OB/GYN and she is being tried on what sounds like either oral contraceptives or hormone replacement therapy I am uncertain as to which one. She and I did discuss her family history. She does tell me that she has several family members who have a history of breast cancer. She has been getting mammograms on a regular basis. Remainder of the 10 point review of systems is negative.  MEDICAL HISTORY: Past Medical History  Diagnosis Date  . Anxiety   . Asthma   . HLD (hyperlipidemia)   . Tachycardia     s/p negative cardiac work up with echo  . IBS (irritable bowel syndrome)   . Endometriosis   . Rotator cuff tear, left 2009  . Syncope   . Iron deficiency anemia 1/12  . Hiatal hernia   . Gastric polyp   . Abnormal Pap smear of cervix     cryotx  . Uterine polyp   . Hemorrhoids     ALLERGIES:  is allergic to nsaids; povidone-iodine; and propoxacet-n.  MEDICATIONS:  Current Outpatient  Prescriptions  Medication Sig Dispense Refill  . albuterol (PROAIR HFA) 108 (90 BASE) MCG/ACT inhaler Inhale 2 puffs into the lungs every 4 (four) hours as needed.        Marland Kitchen FLUoxetine (PROZAC) 40 MG capsule TAKE ONE CAPSULE BY MOUTH EVERY EVENING .  30 capsule  5  . hydrocortisone (ANUSOL-HC) 2.5 % rectal cream Place 1 application rectally 2 (two) times daily.        . iron polysaccharides (NIFEREX) 150 MG capsule Take 150 mg by mouth 2 (two) times daily.       . montelukast (SINGULAIR) 10 MG tablet TAKE  ONE TABLET BY MOUTH NIGHTLY AT BEDTIME  30 tablet  2  . NEXIUM 40 MG capsule TAKE  ONE CAPSULE BY MOUTH EVERY MORNING  30 each  11  . fluticasone (FLONASE) 50 MCG/ACT nasal spray Place 2 sprays into the nose daily.        Marland Kitchen terconazole (TERAZOL 3) 0.8 % vaginal cream Place 1 applicator vaginally at bedtime.        . triamcinolone (KENALOG) 0.1 % cream Apply topically 2 (two) times daily as needed. Apply to AA.  30 g  0    SURGICAL HISTORY:  Past Surgical History  Procedure Date  . Hysteroscopy   . Esophagogastroduodenoscopy 1999  . Colonoscopy 1999    small polyps, hemms  . Carpal tunnel release   . Laparoscopy  2006    endometriosis  . Esophagogastroduodenoscopy 2/12    gastric polyp and HH    REVIEW OF SYSTEMS:  Pertinent items are noted in HPI.   PHYSICAL EXAMINATION: General appearance: alert, cooperative and appears stated age Resp: clear to auscultation bilaterally Cardio: regular rate and rhythm, S1, S2 normal, no murmur, click, rub or gallop GI: soft, non-tender; bowel sounds normal; no masses,  no organomegaly Extremities: extremities normal, atraumatic, no cyanosis or edema Neurologic: Alert and oriented X 3, normal strength and tone. Normal symmetric reflexes. Normal coordination and gait  ECOG PERFORMANCE STATUS: 0 - Asymptomatic  Blood pressure 115/77, pulse 76, temperature 98.7 F (37.1 C), temperature source Oral, height 5\' 8"  (1.727 m), weight 202 lb 12.8 oz  (91.989 kg).  LABORATORY DATA: Lab Results  Component Value Date   WBC 7.6 09/20/2011   HGB 14.6 09/20/2011   HCT Michele.9 09/20/2011   MCV 91.9 09/20/2011   PLT 267 09/20/2011      Chemistry      Component Value Date/Time   NA 137 09/20/2010 1404   NA 137 09/20/2010 1404   NA 137 09/20/2010 1404   NA 137 09/20/2010 1404   NA 137 09/20/2010 1404   K 4.1 09/20/2010 1404   K 4.1 09/20/2010 1404   K 4.1 09/20/2010 1404   K 4.1 09/20/2010 1404   K 4.1 09/20/2010 1404   CL 105 09/20/2010 1404   CL 105 09/20/2010 1404   CL 105 09/20/2010 1404   CL 105 09/20/2010 1404   CL 105 09/20/2010 1404   CO2 23 09/20/2010 1404   CO2 23 09/20/2010 1404   CO2 23 09/20/2010 1404   CO2 23 09/20/2010 1404   CO2 23 09/20/2010 1404   BUN 8 09/20/2010 1404   BUN 8 09/20/2010 1404   BUN 8 09/20/2010 1404   BUN 8 09/20/2010 1404   BUN 8 09/20/2010 1404   CREATININE 0.74 09/20/2010 1404   CREATININE 0.74 09/20/2010 1404   CREATININE 0.74 09/20/2010 1404   CREATININE 0.74 09/20/2010 1404   CREATININE 0.74 09/20/2010 1404      Component Value Date/Time   CALCIUM 9.1 09/20/2010 1404   CALCIUM 9.1 09/20/2010 1404   CALCIUM 9.1 09/20/2010 1404   CALCIUM 9.1 09/20/2010 1404   CALCIUM 9.1 09/20/2010 1404   ALKPHOS 78 09/20/2010 1404   ALKPHOS 78 09/20/2010 1404   ALKPHOS 78 09/20/2010 1404   ALKPHOS 78 09/20/2010 1404   ALKPHOS 78 09/20/2010 1404   AST 14 09/20/2010 1404   AST 14 09/20/2010 1404   AST 14 09/20/2010 1404   AST 14 09/20/2010 1404   AST 14 09/20/2010 1404   ALT 15 09/20/2010 1404   ALT 15 09/20/2010 1404   ALT 15 09/20/2010 1404   ALT 15 09/20/2010 1404   ALT 15 09/20/2010 1404   BILITOT 0.3 09/20/2010 1404   BILITOT 0.3 09/20/2010 1404   BILITOT 0.3 09/20/2010 1404   BILITOT 0.3 09/20/2010 1404   BILITOT 0.3 09/20/2010 1404       RADIOGRAPHIC STUDIES:  No results found.  ASSESSMENT: 46 year old Michele [name] with  #1 iron deficiency anemia patient's total iron looks great today her H&H is also terrific. At this time she does  not need parenteral iron.  #2 perimenopausal symptoms   PLAN:   #1 patient will take oral iron on a daily basis now instead of twice a day. She will be seen back in 6 months time in one week prior to her visit  we will do iron studies on her.  #2 patient and I discussed her family history today she knows to continue doing self breast examinations and continue getting her mammograms. She will discuss her family history with her gynecologist.   All questions were answered. The patient knows to call the clinic with any problems, questions or concerns. We can certainly see the patient much sooner if necessary.  I spent 25 minutes counseling the patient face to face. The total time spent in the appointment was 30 minutes.    Drue Second, MD Medical/Oncology [facility] [phone] (beeper) [phone] (Office)  09/27/2011, 4:21 PM

## 2011-09-27 NOTE — Patient Instructions (Signed)
1. Continue doing the oral iron daily.  2. We will see you back in 6 months for follow up  3. Please call with any problems or questions at [phone] and ask for triage or my nurse

## 2011-09-28 ENCOUNTER — Other Ambulatory Visit: Payer: Self-pay | Admitting: Family Medicine

## 2011-09-28 DIAGNOSIS — Z1231 Encounter for screening mammogram for malignant neoplasm of breast: Secondary | ICD-10-CM

## 2011-10-05 ENCOUNTER — Other Ambulatory Visit: Payer: Self-pay | Admitting: Family Medicine

## 2011-10-05 NOTE — Telephone Encounter (Signed)
Spoke with patient and she stated that the Hematologist told her to just take one tablet by mouth daily, maybe two if she needs it.  She requested that we leave the directions the same in case she needs to increase it.  Rx refilled electronically.

## 2011-10-05 NOTE — Telephone Encounter (Signed)
Looks like this Rx is not needed anymore according to last hematology note, please advise.

## 2011-10-05 NOTE — Telephone Encounter (Signed)
Check with pt - but I do not think she is on it any longer, thanks

## 2011-10-29 ENCOUNTER — Other Ambulatory Visit: Payer: Self-pay | Admitting: Family Medicine

## 2011-10-30 ENCOUNTER — Ambulatory Visit
Admission: RE | Admit: 2011-10-30 | Discharge: 2011-10-30 | Disposition: A | Discharge status: Home / Self Care | Payer: Commercial Managed Care - PPO | Source: Ambulatory Visit | Attending: Family Medicine | Admitting: Family Medicine

## 2011-10-30 DIAGNOSIS — Z1231 Encounter for screening mammogram for malignant neoplasm of breast: Secondary | ICD-10-CM

## 2011-10-30 NOTE — Telephone Encounter (Signed)
Received refill request electronically. Patient has an appointment scheduled with you 09/13. Is it okay to refill medication?

## 2011-10-30 NOTE — Telephone Encounter (Signed)
Will refill electronically  

## 2011-11-01 ENCOUNTER — Encounter: Payer: Self-pay | Admitting: *Deleted

## 2011-12-21 ENCOUNTER — Other Ambulatory Visit: Payer: Self-pay | Admitting: Family Medicine

## 2011-12-21 NOTE — Telephone Encounter (Signed)
Done

## 2012-01-29 ENCOUNTER — Ambulatory Visit: Payer: Commercial Managed Care - PPO | Admitting: Family Medicine

## 2012-01-30 ENCOUNTER — Ambulatory Visit: Payer: Commercial Managed Care - PPO | Admitting: Family Medicine

## 2012-03-11 ENCOUNTER — Other Ambulatory Visit (INDEPENDENT_AMBULATORY_CARE_PROVIDER_SITE_OTHER): Payer: Commercial Managed Care - PPO

## 2012-03-11 ENCOUNTER — Other Ambulatory Visit: Payer: Self-pay | Admitting: Family Medicine

## 2012-03-11 ENCOUNTER — Telehealth (INDEPENDENT_AMBULATORY_CARE_PROVIDER_SITE_OTHER): Payer: Commercial Managed Care - PPO | Admitting: Family Medicine

## 2012-03-11 DIAGNOSIS — Z0000 Encounter for general adult medical examination without abnormal findings: Secondary | ICD-10-CM

## 2012-03-11 DIAGNOSIS — E785 Hyperlipidemia, unspecified: Secondary | ICD-10-CM

## 2012-03-11 DIAGNOSIS — D649 Anemia, unspecified: Secondary | ICD-10-CM

## 2012-03-11 LAB — COMPREHENSIVE METABOLIC PANEL
ALT: 20 U/L (ref 0–35)
AST: 22 U/L (ref 0–37)
CO2: 26 mEq/L (ref 19–32)
Chloride: 103 mEq/L (ref 96–112)
GFR: 80.48 mL/min (ref >60.00)
Sodium: 138 mEq/L (ref 135–145)
Total Bilirubin: 0.2 mg/dL — ABNORMAL LOW (ref 0.3–1.2)
Total Protein: 7.2 g/dL (ref 6.0–8.3)

## 2012-03-11 LAB — CBC WITH DIFFERENTIAL/PLATELET
Basophils Absolute: 0 10*3/uL (ref 0.0–0.1)
Lymphocytes Relative: 30.3 % (ref 12.0–46.0)
Lymphs Abs: 2.3 10*3/uL (ref 0.7–4.0)
Monocytes Relative: 8.2 % (ref 3.0–12.0)
Platelets: 309 10*3/uL (ref 150.0–400.0)
RDW: 13.1 % (ref 11.5–14.6)

## 2012-03-11 LAB — LIPID PANEL
HDL: 44.8 mg/dL (ref >39.00)
Total CHOL/HDL Ratio: 6

## 2012-03-11 MED ORDER — FLUOXETINE HCL 40 MG PO CAPS: 40.0000 mg | ORAL_CAPSULE | Freq: Every day | ORAL | Status: DC

## 2012-03-11 NOTE — Telephone Encounter (Signed)
She has appt with me this month Please refil for 12 months, thanks

## 2012-03-11 NOTE — Telephone Encounter (Signed)
Message copied by Judy Pimple on Mon Mar 11, 2012  1:51 PM ------      Message from: Alvina Chou      Created: Mon Mar 11, 2012 11:09 AM      Regarding: lab orders asap, please       Labs orders, please. Accidentally release Dr Santo Held orders. I will put those back in , Terri

## 2012-03-11 NOTE — Telephone Encounter (Signed)
Message copied by Judy Pimple on Mon Mar 11, 2012  6:08 PM ------      Message from: Baldomero Lamy      Created: Tue Feb 27, 2012  3:43 PM      Regarding: Cpx labs Tues 9/10       Please order  future cpx labs for pt's upcomming lab appt.      Thanks      Rodney Booze

## 2012-03-11 NOTE — Addendum Note (Signed)
Addended by: Annamarie Major on: 03/11/2012 06:57 PM   Modules accepted: Orders

## 2012-03-11 NOTE — Telephone Encounter (Signed)
Request for Prozac 40 mg. Last filled 08/26/11. Ok to refill?

## 2012-03-12 ENCOUNTER — Other Ambulatory Visit: Payer: Commercial Managed Care - PPO

## 2012-03-12 LAB — LDL CHOLESTEROL, DIRECT: Direct LDL: 202.8 mg/dL

## 2012-03-19 ENCOUNTER — Encounter: Payer: Commercial Managed Care - PPO | Admitting: Family Medicine

## 2012-03-28 ENCOUNTER — Other Ambulatory Visit: Payer: Self-pay | Admitting: Medical Oncology

## 2012-03-28 DIAGNOSIS — D649 Anemia, unspecified: Secondary | ICD-10-CM

## 2012-03-29 ENCOUNTER — Telehealth: Payer: Self-pay | Admitting: *Deleted

## 2012-03-29 ENCOUNTER — Ambulatory Visit (HOSPITAL_BASED_OUTPATIENT_CLINIC_OR_DEPARTMENT_OTHER): Payer: Commercial Managed Care - PPO | Admitting: Oncology

## 2012-03-29 ENCOUNTER — Encounter: Payer: Self-pay | Admitting: Oncology

## 2012-03-29 ENCOUNTER — Other Ambulatory Visit (HOSPITAL_BASED_OUTPATIENT_CLINIC_OR_DEPARTMENT_OTHER): Payer: Commercial Managed Care - PPO | Admitting: Lab

## 2012-03-29 VITALS — BP 126/78 | HR 90 | Temp 98.5°F | Resp 20 | Ht 68.0 in | Wt 203.1 lb

## 2012-03-29 DIAGNOSIS — N951 Menopausal and female climacteric states: Secondary | ICD-10-CM

## 2012-03-29 DIAGNOSIS — D509 Iron deficiency anemia, unspecified: Secondary | ICD-10-CM

## 2012-03-29 DIAGNOSIS — D649 Anemia, unspecified: Secondary | ICD-10-CM

## 2012-03-29 LAB — CBC WITH DIFFERENTIAL/PLATELET
BASO%: 0.5 % (ref 0.0–2.0)
EOS%: 2.1 % (ref 0.0–7.0)
HCT: 42.7 % (ref 34.8–46.6)
MCHC: 34.2 g/dL (ref 31.5–36.0)
MONO#: 0.4 10*3/uL (ref 0.1–0.9)
NEUT%: 52.7 % (ref 38.4–76.8)
RDW: 13 % (ref 11.2–14.5)
WBC: 5.1 10*3/uL (ref 3.9–10.3)
lymph#: 1.9 10*3/uL (ref 0.9–3.3)

## 2012-03-29 LAB — IRON AND TIBC
%SAT: 38 % (ref 20–55)
Iron: 120 ug/dL (ref 42–145)
TIBC: 316 ug/dL (ref 250–470)
UIBC: 196 ug/dL (ref 125–400)

## 2012-03-29 NOTE — Progress Notes (Signed)
OFFICE PROGRESS NOTE  CC  Michele Manns, Michele [name] [address] [place] [address], [place] [address] Dr. Vida Rigger  DIAGNOSIS: 46 year old female with iron deficiency anemia  PRIOR THERAPY:  #1 status post 2 doses of feraheme in April 2012.  #2 she is now on oral iron twice a day tolerating it well.  CURRENT THERAPY ; oral iron daily  INTERVAL HISTORY: Michele [name] 46 y.o. female returns for Followup visit today. Clinically she seems to be doing well she has no complaints she denies any fevers chills night sweats headaches she has no shortness of breath. No chest pains or palpitations. She does take Benadryl once in a while to help her sleep. She occasionally experiences some constipation because of her iron. Patient does have a history of asthma she so she does have some shortness of breath off and on. She has no bleeding problems. Remainder of the tampon review of systems is negative.  MEDICAL HISTORY: Past Medical History  Diagnosis Date  . Anxiety   . Asthma   . HLD (hyperlipidemia)   . Tachycardia     s/p negative cardiac work up with echo  . IBS (irritable bowel syndrome)   . Endometriosis   . Rotator cuff tear, left 2009  . Syncope   . Iron deficiency anemia 1/12  . Hiatal hernia   . Gastric polyp   . Abnormal Pap smear of cervix     cryotx  . Uterine polyp   . Hemorrhoids     ALLERGIES:  is allergic to nsaids; povidone; and propoxyphene-acetaminophen.  MEDICATIONS:  Current Outpatient Prescriptions  Medication Sig Dispense Refill  . albuterol (PROAIR HFA) 108 (90 BASE) MCG/ACT inhaler Inhale 2 puffs into the lungs every 4 (four) hours as needed.        Marland Kitchen FLUoxetine (PROZAC) 40 MG capsule Take 1 capsule (40 mg total) by mouth daily.  30 capsule  11  . fluticasone (FLONASE) 50 MCG/ACT nasal spray Place 2 sprays into the nose daily.        . hydrocortisone (ANUSOL-HC) 2.5 % rectal cream Place 1 application rectally 2 (two) times daily.        .  montelukast (SINGULAIR) 10 MG tablet TAKE  ONE TABLET BY MOUTH NIGHTLY AT BEDTIME  30 tablet  5  . NEXIUM 40 MG capsule TAKE  ONE CAPSULE BY MOUTH EVERY MORNING  30 each  3  . POLY-IRON 150 150 MG capsule TAKE ONE CAPSULE BY MOUTH THREE TIMES DAILY  90 capsule  3  . terconazole (TERAZOL 3) 0.8 % vaginal cream Place 1 applicator vaginally at bedtime.        Marland Kitchen DISCONTD: norethindrone-ethinyl estradiol-iron (MICROGESTIN FE1.5/30) 1.5-30 MG-MCG tablet Take 1 tablet by mouth daily.          SURGICAL HISTORY:  Past Surgical History  Procedure Date  . Hysteroscopy   . Esophagogastroduodenoscopy 1999  . Colonoscopy 1999    small polyps, hemms  . Carpal tunnel release   . Laparoscopy 2006    endometriosis  . Esophagogastroduodenoscopy 2/12    gastric polyp and HH    REVIEW OF SYSTEMS:  Pertinent items are noted in HPI.   PHYSICAL EXAMINATION: General appearance: alert, cooperative and appears stated age Resp: clear to auscultation bilaterally Cardio: regular rate and rhythm, S1, S2 normal, no murmur, click, rub or gallop GI: soft, non-tender; bowel sounds normal; no masses,  no organomegaly Extremities: extremities normal, atraumatic, no cyanosis or edema Neurologic: Alert and  oriented X 3, normal strength and tone. Normal symmetric reflexes. Normal coordination and gait  ECOG PERFORMANCE STATUS: 0 - Asymptomatic  Blood pressure 126/78, pulse 90, temperature 98.5 F (36.9 C), temperature source Oral, resp. rate 20, height 5\' 8"  (1.727 m), weight 203 lb 1.6 oz (92.126 kg).  LABORATORY DATA: Lab Results  Component Value Date   WBC 5.1 03/29/2012   HGB 14.6 03/29/2012   HCT 42.7 03/29/2012   MCV 91.5 03/29/2012   PLT 251 03/29/2012      Chemistry      Component Value Date/Time   NA 138 03/11/2012 1353   K 4.3 03/11/2012 1353   CL 103 03/11/2012 1353   CO2 26 03/11/2012 1353   BUN 9 03/11/2012 1353   CREATININE 0.8 03/11/2012 1353      Component Value Date/Time   CALCIUM 9.2 03/11/2012 1353     ALKPHOS 100 03/11/2012 1353   AST 22 03/11/2012 1353   ALT 20 03/11/2012 1353   BILITOT 0.2* 03/11/2012 1353       RADIOGRAPHIC STUDIES:  No results found.  ASSESSMENT: 46 year old female with  #1 iron deficiency anemia patient's total iron looks great today her H&H is also terrific. At this time she does not need parenteral iron.  #2 perimenopausal symptoms   PLAN:  #1 overall patient is doing well. She has not required any IV iron from me in quite some time.  #2 I will see her back in one years time.  All questions were answered. The patient knows to call the clinic with any problems, questions or concerns. We can certainly see the patient much sooner if necessary.  I spent 15 minutes counseling the patient face to face. The total time spent in the appointment was 30 minutes.    Drue Second, Michele [name] Medical/Oncology [facility] [phone] (beeper) [phone] (Office)  03/29/2012, 12:12 PM

## 2012-03-29 NOTE — Patient Instructions (Addendum)
Doing well  I will see you back in 1 year 

## 2012-03-29 NOTE — Telephone Encounter (Signed)
03-28-2013 starting at 10:30 am with labs

## 2012-04-01 ENCOUNTER — Ambulatory Visit (INDEPENDENT_AMBULATORY_CARE_PROVIDER_SITE_OTHER): Payer: Commercial Managed Care - PPO | Admitting: Family Medicine

## 2012-04-01 ENCOUNTER — Encounter: Payer: Self-pay | Admitting: Family Medicine

## 2012-04-01 ENCOUNTER — Other Ambulatory Visit (HOSPITAL_COMMUNITY)
Admission: RE | Admit: 2012-04-01 | Discharge: 2012-04-01 | Disposition: A | Discharge status: Home / Self Care | Payer: Commercial Managed Care - PPO | Source: Ambulatory Visit | Attending: Family Medicine | Admitting: Family Medicine

## 2012-04-01 VITALS — BP 136/84 | HR 84 | Temp 98.7°F | Ht 68.5 in | Wt 202.8 lb

## 2012-04-01 DIAGNOSIS — Z01419 Encounter for gynecological examination (general) (routine) without abnormal findings: Secondary | ICD-10-CM

## 2012-04-01 DIAGNOSIS — M545 Low back pain, unspecified: Secondary | ICD-10-CM | POA: Insufficient documentation

## 2012-04-01 DIAGNOSIS — R739 Hyperglycemia, unspecified: Secondary | ICD-10-CM

## 2012-04-01 DIAGNOSIS — Z0000 Encounter for general adult medical examination without abnormal findings: Secondary | ICD-10-CM

## 2012-04-01 DIAGNOSIS — Z1151 Encounter for screening for human papillomavirus (HPV): Secondary | ICD-10-CM | POA: Insufficient documentation

## 2012-04-01 DIAGNOSIS — Z23 Encounter for immunization: Secondary | ICD-10-CM

## 2012-04-01 DIAGNOSIS — R7303 Prediabetes: Secondary | ICD-10-CM | POA: Insufficient documentation

## 2012-04-01 DIAGNOSIS — R7309 Other abnormal glucose: Secondary | ICD-10-CM

## 2012-04-01 DIAGNOSIS — M549 Dorsalgia, unspecified: Secondary | ICD-10-CM

## 2012-04-01 DIAGNOSIS — E785 Hyperlipidemia, unspecified: Secondary | ICD-10-CM

## 2012-04-01 MED ORDER — ATORVASTATIN CALCIUM 20 MG PO TABS: 20.0000 mg | ORAL_TABLET | Freq: Every day | ORAL | Status: DC

## 2012-04-01 NOTE — Assessment & Plan Note (Signed)
Quite high - with fair diet Disc goals for lipids and reasons to control them Rev labs with pt Rev low sat fat diet in detail  Will start lipitor 20 mg and update if side eff Fasting lab in 6 weeks

## 2012-04-01 NOTE — Assessment & Plan Note (Signed)
Reviewed health habits including diet and exercise and skin cancer prevention Also reviewed health mt list, fam hx and immunizations   Wellness labs reviewed in detail Flu shot today mammo utd

## 2012-04-01 NOTE — Assessment & Plan Note (Signed)
Non fasting sugar in 130s fam hx strong DM  Obese Disc low glycemic diet and need for more protien  a1c today

## 2012-04-01 NOTE — Progress Notes (Signed)
Subjective:    Patient ID: Michele [name], female    DOB: 01/21/1966, 46 y.o.   MRN: [id number]  HPI Here for health maintenance exam and to review chronic medical problems    Feels ok  Not sleeping well  ? If hormonal    Wt is down 1 lb with bmi of 30 bp is 136/84  Sugar 134- high, forgot to fast - she was eating at 2 am - snacking , also sweet tea  Not watching her sugars right now  Father and sister have DM     Chemistry      Component Value Date/Time   NA 138 03/11/2012 1353   K 4.3 03/11/2012 1353   CL 103 03/11/2012 1353   CO2 26 03/11/2012 1353   BUN 9 03/11/2012 1353   CREATININE 0.8 03/11/2012 1353      Component Value Date/Time   CALCIUM 9.2 03/11/2012 1353   ALKPHOS 100 03/11/2012 1353   AST 22 03/11/2012 1353   ALT 20 03/11/2012 1353   BILITOT 0.2* 03/11/2012 1353     Lab Results  Component Value Date   WBC 5.1 03/29/2012   HGB 14.6 03/29/2012   HCT 42.7 03/29/2012   MCV 91.5 03/29/2012   PLT 251 03/29/2012   Iron def is much better   Flu shot- wants to get that today  mammo 4/13 No lumps or changes on self exam   Pap - saw gyn July/ august , did not have a pap - just seeing her for the menopause  Needs exam today  Pap was 1/12  On combipatch- with no withdrawal phase  Spotting , no regular period  A lot of hot flashes and night sweats   colonosc 99- because of the anemia at the time , never found anything   Hyperlipidemia Lab Results  Component Value Date   CHOL 250* 03/11/2012   CHOL 199 07/13/2010   CHOL 261* 02/04/2008   Lab Results  Component Value Date   HDL 44.80 03/11/2012   HDL 40.70 07/13/2010   HDL 40.9 02/04/2008   Lab Results  Component Value Date   LDLCALC 135* 07/13/2010   Lab Results  Component Value Date   TRIG 185.0* 03/11/2012   TRIG 117.0 07/13/2010   TRIG 176* 02/04/2008   Lab Results  Component Value Date   CHOLHDL 6 03/11/2012   CHOLHDL 5 07/13/2010   CHOLHDL 6.3 CALC 02/04/2008   Lab Results  Component Value Date   LDLDIRECT 202.8  03/11/2012   LDLDIRECT 198.5 02/04/2008   LDLDIRECT 171.7 10/30/2007    Has not been on chol med - she would be open to it  Is trying to eat leaner meats like venison, occ fried food, occ shellfish  Too much high fat dairy  Cannot exercise due to asthma lately   Patient Active Problem List  Diagnosis  . HYPERLIPIDEMIA  . ANXIETY  . HEMORRHOIDS, INTERNAL  . ASTHMA  . IBS  . MICROSCOPIC HEMATURIA  . ENDOMETRIOSIS  . COLONIC POLYPS, HX OF  . MIGRAINES, HX OF  . ANEMIA  . FREQUENCY, URINARY  . GERD  . Bug bite of hand  . Routine general medical examination at a health care facility  . Routine gynecological examination  . Hyperglycemia  . Back pain   Past Medical History  Diagnosis Date  . Anxiety   . Asthma   . HLD (hyperlipidemia)   . Tachycardia     s/p negative cardiac work up with echo  .  IBS (irritable bowel syndrome)   . Endometriosis   . Rotator cuff tear, left 2009  . Syncope   . Iron deficiency anemia 1/12  . Hiatal hernia   . Gastric polyp   . Abnormal Pap smear of cervix     cryotx  . Uterine polyp   . Hemorrhoids    Past Surgical History  Procedure Date  . Hysteroscopy   . Esophagogastroduodenoscopy 1999  . Colonoscopy 1999    small polyps, hemms  . Carpal tunnel release   . Laparoscopy 2006    endometriosis  . Esophagogastroduodenoscopy 2/12    gastric polyp and HH   History  Substance Use Topics  . Smoking status: Former Games developer  . Smokeless tobacco: Not on file  . Alcohol Use: Yes     Occasional   Family History  Problem Relation Age of Onset  . Hypertension Father   . Diabetes Father   . Iron deficiency Father     anemia  . Other Father     Barrett's esoph  . Stroke Mother   . Migraines Mother   . Hyperlipidemia Mother   . Diabetes Sister   . Hyperlipidemia Sister    Allergies  Allergen Reactions  . Nsaids     REACTION: GI upset  . Povidone     REACTION: rash  . Propoxyphene-Acetaminophen     REACTION: itchy   Current  Outpatient Prescriptions on File Prior to Visit  Medication Sig Dispense Refill  . albuterol (PROAIR HFA) 108 (90 BASE) MCG/ACT inhaler Inhale 2 puffs into the lungs every 4 (four) hours as needed.        . COMBIPATCH 0.05-0.14 MG/DAY       . FLUoxetine (PROZAC) 40 MG capsule Take 1 capsule (40 mg total) by mouth daily.  30 capsule  11  . fluticasone (FLONASE) 50 MCG/ACT nasal spray Place 2 sprays into the nose daily.        . hydrocortisone (ANUSOL-HC) 2.5 % rectal cream Place 1 application rectally as needed.       . montelukast (SINGULAIR) 10 MG tablet TAKE  ONE TABLET BY MOUTH NIGHTLY AT BEDTIME  30 tablet  5  . NEXIUM 40 MG capsule TAKE  ONE CAPSULE BY MOUTH EVERY MORNING  30 each  3  . POLY-IRON 150 150 MG capsule TAKE ONE CAPSULE BY MOUTH THREE TIMES DAILY  90 capsule  3  . atorvastatin (LIPITOR) 20 MG tablet Take 1 tablet (20 mg total) by mouth daily.  30 tablet  3  . terconazole (TERAZOL 3) 0.8 % vaginal cream Place 1 applicator vaginally at bedtime.        Marland Kitchen DISCONTD: norethindrone-ethinyl estradiol-iron (MICROGESTIN FE1.5/30) 1.5-30 MG-MCG tablet Take 1 tablet by mouth daily.          Review of Systems ROS  Review of Systems  Constitutional: Negative for fever, appetite change, fatigue and unexpected weight change.  Eyes: Negative for pain and visual disturbance.  Respiratory: Negative for cough and pos for sob/ wheeze on exertion  Cardiovascular: Negative for cp or palpitations    Gastrointestinal: Negative for nausea, diarrhea and constipation.  Genitourinary: Negative for urgency and frequency.  Skin: Negative for pallor or rash   MSK pos for mid and low back pain -on going  Neurological: Negative for weakness, light-headedness, numbness and headaches.  Hematological: Negative for adenopathy. Does not bruise/bleed easily.  Psychiatric/Behavioral: Negative for dysphoric mood. The patient is not nervous/anxious.         Objective:  Physical Exam  Constitutional: She  appears well-developed and well-nourished. No distress.       obese and well appearing   HENT:  Head: Normocephalic and atraumatic.  Right Ear: External ear normal.  Left Ear: External ear normal.  Nose: Nose normal.  Mouth/Throat: Oropharynx is clear and moist.  Eyes: Conjunctivae normal and EOM are normal. Pupils are equal, round, and reactive to light. Right eye exhibits no discharge. Left eye exhibits no discharge. No scleral icterus.  Neck: Normal range of motion. Neck supple. No JVD present. Carotid bruit is not present. No thyromegaly present.  Cardiovascular: Normal rate, regular rhythm, normal heart sounds and intact distal pulses.  Exam reveals no gallop.   Pulmonary/Chest: Effort normal and breath sounds normal. No respiratory distress. She has no wheezes.  Abdominal: Soft. Bowel sounds are normal. She exhibits no distension, no abdominal bruit and no mass. There is no tenderness.  Genitourinary: Vagina normal and uterus normal. No breast swelling, tenderness, discharge or bleeding. There is no rash, tenderness or lesion on the right labia. There is no rash, tenderness or lesion on the left labia. Uterus is not enlarged and not tender. Cervix exhibits no motion tenderness, no discharge and no friability. Right adnexum displays no mass, no tenderness and no fullness. Left adnexum displays no mass, no tenderness and no fullness. No bleeding around the vagina. No vaginal discharge found.       Breast exam: No mass, nodules, thickening, tenderness, bulging, retraction, inflamation, nipple discharge or skin changes noted.  No axillary or clavicular LA.  Chaperoned exam.    Musculoskeletal: Normal range of motion. She exhibits no edema and no tenderness.  Lymphadenopathy:    She has no cervical adenopathy.  Neurological: She is alert. She has normal reflexes. No cranial nerve deficit. She exhibits normal muscle tone. Coordination normal.  Skin: Skin is warm and dry. No rash noted. No  erythema. No pallor.  Psychiatric: She has a normal mood and affect.          Assessment & Plan:

## 2012-04-01 NOTE — Assessment & Plan Note (Signed)
Annual exam with pap done On hormone patch continuous from her gyn for severe perimenopausal symptoms
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
## 2012-04-01 NOTE — Patient Instructions (Addendum)
Since your asthma is worse lately and interfering with exercise -please make follow up appt to discuss that Flu shot today  Start lipitor  Schedule fasting lab in 6 weeks to check cholesterol on that  Sugar is a bit high- checking a1c today  Try to start cutting back on sugar  Make appt with Dr Patsy Lager for back pain

## 2012-04-03 ENCOUNTER — Ambulatory Visit (INDEPENDENT_AMBULATORY_CARE_PROVIDER_SITE_OTHER): Payer: Commercial Managed Care - PPO | Admitting: Family Medicine

## 2012-04-03 ENCOUNTER — Encounter: Payer: Self-pay | Admitting: Family Medicine

## 2012-04-03 ENCOUNTER — Telehealth: Payer: Self-pay | Admitting: *Deleted

## 2012-04-03 VITALS — BP 124/86 | HR 92 | Temp 99.1°F | Resp 20 | Ht 68.5 in | Wt 202.5 lb

## 2012-04-03 DIAGNOSIS — M545 Low back pain, unspecified: Secondary | ICD-10-CM

## 2012-04-03 DIAGNOSIS — M546 Pain in thoracic spine: Secondary | ICD-10-CM

## 2012-04-03 MED ORDER — TRAMADOL HCL 50 MG PO TABS: 50.0000 mg | ORAL_TABLET | Freq: Four times a day (QID) | ORAL | Status: DC | PRN

## 2012-04-03 MED ORDER — PREDNISONE 20 MG PO TABS: ORAL_TABLET | ORAL | Status: DC

## 2012-04-03 NOTE — Telephone Encounter (Signed)
Michele [name] saw Dr. Patsy Lager this morning and was given Prednisone to take for her back pain. Michele [name] states that she is scheduled to see you next week and she does not want to take anything that will affect any test that you might do regarding her asthma. Please advise if it is okay for her to take the Prednisone.

## 2012-04-03 NOTE — Telephone Encounter (Signed)
Notified pt to start taking the prednisone, pt r/s appt to 04/29/12 (2wks later)

## 2012-04-03 NOTE — Telephone Encounter (Signed)
Tell her to take the prednisone (she is right- this will help her asthma symptoms too) Re schedule her visit with me for 2 weeks after she finishes the prednisone-thanks
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
## 2012-04-04 ENCOUNTER — Encounter: Payer: Self-pay | Admitting: *Deleted

## 2012-04-04 ENCOUNTER — Telehealth: Payer: Self-pay | Admitting: *Deleted

## 2012-04-04 NOTE — Telephone Encounter (Signed)
Per MD, notified pt iron levels normal. LMOVM. Requested pt to call back confirming message received

## 2012-04-04 NOTE — Telephone Encounter (Signed)
Message copied by Cooper Render on Thu Apr 04, 2012  9:33 AM ------      Message from: Victorino December      Created: Tue Apr 02, 2012 11:03 AM       Call patient: iron levels normal

## 2012-04-08 ENCOUNTER — Ambulatory Visit: Payer: Commercial Managed Care - PPO | Admitting: Family Medicine

## 2012-04-21 ENCOUNTER — Other Ambulatory Visit: Payer: Self-pay | Admitting: Family Medicine

## 2012-04-29 ENCOUNTER — Ambulatory Visit (INDEPENDENT_AMBULATORY_CARE_PROVIDER_SITE_OTHER): Payer: Commercial Managed Care - PPO | Admitting: Family Medicine

## 2012-04-29 ENCOUNTER — Encounter: Payer: Self-pay | Admitting: Family Medicine

## 2012-04-29 ENCOUNTER — Ambulatory Visit (INDEPENDENT_AMBULATORY_CARE_PROVIDER_SITE_OTHER)
Admission: RE | Admit: 2012-04-29 | Discharge: 2012-04-29 | Disposition: A | Discharge status: Home / Self Care | Payer: Commercial Managed Care - PPO | Source: Ambulatory Visit | Attending: Family Medicine | Admitting: Family Medicine

## 2012-04-29 VITALS — BP 116/78 | HR 66 | Temp 98.3°F | Ht 68.5 in | Wt 205.0 lb

## 2012-04-29 DIAGNOSIS — R7309 Other abnormal glucose: Secondary | ICD-10-CM

## 2012-04-29 DIAGNOSIS — R739 Hyperglycemia, unspecified: Secondary | ICD-10-CM

## 2012-04-29 DIAGNOSIS — J45909 Unspecified asthma, uncomplicated: Secondary | ICD-10-CM

## 2012-04-29 MED ORDER — FLUTICASONE-SALMETEROL 250-50 MCG/DOSE IN AEPB: 1.0000 | INHALATION_SPRAY | Freq: Two times a day (BID) | RESPIRATORY_TRACT | Status: DC

## 2012-04-29 NOTE — Assessment & Plan Note (Signed)
Has been worsening over the past several years- exercise induced (to the point of making her sedentary) Last visit with allergist / spirometry was years ago  Will add advair 250/50  Continue rescue inhaler- disc diff between mt and rescue tx in detail  Also continue singulair cxr today  F/u 3-4 weeks  Consider spirometry

## 2012-04-29 NOTE — Assessment & Plan Note (Signed)
Fasting with reassuring a1c below 6 Disc low glycemic diet and need for wt loss Will continue to follow

## 2012-04-29 NOTE — Patient Instructions (Addendum)
Stay on singulair Also start advair 250/50 1 inhalation twice daily - I sent this to your pharmacy Chest xray today  Next time we will discuss a spirometry test  Use your rescue inhaler when you need it - and if necessary - before exercise or activity

## 2012-04-29 NOTE — Progress Notes (Signed)
Subjective:    Patient ID: Michele [name], female    DOB: Jan 23, 1966, 46 y.o.   MRN: [id number]  HPI Asthma is worse  Really worse with exertion- stairs/ walking up and down driveway  Weather change - will sometimes bother her - especially humidity   At night in bed- not too bad   Takes nexium for gerd and HH- no problems with that   This has been worsening over several years- gradually worse since her last pregnancy   No asthma as a kid Does have nasal allergies and no eczema  Uses her proair inhaler more than twice a week depending on her activity  No cp  Takes the singulair daily- used to really help more   Last spirometry test was done at an allergist -- was pos for asthma  Does not remember who she used to see   Mostly she has sob/ with wheezing   Lab Results  Component Value Date   WBC 5.1 03/29/2012   HGB 14.6 03/29/2012   HCT 42.7 03/29/2012   MCV 91.5 03/29/2012   PLT 251 03/29/2012    Patient Active Problem List  Diagnosis  . HYPERLIPIDEMIA  . ANXIETY  . HEMORRHOIDS, INTERNAL  . ASTHMA  . IBS  . MICROSCOPIC HEMATURIA  . ENDOMETRIOSIS  . COLONIC POLYPS, HX OF  . MIGRAINES, HX OF  . ANEMIA  . FREQUENCY, URINARY  . GERD  . Bug bite of hand  . Routine general medical examination at a health care facility  . Routine gynecological examination  . Hyperglycemia  . Back pain   Past Medical History  Diagnosis Date  . Anxiety   . Asthma   . HLD (hyperlipidemia)   . Tachycardia     s/p negative cardiac work up with echo  . IBS (irritable bowel syndrome)   . Endometriosis   . Rotator cuff tear, left 2009  . Syncope   . Iron deficiency anemia 1/12  . Hiatal hernia   . Gastric polyp   . Abnormal Pap smear of cervix     cryotx  . Uterine polyp   . Hemorrhoids    Past Surgical History  Procedure Date  . Hysteroscopy   . Esophagogastroduodenoscopy 1999  . Colonoscopy 1999    small polyps, hemms  . Carpal tunnel release   . Laparoscopy 2006   endometriosis  . Esophagogastroduodenoscopy 2/12    gastric polyp and HH   History  Substance Use Topics  . Smoking status: Former Games developer  . Smokeless tobacco: Not on file  . Alcohol Use: Yes     Occasional   Family History  Problem Relation Age of Onset  . Hypertension Father   . Diabetes Father   . Iron deficiency Father     anemia  . Other Father     Barrett's esoph  . Stroke Mother   . Migraines Mother   . Hyperlipidemia Mother   . Diabetes Sister   . Hyperlipidemia Sister    Allergies  Allergen Reactions  . Nsaids     REACTION: GI upset  . Povidone     REACTION: rash  . Propoxyphene-Acetaminophen     REACTION: itchy   Current Outpatient Prescriptions on File Prior to Visit  Medication Sig Dispense Refill  . albuterol (PROAIR HFA) 108 (90 BASE) MCG/ACT inhaler Inhale 2 puffs into the lungs every 4 (four) hours as needed.        Marland Kitchen atorvastatin (LIPITOR) 20 MG tablet Take 1 tablet (20 mg  total) by mouth daily.  30 tablet  3  . COMBIPATCH 0.05-0.14 MG/DAY Place 1 patch onto the skin 2 (two) times a week.       Marland Kitchen FLUoxetine (PROZAC) 40 MG capsule Take 1 capsule (40 mg total) by mouth daily.  30 capsule  11  . fluticasone (FLONASE) 50 MCG/ACT nasal spray Place 2 sprays into the nose daily.        . hydrocortisone (ANUSOL-HC) 2.5 % rectal cream Place 1 application rectally as needed.       . montelukast (SINGULAIR) 10 MG tablet TAKE  ONE TABLET BY MOUTH NIGHTLY AT BEDTIME  30 tablet  0  . NEXIUM 40 MG capsule TAKE  ONE CAPSULE BY MOUTH EVERY MORNING  30 each  3  . traMADol (ULTRAM) 50 MG tablet Take 1 tablet (50 mg total) by mouth every 6 (six) hours as needed for pain.  50 tablet  2  . DISCONTD: POLY-IRON 150 150 MG capsule TAKE ONE CAPSULE BY MOUTH THREE TIMES DAILY  90 capsule  3  . DISCONTD: norethindrone-ethinyl estradiol-iron (MICROGESTIN FE1.5/30) 1.5-30 MG-MCG tablet Take 1 tablet by mouth daily.           Review of Systems Review of Systems  Constitutional:  Negative for fever, appetite change, fatigue and unexpected weight change.  Eyes: Negative for pain and visual disturbance.  Respiratory: Negative for cough and pos for wheeze and sob intermittently , neg for mucous production or CP  Cardiovascular: Negative for cp or palpitations   neg for leg edema  Gastrointestinal: Negative for nausea, diarrhea and constipation.  Genitourinary: Negative for urgency and frequency.  Skin: Negative for pallor or rash   Neurological: Negative for weakness, light-headedness, numbness and headaches.  Hematological: Negative for adenopathy. Does not bruise/bleed easily.  Psychiatric/Behavioral: Negative for dysphoric mood. The patient is not nervous/anxious.         Objective:   Physical Exam  Constitutional: She appears well-developed and well-nourished. No distress.       obese and well appearing   HENT:  Head: Normocephalic and atraumatic.  Eyes: Conjunctivae normal and EOM are normal. Pupils are equal, round, and reactive to light. No scleral icterus.  Neck: Normal range of motion. Neck supple. No JVD present. Carotid bruit is not present. No thyromegaly present.  Cardiovascular: Normal rate, regular rhythm, normal heart sounds and intact distal pulses.  Exam reveals no gallop.   Pulmonary/Chest: Effort normal. No respiratory distress. She has wheezes. She has no rales. She exhibits no tenderness.       Wheeze only on forced exp Otherwise good air exch Not sob at rest   Abdominal: Soft. Bowel sounds are normal. She exhibits no distension. There is no tenderness.  Musculoskeletal: She exhibits no edema.  Lymphadenopathy:    She has no cervical adenopathy.  Neurological: She is alert. She has normal reflexes.  Skin: Skin is warm and dry. No rash noted. No pallor.  Psychiatric: She has a normal mood and affect.          Assessment & Plan:

## 2012-05-13 ENCOUNTER — Other Ambulatory Visit: Payer: Commercial Managed Care - PPO

## 2012-05-27 ENCOUNTER — Ambulatory Visit: Payer: Commercial Managed Care - PPO | Admitting: Family Medicine

## 2012-05-27 ENCOUNTER — Other Ambulatory Visit: Payer: Self-pay | Admitting: Family Medicine

## 2012-05-27 DIAGNOSIS — Z0289 Encounter for other administrative examinations: Secondary | ICD-10-CM

## 2012-06-12 ENCOUNTER — Other Ambulatory Visit: Payer: Self-pay | Admitting: Family Medicine

## 2012-08-06 ENCOUNTER — Other Ambulatory Visit: Payer: Self-pay | Admitting: Family Medicine

## 2012-08-27 ENCOUNTER — Other Ambulatory Visit: Payer: Self-pay | Admitting: Family Medicine

## 2012-12-23 ENCOUNTER — Other Ambulatory Visit: Payer: Self-pay | Admitting: Family Medicine

## 2012-12-23 ENCOUNTER — Encounter: Payer: Self-pay | Admitting: *Deleted

## 2012-12-23 NOTE — Telephone Encounter (Signed)
No recent appt and no future appts, left voicemail requesting pt to call office so we can schedule a f/u before I refill med

## 2012-12-23 NOTE — Telephone Encounter (Signed)
Pt scheduled a f/u so med refill until then

## 2013-01-20 ENCOUNTER — Other Ambulatory Visit: Payer: Self-pay | Admitting: Family Medicine

## 2013-01-21 ENCOUNTER — Ambulatory Visit: Payer: Commercial Managed Care - PPO | Admitting: Family Medicine

## 2013-02-11 ENCOUNTER — Other Ambulatory Visit: Payer: Self-pay | Admitting: Family Medicine

## 2013-02-12 ENCOUNTER — Encounter: Payer: Self-pay | Admitting: Family Medicine

## 2013-02-12 ENCOUNTER — Ambulatory Visit (INDEPENDENT_AMBULATORY_CARE_PROVIDER_SITE_OTHER): Payer: Commercial Managed Care - PPO | Admitting: Family Medicine

## 2013-02-12 VITALS — BP 106/68 | HR 80 | Temp 98.7°F | Ht 68.5 in | Wt 200.5 lb

## 2013-02-12 DIAGNOSIS — D649 Anemia, unspecified: Secondary | ICD-10-CM

## 2013-02-12 DIAGNOSIS — R5383 Other fatigue: Secondary | ICD-10-CM | POA: Insufficient documentation

## 2013-02-12 DIAGNOSIS — R7309 Other abnormal glucose: Secondary | ICD-10-CM

## 2013-02-12 DIAGNOSIS — R5381 Other malaise: Secondary | ICD-10-CM

## 2013-02-12 DIAGNOSIS — R739 Hyperglycemia, unspecified: Secondary | ICD-10-CM

## 2013-02-12 DIAGNOSIS — E785 Hyperlipidemia, unspecified: Secondary | ICD-10-CM

## 2013-02-12 LAB — CBC WITH DIFFERENTIAL/PLATELET
Basophils Absolute: 0 10*3/uL (ref 0.0–0.1)
Eosinophils Relative: 4.7 % (ref 0.0–5.0)
Monocytes Absolute: 0.4 10*3/uL (ref 0.1–1.0)
Monocytes Relative: 6.6 % (ref 3.0–12.0)
Neutrophils Relative %: 67 % (ref 43.0–77.0)
Platelets: 270 10*3/uL (ref 150.0–400.0)
RDW: 12.9 % (ref 11.5–14.6)
WBC: 6.3 10*3/uL (ref 4.5–10.5)

## 2013-02-12 LAB — COMPREHENSIVE METABOLIC PANEL
ALT: 22 U/L (ref 0–35)
Albumin: 4 g/dL (ref 3.5–5.2)
Alkaline Phosphatase: 112 U/L (ref 39–117)
CO2: 27 mEq/L (ref 19–32)
Glucose, Bld: 103 mg/dL — ABNORMAL HIGH (ref 70–99)
Potassium: 4.5 mEq/L (ref 3.5–5.1)
Sodium: 140 mEq/L (ref 135–145)
Total Bilirubin: 0.8 mg/dL (ref 0.3–1.2)
Total Protein: 7.2 g/dL (ref 6.0–8.3)

## 2013-02-12 LAB — LIPID PANEL
Cholesterol: 164 mg/dL (ref 0–200)
LDL Cholesterol: 102 mg/dL — ABNORMAL HIGH (ref 0–99)
Total CHOL/HDL Ratio: 3
Triglycerides: 48 mg/dL (ref 0.0–149.0)
VLDL: 9.6 mg/dL (ref 0.0–40.0)

## 2013-02-12 LAB — TSH: TSH: 1.57 u[IU]/mL (ref 0.35–5.50)

## 2013-02-12 MED ORDER — MONTELUKAST SODIUM 10 MG PO TABS: 10.0000 mg | ORAL_TABLET | Freq: Every day | ORAL | Status: DC

## 2013-02-12 MED ORDER — FLUOXETINE HCL 40 MG PO CAPS: 40.0000 mg | ORAL_CAPSULE | Freq: Every day | ORAL | Status: DC

## 2013-02-12 MED ORDER — ATORVASTATIN CALCIUM 20 MG PO TABS: 20.0000 mg | ORAL_TABLET | Freq: Every day | ORAL | Status: DC

## 2013-02-12 NOTE — Progress Notes (Signed)
Subjective:    Patient ID: Michele [name], female    DOB: 05-07-1966, 47 y.o.   MRN: [id number]  HPI Here for f/u of chronic medical problems  Has been doing fair - but very run down in general  No stamina  Physically and mentally - and generally clumsy also and concentration is not as good   ? About her depression  Feels a bit sad at times  Was out of work - for 1.5 years -- and now just went back to work -- getting used to it but likes her job  ? If anemia is worse  Taking iron   Has spotty periods at this time-nothing heavy No hrt Has not stopped yet   Wt is down 5 lb with bmi of 30 Not eating as much - less appetite In terms of exercise-she does walk her dogs in the field by her house     Hyperglycemia Lab Results  Component Value Date   HGBA1C 5.7 04/01/2012    Hyperlipidemia Statin and diet- lipitor is tolerated well and overdue for labs  Lab Results  Component Value Date   CHOL 250* 03/11/2012   HDL 44.80 03/11/2012   LDLCALC 135* 07/13/2010   LDLDIRECT 202.8 03/11/2012   TRIG 185.0* 03/11/2012   CHOLHDL 6 03/11/2012    Iron def anemia Lab Results  Component Value Date   WBC 5.1 03/29/2012   HGB 14.6 03/29/2012   HCT 42.7 03/29/2012   MCV 91.5 03/29/2012   PLT 251 03/29/2012    Patient Active Problem List   Diagnosis Date Noted  . Routine gynecological examination 04/01/2012  . Hyperglycemia 04/01/2012  . Back pain 04/01/2012  . Routine general medical examination at a health care facility 03/11/2012  . Bug bite of hand 01/19/2011  . GERD 08/26/2010  . ANEMIA 07/19/2010  . FREQUENCY, URINARY 07/06/2010  . MICROSCOPIC HEMATURIA 06/09/2010  . COLONIC POLYPS, HX OF 11/15/2007  . IBS 10/08/2007  . ENDOMETRIOSIS 10/08/2007  . HYPERLIPIDEMIA 10/30/2006  . ANXIETY 10/30/2006  . HEMORRHOIDS, INTERNAL 10/30/2006  . ASTHMA 10/30/2006  . MIGRAINES, HX OF 10/30/2006   Past Medical History  Diagnosis Date  . Anxiety   . Asthma   . HLD (hyperlipidemia)   .  Tachycardia     s/p negative cardiac work up with echo  . IBS (irritable bowel syndrome)   . Endometriosis   . Rotator cuff tear, left 2009  . Syncope   . Iron deficiency anemia 1/12  . Hiatal hernia   . Gastric polyp   . Abnormal Pap smear of cervix     cryotx  . Uterine polyp   . Hemorrhoids    Past Surgical History  Procedure Laterality Date  . Hysteroscopy    . Esophagogastroduodenoscopy  1999  . Colonoscopy  1999    small polyps, hemms  . Carpal tunnel release    . Laparoscopy  2006    endometriosis  . Esophagogastroduodenoscopy  2/12    gastric polyp and HH   History  Substance Use Topics  . Smoking status: Former Games developer  . Smokeless tobacco: Not on file  . Alcohol Use: Yes     Comment: Occasional   Family History  Problem Relation Age of Onset  . Hypertension Father   . Diabetes Father   . Iron deficiency Father     anemia  . Other Father     Barrett's esoph  . Stroke Mother   . Migraines Mother   . Hyperlipidemia Mother   .  Diabetes Sister   . Hyperlipidemia Sister    Allergies  Allergen Reactions  . Nsaids     REACTION: GI upset  . Povidone     REACTION: rash  . Propoxyphene-Acetaminophen     REACTION: itchy   Current Outpatient Prescriptions on File Prior to Visit  Medication Sig Dispense Refill  . albuterol (PROAIR HFA) 108 (90 BASE) MCG/ACT inhaler Inhale 2 puffs into the lungs every 4 (four) hours as needed.        Marland Kitchen atorvastatin (LIPITOR) 20 MG tablet TAKE ONE TABLET BY MOUTH ONE TIME DAILY  30 tablet  0  . FLUoxetine (PROZAC) 40 MG capsule Take 1 capsule (40 mg total) by mouth daily.  30 capsule  11  . fluticasone (FLONASE) 50 MCG/ACT nasal spray Place 2 sprays into the nose as needed.       . hydrocortisone (ANUSOL-HC) 2.5 % rectal cream Place 1 application rectally as needed.       . montelukast (SINGULAIR) 10 MG tablet TAKE  ONE TABLET BY MOUTH NIGHTLY AT BEDTIME  30 tablet  0  . traMADol (ULTRAM) 50 MG tablet Take 1 tablet (50 mg  total) by mouth every 6 (six) hours as needed for pain.  50 tablet  2  . [DISCONTINUED] norethindrone-ethinyl estradiol-iron (MICROGESTIN FE1.5/30) 1.5-30 MG-MCG tablet Take 1 tablet by mouth daily.         No current facility-administered medications on file prior to visit.    Review of Systems Review of Systems  Constitutional: Negative for fever,and unexpected weight change. pos for fatigue/poor sleep and dec appetite Eyes: Negative for pain and visual disturbance.  Respiratory: Negative for cough and shortness of breath.   Cardiovascular: Negative for cp or palpitations    Gastrointestinal: Negative for nausea, diarrhea and constipation.  Genitourinary: Negative for urgency and frequency.  Skin: Negative for pallor or rash   Neurological: Negative for weakness, light-headedness, numbness and headaches.  Hematological: Negative for adenopathy. Does not bruise/bleed easily.  Psychiatric/Behavioral: pos  for dysphoric mood (mild). The patient is not nervous/anxious.         Objective:   Physical Exam  Constitutional: She appears well-developed and well-nourished. No distress.  obese and well appearing   HENT:  Head: Normocephalic and atraumatic.  Mouth/Throat: Oropharynx is clear and moist.  Eyes: Conjunctivae and EOM are normal. Pupils are equal, round, and reactive to light. Right eye exhibits no discharge. Left eye exhibits no discharge. No scleral icterus.  Neck: Normal range of motion. Neck supple. No JVD present. Carotid bruit is not present. No thyromegaly present.  Cardiovascular: Normal rate, regular rhythm, normal heart sounds and intact distal pulses.  Exam reveals no gallop.   Pulmonary/Chest: Effort normal and breath sounds normal. No respiratory distress. She has no wheezes. She has no rales.  Abdominal: Soft. Bowel sounds are normal. She exhibits no distension, no abdominal bruit and no mass. There is no tenderness.  Musculoskeletal: She exhibits no edema and no  tenderness.  Lymphadenopathy:    She has no cervical adenopathy.  Neurological: She is alert. She has normal reflexes. No cranial nerve deficit. She exhibits normal muscle tone. Coordination normal.  Skin: Skin is warm and dry. No rash noted. No erythema. No pallor.  Skin tone looks nl-no pallor  Psychiatric: Her speech is normal and behavior is normal. Thought content normal. Her mood appears not anxious. Her affect is blunt. Thought content is not paranoid. She does not exhibit a depressed mood. She expresses no homicidal and  no suicidal ideation.          Assessment & Plan:

## 2013-02-12 NOTE — Patient Instructions (Addendum)
I sent your px to the pharmacy  Labs today for fatigue If everything is ok - I may want to increase your prozac Try to work on getting some exercise

## 2013-02-13 ENCOUNTER — Telehealth: Payer: Self-pay | Admitting: Family Medicine

## 2013-02-13 MED ORDER — FLUOXETINE HCL 20 MG PO TABS: ORAL_TABLET | ORAL | Status: DC

## 2013-02-13 NOTE — Assessment & Plan Note (Signed)
Suspect combination of factors incl perimenopause/anemia and depression Lab today Will inc prozac to 60 mg if all nl  Disc imp of exercise

## 2013-02-13 NOTE — Telephone Encounter (Signed)
Pt notified of lab results and to increase prozac to 60mg , pt will call back to schedule f/u because she wasn't around her calender

## 2013-02-13 NOTE — Assessment & Plan Note (Signed)
This has been well controlled with iron/ heme following - but fatigue is worse  Lab today

## 2013-02-13 NOTE — Assessment & Plan Note (Signed)
Lab today-pt is fatigued

## 2013-02-13 NOTE — Telephone Encounter (Signed)
Labs are all ok - sugar up a bit so watch diet Lets inc prozac to 60 mg and f/u in 4-6 wk Please call in px

## 2013-02-17 ENCOUNTER — Telehealth: Payer: Self-pay

## 2013-02-17 MED ORDER — FLUCONAZOLE 150 MG PO TABS: 150.0000 mg | ORAL_TABLET | Freq: Once | ORAL | Status: DC

## 2013-02-17 NOTE — Telephone Encounter (Signed)
Pt called back pt has thick white vaginal discharge with vaginal and perineal itching with swelling.Symptoms started 02/14/13. Pt said just seen 02/12/13. Pt request med sent to [facility] on [address] instead of coming back in for another appt.Please advise.

## 2013-02-17 NOTE — Telephone Encounter (Signed)
That sounds like a yeast infection We can try diflucan- I will send [facility] to [facility]  If no improvement follow up  Hold your lipitor the day you take the pill

## 2013-02-17 NOTE — Telephone Encounter (Signed)
Pt left v/m requesting med for yeast infection. I called pt back and left v/m for pt to cb; more info on symptoms and pharmacy.

## 2013-02-18 NOTE — Telephone Encounter (Signed)
Pt.notified

## 2013-03-28 ENCOUNTER — Encounter: Payer: Self-pay | Admitting: Oncology

## 2013-03-28 ENCOUNTER — Other Ambulatory Visit (HOSPITAL_BASED_OUTPATIENT_CLINIC_OR_DEPARTMENT_OTHER): Payer: Commercial Managed Care - PPO | Admitting: Lab

## 2013-03-28 ENCOUNTER — Ambulatory Visit (HOSPITAL_BASED_OUTPATIENT_CLINIC_OR_DEPARTMENT_OTHER): Payer: Commercial Managed Care - PPO | Admitting: Oncology

## 2013-03-28 VITALS — BP 124/78 | HR 81 | Temp 98.3°F | Resp 20 | Ht 68.5 in | Wt 203.4 lb

## 2013-03-28 DIAGNOSIS — D649 Anemia, unspecified: Secondary | ICD-10-CM

## 2013-03-28 DIAGNOSIS — D509 Iron deficiency anemia, unspecified: Secondary | ICD-10-CM

## 2013-03-28 LAB — CBC WITH DIFFERENTIAL/PLATELET
BASO%: 0.5 % (ref 0.0–2.0)
EOS%: 4.9 % (ref 0.0–7.0)
HGB: 13.5 g/dL (ref 11.6–15.9)
MCH: 30.5 pg (ref 25.1–34.0)
MCHC: 34 g/dL (ref 31.5–36.0)
MCV: 89.5 fL (ref 79.5–101.0)
MONO%: 8.1 % (ref 0.0–14.0)
RBC: 4.43 10*6/uL (ref 3.70–5.45)
RDW: 12.9 % (ref 11.2–14.5)
lymph#: 1.6 10*3/uL (ref 0.9–3.3)

## 2013-03-28 LAB — IRON AND TIBC CHCC
%SAT: 25 % (ref 21–57)
Iron: 75 ug/dL (ref 41–142)
TIBC: 298 ug/dL (ref 236–444)
UIBC: 222 ug/dL (ref 120–384)

## 2013-03-28 LAB — BASIC METABOLIC PANEL (CC13)
BUN: 8.5 mg/dL (ref 7.0–26.0)
Chloride: 105 mEq/L (ref 98–109)
Potassium: 4.3 mEq/L (ref 3.5–5.1)
Sodium: 140 mEq/L (ref 136–145)

## 2013-03-28 LAB — FERRITIN CHCC: Ferritin: 56 ng/ml (ref 9–269)

## 2013-04-09 NOTE — Progress Notes (Signed)
OFFICE PROGRESS NOTE  CC  [name], MD [address] [place] [address] Dr. Vida [name]  DIAGNOSIS: 47 year old female with iron deficiency anemia  PRIOR THERAPY:  #1 status post 2 doses of feraheme in April 2012.  #2 she is now on oral iron twice a day tolerating it well.  CURRENT THERAPY ; oral iron daily  INTERVAL HISTORY: Michele [name] 47 y.o. female returns for Followup visit today. Clinically she seems to be doing well she has no complaints she denies any fevers chills night sweats headaches she has no shortness of breath. No chest pains or palpitations. She does take Benadryl once in a while to help her sleep. She occasionally experiences some constipation because of her iron. Patient does have a history of asthma she so she does have some shortness of breath off and on. She has no bleeding problems. Remainder of the tampon review of systems is negative.  MEDICAL HISTORY: Past Medical History  Diagnosis Date  . Anxiety   . Asthma   . HLD (hyperlipidemia)   . Tachycardia     s/p negative cardiac work up with echo  . IBS (irritable bowel syndrome)   . Endometriosis   . Rotator cuff tear, left 2009  . Syncope   . Iron deficiency anemia 1/12  . Hiatal hernia   . Gastric polyp   . Abnormal Pap smear of cervix     cryotx  . Uterine polyp   . Hemorrhoids     ALLERGIES:  is allergic to nsaids; povidone; and propoxyphene-acetaminophen.  MEDICATIONS:  Current Outpatient Prescriptions  Medication Sig Dispense Refill  . albuterol (PROAIR HFA) 108 (90 BASE) MCG/ACT inhaler Inhale 2 puffs into the lungs every 4 (four) hours as needed.        Marland Kitchen atorvastatin (LIPITOR) 20 MG tablet Take 1 tablet (20 mg total) by mouth daily.  30 tablet  11  . fluconazole (DIFLUCAN) 150 MG tablet Take 1 tablet (150 mg total) by mouth once.  1 tablet  0  . FLUoxetine (PROZAC) 20 MG tablet 3 tabs by mouth once daily  90 tablet  3  . fluticasone (FLONASE)  50 MCG/ACT nasal spray Place 2 sprays into the nose as needed.       . hydrocortisone (ANUSOL-HC) 2.5 % rectal cream Place 1 application rectally as needed.       . iron polysaccharides (POLY-IRON 150) 150 MG capsule       . montelukast (SINGULAIR) 10 MG tablet Take 1 tablet (10 mg total) by mouth at bedtime.  30 tablet  11  . traMADol (ULTRAM) 50 MG tablet Take 1 tablet (50 mg total) by mouth every 6 (six) hours as needed for pain.  50 tablet  2  . [DISCONTINUED] norethindrone-ethinyl estradiol-iron (MICROGESTIN FE1.5/30) 1.5-30 MG-MCG tablet Take 1 tablet by mouth daily.         No current facility-administered medications for this visit.    SURGICAL HISTORY:  Past Surgical History  Procedure Laterality Date  . Hysteroscopy    . Esophagogastroduodenoscopy  1999  . Colonoscopy  1999    small polyps, hemms  . Carpal tunnel release    . Laparoscopy  2006    endometriosis  . Esophagogastroduodenoscopy  2/12    gastric polyp and HH    REVIEW OF SYSTEMS:  Pertinent items are noted in HPI.   PHYSICAL EXAMINATION: General appearance: alert, cooperative and appears stated age Resp: clear to auscultation bilaterally Cardio:  regular rate and rhythm, S1, S2 normal, no murmur, click, rub or gallop GI: soft, non-tender; bowel sounds normal; no masses,  no organomegaly Extremities: extremities normal, atraumatic, no cyanosis or edema Neurologic: Alert and oriented X 3, normal strength and tone. Normal symmetric reflexes. Normal coordination and gait  ECOG PERFORMANCE STATUS: 0 - Asymptomatic  Blood pressure 124/78, pulse 81, temperature 98.3 F (36.8 C), temperature source Oral, resp. rate 20, height 5' 8.5" (1.74 m), weight 203 lb 6.4 oz (92.262 kg).  LABORATORY DATA: Lab Results  Component Value Date   WBC 5.6 03/28/2013   HGB 13.5 03/28/2013   HCT 39.7 03/28/2013   MCV 89.5 03/28/2013   PLT 249 03/28/2013      Chemistry      Component Value Date/Time   NA 140 03/28/2013 1044   NA  140 02/12/2013 1438   K 4.3 03/28/2013 1044   K 4.5 02/12/2013 1438   CL 105 02/12/2013 1438   CO2 27 03/28/2013 1044   CO2 27 02/12/2013 1438   BUN 8.5 03/28/2013 1044   BUN 13 02/12/2013 1438   CREATININE 0.8 03/28/2013 1044   CREATININE 0.9 02/12/2013 1438      Component Value Date/Time   CALCIUM 9.2 03/28/2013 1044   CALCIUM 9.3 02/12/2013 1438   ALKPHOS 112 02/12/2013 1438   AST 21 02/12/2013 1438   ALT 22 02/12/2013 1438   BILITOT 0.8 02/12/2013 1438       RADIOGRAPHIC STUDIES:  No results found.  ASSESSMENT: 47 year old female with  #1 iron deficiency anemia patient's total iron looks great today her H&H is also terrific. At this time she does not need parenteral iron.  #2 perimenopausal symptoms   PLAN:  #1 overall patient is doing well. She has not required any IV iron from me in quite some time.  #2 I will see her back as needed  All questions were answered. The patient knows to call the clinic with any problems, questions or concerns. We can certainly see the patient much sooner if necessary.  I spent 15 minutes counseling the patient face to face. The total time spent in the appointment was 20 minutes.    Drue Second, MD Medical/Oncology [facility] [phone] (beeper) [phone] (Office)  04/09/2013, 7:46 AM

## 2013-04-23 ENCOUNTER — Ambulatory Visit (INDEPENDENT_AMBULATORY_CARE_PROVIDER_SITE_OTHER): Payer: Commercial Managed Care - PPO | Admitting: Family Medicine

## 2013-04-23 ENCOUNTER — Encounter: Payer: Self-pay | Admitting: Family Medicine

## 2013-04-23 VITALS — BP 122/82 | HR 80 | Temp 99.1°F | Ht 68.5 in | Wt 199.5 lb

## 2013-04-23 DIAGNOSIS — L909 Atrophic disorder of skin, unspecified: Secondary | ICD-10-CM

## 2013-04-23 DIAGNOSIS — Z23 Encounter for immunization: Secondary | ICD-10-CM

## 2013-04-23 DIAGNOSIS — N3946 Mixed incontinence: Secondary | ICD-10-CM | POA: Insufficient documentation

## 2013-04-23 DIAGNOSIS — L918 Other hypertrophic disorders of the skin: Secondary | ICD-10-CM

## 2013-04-23 DIAGNOSIS — N393 Stress incontinence (female) (male): Secondary | ICD-10-CM

## 2013-04-23 NOTE — Patient Instructions (Signed)
For stress incontinence - try kegel exercises -as often as you can  For urge incontinence - try the "freeze and squeeze " technique  Also - check out the oxytrol patch and see if you want to try it- keep me updated  Flu shot today Keep skin tag on back clean and use antibiotic ointment if needed- it should fall off in the next 2 weeks

## 2013-04-23 NOTE — Progress Notes (Signed)
Subjective:    Patient ID: Michele [name], female    DOB: August 10, 1965, 47 y.o.   MRN: [id number]  HPI Here to check a skin lesion on her back - also wanted to mention urinary incontinence Started itching  She scratched it - and abraded it  ? If scratched part of it up   Having stress incontinence Wears panty liners Happened when she rolled over in bed No dysuria   Also some urge incontinence at times- that is less often  No uti symptoms at all  She has not had a hysterectomy   Patient Active Problem List   Diagnosis Date Noted  . Other malaise and fatigue 02/12/2013  . Routine gynecological examination 04/01/2012  . Hyperglycemia 04/01/2012  . Back pain 04/01/2012  . Routine general medical examination at a health care facility 03/11/2012  . Bug bite of hand 01/19/2011  . GERD 08/26/2010  . ANEMIA 07/19/2010  . FREQUENCY, URINARY 07/06/2010  . MICROSCOPIC HEMATURIA 06/09/2010  . COLONIC POLYPS, HX OF 11/15/2007  . IBS 10/08/2007  . ENDOMETRIOSIS 10/08/2007  . HYPERLIPIDEMIA 10/30/2006  . Anxiety and depression 10/30/2006  . HEMORRHOIDS, INTERNAL 10/30/2006  . ASTHMA 10/30/2006  . MIGRAINES, HX OF 10/30/2006   Past Medical History  Diagnosis Date  . Anxiety   . Asthma   . HLD (hyperlipidemia)   . Tachycardia     s/p negative cardiac work up with echo  . IBS (irritable bowel syndrome)   . Endometriosis   . Rotator cuff tear, left 2009  . Syncope   . Iron deficiency anemia 1/12  . Hiatal hernia   . Gastric polyp   . Abnormal Pap smear of cervix     cryotx  . Uterine polyp   . Hemorrhoids    Past Surgical History  Procedure Laterality Date  . Hysteroscopy    . Esophagogastroduodenoscopy  1999  . Colonoscopy  1999    small polyps, hemms  . Carpal tunnel release    . Laparoscopy  2006    endometriosis  . Esophagogastroduodenoscopy  2/12    gastric polyp and HH   History  Substance Use Topics  . Smoking status: Former Games developer  . Smokeless tobacco:  Not on file  . Alcohol Use: Yes     Comment: Occasional   Family History  Problem Relation Age of Onset  . Hypertension Father   . Diabetes Father   . Iron deficiency Father     anemia  . Other Father     Barrett's esoph  . Stroke Mother   . Migraines Mother   . Hyperlipidemia Mother   . Diabetes Sister   . Hyperlipidemia Sister    Allergies  Allergen Reactions  . Nsaids     REACTION: GI upset  . Povidone     REACTION: rash  . Propoxyphene-Acetaminophen     REACTION: itchy   Current Outpatient Prescriptions on File Prior to Visit  Medication Sig Dispense Refill  . albuterol (PROAIR HFA) 108 (90 BASE) MCG/ACT inhaler Inhale 2 puffs into the lungs every 4 (four) hours as needed.        Marland Kitchen atorvastatin (LIPITOR) 20 MG tablet Take 1 tablet (20 mg total) by mouth daily.  30 tablet  11  . FLUoxetine (PROZAC) 20 MG tablet 3 tabs by mouth once daily  90 tablet  3  . fluticasone (FLONASE) 50 MCG/ACT nasal spray Place 2 sprays into the nose as needed.       . hydrocortisone (ANUSOL-HC) 2.5 %  rectal cream Place 1 application rectally as needed.       . iron polysaccharides (POLY-IRON 150) 150 MG capsule       . montelukast (SINGULAIR) 10 MG tablet Take 1 tablet (10 mg total) by mouth at bedtime.  30 tablet  11  . [DISCONTINUED] norethindrone-ethinyl estradiol-iron (MICROGESTIN FE1.5/30) 1.5-30 MG-MCG tablet Take 1 tablet by mouth daily.         No current facility-administered medications on file prior to visit.      Review of Systems Review of Systems  Constitutional: Negative for fever, appetite change, fatigue and unexpected weight change.  Eyes: Negative for pain and visual disturbance.  Respiratory: Negative for cough and shortness of breath.   Cardiovascular: Negative for cp or palpitations    Gastrointestinal: Negative for nausea, diarrhea and constipation.  Genitourinary: Negative for urgency and frequency. neg for dysuria/hematuria or flank pain  Skin: Negative for  pallor or rash   Neurological: Negative for weakness, light-headedness, numbness and headaches.  Hematological: Negative for adenopathy. Does not bruise/bleed easily.  Psychiatric/Behavioral: Negative for dysphoric mood. The patient is not nervous/anxious.         Objective:   Physical Exam  Constitutional: She appears well-developed and well-nourished. No distress.  overwt and well app  Eyes: Conjunctivae and EOM are normal. Pupils are equal, round, and reactive to light.  Cardiovascular: Normal rate and regular rhythm.   Pulmonary/Chest: Effort normal and breath sounds normal.  Abdominal: Soft. Bowel sounds are normal. She exhibits no distension and no mass. There is no tenderness.  No suprapubic tenderness or fullness  No cva tenderness   Musculoskeletal: She exhibits no edema.  Neurological: She is alert. She has normal reflexes.  Skin: Skin is warm and dry. There is erythema.  L mid back - 2-3 mm irritated and abraded skin tag This was cleaned and then tx with cryotx (liquid nitrogen) Complete freeze and thaw Dressed with band aid Pt tolerated well    Psychiatric: She has a normal mood and affect.          Assessment & Plan:

## 2013-04-24 NOTE — Assessment & Plan Note (Signed)
Disc kegel exericses/ and poss PT for stress incont Disc "freeze and squeeze" tech for urge incont and avail of oxytrol patch otc  She will f/u if more bothersome to begin w/u  Handout given

## 2013-04-24 NOTE — Assessment & Plan Note (Signed)
Treated with cryotherapy Disc expectations and after care If this does not come off in 1-2 weeks will call

## 2013-06-03 ENCOUNTER — Encounter: Payer: Self-pay | Admitting: Family Medicine

## 2013-06-03 ENCOUNTER — Ambulatory Visit (INDEPENDENT_AMBULATORY_CARE_PROVIDER_SITE_OTHER): Payer: Commercial Managed Care - PPO | Admitting: Family Medicine

## 2013-06-03 VITALS — BP 106/70 | HR 72 | Temp 98.2°F | Ht 68.5 in | Wt 203.0 lb

## 2013-06-03 DIAGNOSIS — M545 Low back pain, unspecified: Secondary | ICD-10-CM

## 2013-06-03 DIAGNOSIS — M549 Dorsalgia, unspecified: Secondary | ICD-10-CM

## 2013-06-03 LAB — POCT URINALYSIS DIPSTICK
Bilirubin, UA: NEGATIVE
Nitrite, UA: NEGATIVE
Protein, UA: NEGATIVE
pH, UA: 7

## 2013-06-03 MED ORDER — CYCLOBENZAPRINE HCL 10 MG PO TABS: 10.0000 mg | ORAL_TABLET | Freq: Three times a day (TID) | ORAL | Status: DC | PRN

## 2013-06-03 NOTE — Progress Notes (Signed)
Subjective:    Patient ID: Michele [name], female    DOB: 02-03-1966, 47 y.o.   MRN: [id number]  HPI Low back pain- worse for the past 2 weeks (used to be more menstrual and now is constant) Really bothering her  Better if standing walking and worse sitting/driving  Lying down on stomach gives her some relief (other positions are not comfortable) Is sharp and stabbing R and center of low back and at times feels like pressure   Hurts to flex or rotate L hip  No lost strength  No bowel or bladder  Changes No numbness   Very stiff and sore after inactivity  Aleve - otc helps a bit   Patient Active Problem List   Diagnosis Date Noted  . Skin tag 04/23/2013  . Stress incontinence in female 04/23/2013  . Other malaise and fatigue 02/12/2013  . Routine gynecological examination 04/01/2012  . Hyperglycemia 04/01/2012  . Back pain 04/01/2012  . Routine general medical examination at a health care facility 03/11/2012  . Bug bite of hand 01/19/2011  . GERD 08/26/2010  . ANEMIA 07/19/2010  . FREQUENCY, URINARY 07/06/2010  . MICROSCOPIC HEMATURIA 06/09/2010  . COLONIC POLYPS, HX OF 11/15/2007  . IBS 10/08/2007  . ENDOMETRIOSIS 10/08/2007  . HYPERLIPIDEMIA 10/30/2006  . Anxiety and depression 10/30/2006  . HEMORRHOIDS, INTERNAL 10/30/2006  . ASTHMA 10/30/2006  . MIGRAINES, HX OF 10/30/2006   Past Medical History  Diagnosis Date  . Anxiety   . Asthma   . HLD (hyperlipidemia)   . Tachycardia     s/p negative cardiac work up with echo  . IBS (irritable bowel syndrome)   . Endometriosis   . Rotator cuff tear, left 2009  . Syncope   . Iron deficiency anemia 1/12  . Hiatal hernia   . Gastric polyp   . Abnormal Pap smear of cervix     cryotx  . Uterine polyp   . Hemorrhoids    Past Surgical History  Procedure Laterality Date  . Hysteroscopy    . Esophagogastroduodenoscopy  1999  . Colonoscopy  1999    small polyps, hemms  . Carpal tunnel release    . Laparoscopy  2006     endometriosis  . Esophagogastroduodenoscopy  2/12    gastric polyp and HH   History  Substance Use Topics  . Smoking status: Former Games developer  . Smokeless tobacco: Not on file  . Alcohol Use: Yes     Comment: Occasional   Family History  Problem Relation Age of Onset  . Hypertension Father   . Diabetes Father   . Iron deficiency Father     anemia  . Other Father     Barrett's esoph  . Stroke Mother   . Migraines Mother   . Hyperlipidemia Mother   . Diabetes Sister   . Hyperlipidemia Sister    Allergies  Allergen Reactions  . Nsaids     REACTION: GI upset  . Povidone     REACTION: rash  . Propoxyphene-Acetaminophen     REACTION: itchy   Current Outpatient Prescriptions on File Prior to Visit  Medication Sig Dispense Refill  . albuterol (PROAIR HFA) 108 (90 BASE) MCG/ACT inhaler Inhale 2 puffs into the lungs every 4 (four) hours as needed.        Marland Kitchen atorvastatin (LIPITOR) 20 MG tablet Take 1 tablet (20 mg total) by mouth daily.  30 tablet  11  . FLUoxetine (PROZAC) 20 MG tablet 3 tabs by mouth  once daily  90 tablet  3  . fluticasone (FLONASE) 50 MCG/ACT nasal spray Place 2 sprays into the nose as needed.       . hydrocortisone (ANUSOL-HC) 2.5 % rectal cream Place 1 application rectally as needed.       . iron polysaccharides (POLY-IRON 150) 150 MG capsule Take 150 mg by mouth daily.       . montelukast (SINGULAIR) 10 MG tablet Take 1 tablet (10 mg total) by mouth at bedtime.  30 tablet  11  . [DISCONTINUED] norethindrone-ethinyl estradiol-iron (MICROGESTIN FE1.5/30) 1.5-30 MG-MCG tablet Take 1 tablet by mouth daily.         No current facility-administered medications on file prior to visit.     Review of Systems Review of Systems  Constitutional: Negative for fever, appetite change, fatigue and unexpected weight change.  Eyes: Negative for pain and visual disturbance.  Respiratory: Negative for cough and shortness of breath.   Cardiovascular: Negative for cp or  palpitations    Gastrointestinal: Negative for nausea, diarrhea and constipation.  Genitourinary: Negative for urgency and frequency.  Skin: Negative for pallor or rash   MSK pos for low back pain/ neg for joint swelling  Neurological: Negative for weakness, light-headedness, numbness and headaches.  Hematological: Negative for adenopathy. Does not bruise/bleed easily.  Psychiatric/Behavioral: Negative for dysphoric mood. The patient is not nervous/anxious.         Objective:   Physical Exam  Constitutional: She appears well-developed and well-nourished. No distress.  obese and well appearing   HENT:  Head: Normocephalic and atraumatic.  Eyes: Conjunctivae and EOM are normal. Pupils are equal, round, and reactive to light.  Neck: Normal range of motion. Neck supple.  Cardiovascular: Normal rate and regular rhythm.   Abdominal: Soft. Bowel sounds are normal.  Musculoskeletal:       Lumbar back: She exhibits decreased range of motion, tenderness, bony tenderness and spasm. She exhibits no edema.  Spasm in R para lumbar musculature  Neg SLR for leg pain Nl rot of hips   No neuro s/s  LS flex 30 deg with pain Ext 10 deg  Pain with L lat flex     Lymphadenopathy:    She has no cervical adenopathy.  Neurological: She is alert. She has normal strength and normal reflexes. She displays no atrophy. No sensory deficit.  Skin: Skin is warm and dry. No rash noted.  Psychiatric: She has a normal mood and affect.          Assessment & Plan:

## 2013-06-03 NOTE — Progress Notes (Signed)
Pre-visit discussion using our clinic review tool. No additional management support is needed unless otherwise documented below in the visit note.  

## 2013-06-03 NOTE — Assessment & Plan Note (Addendum)
Recurrent low back pain - worse for 2 weeks  And worse with LS flex No neuro s/s ua neg  Aleve - prn with food as tol Flexeril - prn warned of sedation Stretching/ heat  Handout given on back pain  If not imp in 3-4 d consider films / PT

## 2013-06-03 NOTE — Patient Instructions (Signed)
Take aleve up to 2 pills with food twice daily if it does not bother your stomach Take flexeril (muscle relaxer)-when not working or driving Try heat to back 10 minutes at a time Walking is the best activity  If not improved in 3-4 days let me know Back Pain, Adult Low back pain is very common. About 1 in 5 people have back pain.The cause of low back pain is rarely dangerous. The pain often gets better over time.About half of people with a sudden onset of back pain feel better in just 2 weeks. About 8 in 10 people feel better by 6 weeks.  CAUSES Some common causes of back pain include:  Strain of the muscles or ligaments supporting the spine.  Wear and tear (degeneration) of the spinal discs.  Arthritis.  Direct injury to the back. DIAGNOSIS Most of the time, the direct cause of low back pain is not known.However, back pain can be treated effectively even when the exact cause of the pain is unknown.Answering your caregiver's questions about your overall health and symptoms is one of the most accurate ways to make sure the cause of your pain is not dangerous. If your caregiver needs more information, he or she may order lab work or imaging tests (X-rays or MRIs).However, even if imaging tests show changes in your back, this usually does not require surgery. HOME CARE INSTRUCTIONS For many people, back pain returns.Since low back pain is rarely dangerous, it is often a condition that people can learn to [facility] their own.   Remain active. It is stressful on the back to sit or stand in one place. Do not sit, drive, or stand in one place for more than 30 minutes at a time. Take short walks on level surfaces as soon as pain allows.Try to increase the length of time you walk each day.  Do not stay in bed.Resting more than 1 or 2 days can delay your recovery.  Do not avoid exercise or work.Your body is made to move.It is not dangerous to be active, even though your back may hurt.Your  back will likely heal faster if you return to being active before your pain is gone.  Pay attention to your body when you bend and lift. Many people have less discomfortwhen lifting if they bend their knees, keep the load close to their bodies,and avoid twisting. Often, the most comfortable positions are those that put less stress on your recovering back.  Find a comfortable position to sleep. Use a firm mattress and lie on your side with your knees slightly bent. If you lie on your back, put a pillow under your knees.  Only take over-the-counter or prescription medicines as directed by your caregiver. Over-the-counter medicines to reduce pain and inflammation are often the most helpful.Your caregiver may prescribe muscle relaxant drugs.These medicines help dull your pain so you can more quickly return to your normal activities and healthy exercise.  Put ice on the injured area.  Put ice in a plastic bag.  Place a towel between your skin and the bag.  Leave the ice on for 15-20 minutes, 03-04 times a day for the first 2 to 3 days. After that, ice and heat may be alternated to reduce pain and spasms.  Ask your caregiver about trying back exercises and gentle massage. This may be of some benefit.  Avoid feeling anxious or stressed.Stress increases muscle tension and can worsen back pain.It is important to recognize when you are anxious or stressed and  learn ways to manage it.Exercise is a great option. SEEK MEDICAL CARE IF:  You have pain that is not relieved with rest or medicine.  You have pain that does not improve in 1 week.  You have new symptoms.  You are generally not feeling well. SEEK IMMEDIATE MEDICAL CARE IF:   You have pain that radiates from your back into your legs.  You develop new bowel or bladder control problems.  You have unusual weakness or numbness in your arms or legs.  You develop nausea or vomiting.  You develop abdominal pain.  You feel  faint. Document Released: 06/19/2005 Document Revised: 12/19/2011 Document Reviewed: 11/07/2010 [facility] Patient Information 2014 [place], [place].

## 2013-06-10 ENCOUNTER — Encounter: Payer: Self-pay | Admitting: Family Medicine

## 2013-06-10 ENCOUNTER — Ambulatory Visit (INDEPENDENT_AMBULATORY_CARE_PROVIDER_SITE_OTHER): Payer: Commercial Managed Care - PPO | Admitting: Family Medicine

## 2013-06-10 ENCOUNTER — Ambulatory Visit (INDEPENDENT_AMBULATORY_CARE_PROVIDER_SITE_OTHER)
Admission: RE | Admit: 2013-06-10 | Discharge: 2013-06-10 | Disposition: A | Discharge status: Home / Self Care | Payer: Commercial Managed Care - PPO | Source: Ambulatory Visit | Attending: Family Medicine | Admitting: Family Medicine

## 2013-06-10 VITALS — BP 122/78 | HR 89 | Temp 98.9°F | Ht 68.5 in | Wt 206.8 lb

## 2013-06-10 DIAGNOSIS — M549 Dorsalgia, unspecified: Secondary | ICD-10-CM

## 2013-06-10 NOTE — Progress Notes (Signed)
Pre-visit discussion using our clinic review tool. No additional management support is needed unless otherwise documented below in the visit note.  

## 2013-06-10 NOTE — Patient Instructions (Signed)
Xray now Continue heat and other medicines  We will call with result and plan

## 2013-06-10 NOTE — Progress Notes (Signed)
Subjective:    Patient ID: Michele [name], female    DOB: 1966-02-09, 47 y.o.   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 Skin tag 04/23/2013  . Stress incontinence in female 04/23/2013  . Other malaise and fatigue 02/12/2013  . Routine gynecological examination 04/01/2012  . Hyperglycemia 04/01/2012  . Back pain 04/01/2012  . Routine general medical examination at a health care facility 03/11/2012  . Bug bite of hand 01/19/2011  . GERD 08/26/2010  . ANEMIA 07/19/2010  . FREQUENCY, URINARY 07/06/2010  . MICROSCOPIC HEMATURIA 06/09/2010  . COLONIC POLYPS, HX OF 11/15/2007  . IBS 10/08/2007  . ENDOMETRIOSIS 10/08/2007  . HYPERLIPIDEMIA 10/30/2006  . Anxiety and depression 10/30/2006  . HEMORRHOIDS, INTERNAL 10/30/2006  . ASTHMA 10/30/2006  . MIGRAINES, HX OF 10/30/2006   Past Medical History  Diagnosis Date  . Anxiety   . Asthma   . HLD (hyperlipidemia)   . Tachycardia     s/p negative cardiac work up with echo  . IBS (irritable bowel syndrome)   . Endometriosis   . Rotator cuff tear, left 2009  . Syncope   . Iron deficiency anemia 1/12  . Hiatal hernia   . Gastric polyp   . Abnormal Pap smear of cervix     cryotx  . Uterine polyp   . Hemorrhoids    Past Surgical History  Procedure Laterality Date  . Hysteroscopy    . Esophagogastroduodenoscopy  1999  . Colonoscopy  1999    small polyps, hemms  . Carpal tunnel release    . Laparoscopy  2006    endometriosis  . Esophagogastroduodenoscopy  2/12    gastric polyp and HH   History  Substance Use Topics  . Smoking status: Former Games developer  . Smokeless  tobacco: Not on file  . Alcohol Use: Yes     Comment: Occasional   Family History  Problem Relation Age of Onset  . Hypertension Father   . Diabetes Father   . Iron deficiency Father     anemia  . Other Father     Barrett's esoph  . Stroke Mother   . Migraines Mother   . Hyperlipidemia Mother   . Diabetes Sister   . Hyperlipidemia Sister    Allergies  Allergen Reactions  . Nsaids     REACTION: GI upset  . Povidone     REACTION: rash  . Propoxyphene-Acetaminophen     REACTION: itchy   Current Outpatient Prescriptions on File Prior to Visit  Medication Sig Dispense Refill  . albuterol (PROAIR HFA) 108 (90 BASE) MCG/ACT inhaler Inhale 2 puffs into the lungs every 4 (four) hours as needed.        Marland Kitchen atorvastatin (LIPITOR) 20 MG tablet Take 1 tablet (20 mg total) by mouth daily.  30 tablet  11  . cyclobenzaprine (FLEXERIL) 10 MG tablet Take 1 tablet (10 mg total) by mouth 3 (three) times daily as needed for muscle spasms (when not working or driving since it can sedate).  30 tablet  0  .  FLUoxetine (PROZAC) 20 MG tablet 3 tabs by mouth once daily  90 tablet  3  . fluticasone (FLONASE) 50 MCG/ACT nasal spray Place 2 sprays into the nose as needed.       . hydrocortisone (ANUSOL-HC) 2.5 % rectal cream Place 1 application rectally as needed.       . iron polysaccharides (POLY-IRON 150) 150 MG capsule Take 150 mg by mouth daily.       . montelukast (SINGULAIR) 10 MG tablet Take 1 tablet (10 mg total) by mouth at bedtime.  30 tablet  11  . [DISCONTINUED] norethindrone-ethinyl estradiol-iron (MICROGESTIN FE1.5/30) 1.5-30 MG-MCG tablet Take 1 tablet by mouth daily.         No current facility-administered medications on file prior to visit.      Review of Systems Review of Systems  Constitutional: Negative for fever, appetite change, fatigue and unexpected weight change.  Eyes: Negative for pain and visual disturbance.  Respiratory: Negative for cough and shortness of breath.     Cardiovascular: Negative for cp or palpitations    Gastrointestinal: Negative for nausea, diarrhea and constipation.  Genitourinary: Negative for urgency and frequency.  Skin: Negative for pallor or rash  MSK pos for back and leg pain   Neurological: Negative for weakness, light-headedness, numbness and headaches.  Hematological: Negative for adenopathy. Does not bruise/bleed easily.  Psychiatric/Behavioral: Negative for dysphoric mood. The patient is not nervous/anxious.         Objective:   Physical Exam  Constitutional: She appears well-developed and well-nourished. No distress.  HENT:  Head: Normocephalic and atraumatic.  Mouth/Throat: Oropharynx is clear and moist.  Eyes: Conjunctivae and EOM are normal. Pupils are equal, round, and reactive to light.  Neck: Normal range of motion. Neck supple.  Cardiovascular: Normal rate and regular rhythm.   Pulmonary/Chest: Effort normal and breath sounds normal.  Musculoskeletal: She exhibits tenderness. She exhibits no edema.  Tender over L3-L5 as well as R para lumbar musculature Pos bent knee raise on R  No neurol acute changes Gait is labored due to pain but not weakness  Lymphadenopathy:    She has no cervical adenopathy.  Neurological: She is alert. She has normal reflexes. No cranial nerve deficit. She exhibits normal muscle tone. Coordination normal.  Skin: Skin is warm and dry. No rash noted.  Psychiatric: She has a normal mood and affect.          Assessment & Plan:

## 2013-06-11 NOTE — Assessment & Plan Note (Signed)
Worse with more features of radiculopathy On nsaid and flexeril  Xray today- and consider ortho consult or PT

## 2013-06-12 ENCOUNTER — Telehealth: Payer: Self-pay | Admitting: Family Medicine

## 2013-06-12 DIAGNOSIS — M545 Low back pain, unspecified: Secondary | ICD-10-CM

## 2013-06-12 DIAGNOSIS — Z87898 Personal history of other specified conditions: Secondary | ICD-10-CM

## 2013-06-12 NOTE — Telephone Encounter (Signed)
Pt looked at xray results on mychart and she does agree with a referral to ortho, I advise pt Michele [name]/Michele [name] will call to schedule appt, please put referral in

## 2013-06-14 ENCOUNTER — Other Ambulatory Visit: Payer: Self-pay | Admitting: Family Medicine

## 2013-07-21 ENCOUNTER — Other Ambulatory Visit: Payer: Self-pay | Admitting: Family Medicine

## 2013-07-21 NOTE — Telephone Encounter (Signed)
Please refill for 6 mo 

## 2013-07-21 NOTE — Telephone Encounter (Signed)
Electronic refill request, please advise  

## 2013-08-04 ENCOUNTER — Other Ambulatory Visit: Payer: Self-pay | Admitting: Physical Medicine and Rehabilitation

## 2013-08-04 DIAGNOSIS — M545 Low back pain, unspecified: Secondary | ICD-10-CM

## 2013-08-04 DIAGNOSIS — M79605 Pain in left leg: Secondary | ICD-10-CM

## 2013-08-04 DIAGNOSIS — R32 Unspecified urinary incontinence: Secondary | ICD-10-CM

## 2013-08-06 ENCOUNTER — Ambulatory Visit
Admission: RE | Admit: 2013-08-06 | Discharge: 2013-08-06 | Disposition: A | Discharge status: Home / Self Care | Payer: No Typology Code available for payment source | Source: Ambulatory Visit | Attending: Physical Medicine and Rehabilitation | Admitting: Physical Medicine and Rehabilitation

## 2013-08-06 ENCOUNTER — Other Ambulatory Visit: Payer: Self-pay | Admitting: Physical Medicine and Rehabilitation

## 2013-08-06 VITALS — BP 105/50 | HR 66

## 2013-08-06 DIAGNOSIS — M545 Low back pain, unspecified: Secondary | ICD-10-CM

## 2013-08-06 DIAGNOSIS — M79605 Pain in left leg: Secondary | ICD-10-CM

## 2013-08-06 DIAGNOSIS — R32 Unspecified urinary incontinence: Secondary | ICD-10-CM

## 2013-08-06 MED ORDER — IOHEXOL 180 MG/ML  SOLN
18.0000 mL | Freq: Once | INTRAMUSCULAR | Status: AC | PRN
  Administered 2013-08-06: 18 mL via INTRATHECAL

## 2013-08-06 MED ORDER — DIAZEPAM 5 MG PO TABS
10.0000 mg | ORAL_TABLET | Freq: Once | ORAL | Status: AC
  Administered 2013-08-06: 10 mg via ORAL

## 2013-08-06 NOTE — Progress Notes (Signed)
Patient states she has been off Prozac for the past two days.   

## 2013-08-06 NOTE — Discharge Instructions (Signed)
Myelogram Discharge Instructions  1. Go home and rest quietly for the next 24 hours.  It is important to lie flat for the next 24 hours.  Get up only to go to the restroom.  You may lie in the bed or on a couch on your back, your stomach, your left side or your right side.  You may have one pillow under your head.  You may have pillows between your knees while you are on your side or under your knees while you are on your back.  2. DO NOT drive today.  Recline the seat as far back as it will go, while still wearing your seat belt, on the way home.  3. You may get up to go to the bathroom as needed.  You may sit up for 10 minutes to eat.  You may resume your normal diet and medications unless otherwise indicated.  Drink lots of extra fluids today and tomorrow.  4. The incidence of headache, nausea, or vomiting is about 5% (one in 20 patients).  If you develop a headache, lie flat and drink plenty of fluids until the headache goes away.  Caffeinated beverages may be helpful.  If you develop severe nausea and vomiting or a headache that does not go away with flat bed rest, call [phone].  5. You may resume normal activities after your 24 hours of bed rest is over; however, do not exert yourself strongly or do any heavy lifting tomorrow. If when you get up you have a headache when standing, go back to bed and force fluids for another 24 hours.  6. Call your physician for a follow-up appointment.  The results of your myelogram will be sent directly to your physician by the following day.  7. If you have any questions or if complications develop after you arrive home, please call [phone].  Discharge instructions have been explained to the patient.  The patient, or the person responsible for the patient, fully understands these instructions.      May resume Prozac on Feb. 5, 2015, after 8:30 am

## 2013-08-18 ENCOUNTER — Ambulatory Visit: Payer: Commercial Managed Care - PPO | Admitting: Family Medicine

## 2013-10-01 ENCOUNTER — Other Ambulatory Visit (HOSPITAL_COMMUNITY)
Admission: RE | Admit: 2013-10-01 | Discharge: 2013-10-01 | Disposition: A | Discharge status: Home / Self Care | Payer: Commercial Managed Care - PPO | Source: Ambulatory Visit | Attending: Family Medicine | Admitting: Family Medicine

## 2013-10-01 ENCOUNTER — Ambulatory Visit (INDEPENDENT_AMBULATORY_CARE_PROVIDER_SITE_OTHER): Payer: Commercial Managed Care - PPO | Admitting: Family Medicine

## 2013-10-01 ENCOUNTER — Encounter: Payer: Self-pay | Admitting: Family Medicine

## 2013-10-01 VITALS — BP 124/76 | HR 84 | Temp 97.8°F | Ht 68.5 in | Wt 207.5 lb

## 2013-10-01 DIAGNOSIS — Z01419 Encounter for gynecological examination (general) (routine) without abnormal findings: Secondary | ICD-10-CM

## 2013-10-01 DIAGNOSIS — IMO0002 Reserved for concepts with insufficient information to code with codable children: Secondary | ICD-10-CM

## 2013-10-01 DIAGNOSIS — N939 Abnormal uterine and vaginal bleeding, unspecified: Secondary | ICD-10-CM

## 2013-10-01 DIAGNOSIS — N898 Other specified noninflammatory disorders of vagina: Secondary | ICD-10-CM

## 2013-10-01 DIAGNOSIS — R198 Other specified symptoms and signs involving the digestive system and abdomen: Secondary | ICD-10-CM

## 2013-10-01 DIAGNOSIS — N816 Rectocele: Secondary | ICD-10-CM

## 2013-10-01 NOTE — Patient Instructions (Signed)
Please stop up front for a referral to gyn  If symptoms worsen in the meantime please let me know

## 2013-10-01 NOTE — Progress Notes (Signed)
Pre visit review using our clinic review tool, if applicable. No additional management support is needed unless otherwise documented below in the visit note. 

## 2013-10-01 NOTE — Progress Notes (Signed)
Subjective:    Patient ID: Michele [name], female    DOB: 05-21-1966, 48 y.o.   MRN: [id number]  HPI Here with rectal complaints  Having pressure and problems  Wonders about a rectocele  She now has to push with her hand to have a bowel movement Hard time getting clean after bm- ? Wonders about fecal incontinence More gas lately  Not blood lately   She also has occ urine incontinence  She does have vaginal spotting with intercourse  Not having regular period - in the past year    Hx of internal hemorrhoids   2/12 colon polyps -2 with dr Watt Climes   Her back continues to hurt-she did get an injection and that helped temporarily  Patient Active Problem List   Diagnosis Date Noted  . Rectal pressure 10/01/2013  . Rectocele 10/01/2013  . Painful intercourse 10/01/2013  . Abnormal vaginal bleeding 10/01/2013  . Skin tag 04/23/2013  . Stress incontinence in female 04/23/2013  . Other malaise and fatigue 02/12/2013  . Routine gynecological examination 04/01/2012  . Hyperglycemia 04/01/2012  . Low back pain 04/01/2012  . Routine general medical examination at a health care facility 03/11/2012  . Bug bite of hand 01/19/2011  . GERD 08/26/2010  . ANEMIA 07/19/2010  . FREQUENCY, URINARY 07/06/2010  . MICROSCOPIC HEMATURIA 06/09/2010  . COLONIC POLYPS, HX OF 11/15/2007  . IBS 10/08/2007  . ENDOMETRIOSIS 10/08/2007  . HYPERLIPIDEMIA 10/30/2006  . Anxiety and depression 10/30/2006  . HEMORRHOIDS, INTERNAL 10/30/2006  . ASTHMA 10/30/2006  . MIGRAINES, HX OF 10/30/2006   Past Medical History  Diagnosis Date  . Anxiety   . Asthma   . HLD (hyperlipidemia)   . Tachycardia     s/p negative cardiac work up with echo  . IBS (irritable bowel syndrome)   . Endometriosis   . Rotator cuff tear, left 2009  . Syncope   . Iron deficiency anemia 1/12  . Hiatal hernia   . Gastric polyp   . Abnormal Pap smear of cervix     cryotx  . Uterine polyp   . Hemorrhoids    Past Surgical  History  Procedure Laterality Date  . Hysteroscopy    . Esophagogastroduodenoscopy  1999  . Colonoscopy  1999    small polyps, hemms  . Carpal tunnel release    . Laparoscopy  2006    endometriosis  . Esophagogastroduodenoscopy  2/12    gastric polyp and HH   History  Substance Use Topics  . Smoking status: Former Research scientist (life sciences)  . Smokeless tobacco: Not on file  . Alcohol Use: Yes     Comment: Occasional   Family History  Problem Relation Age of Onset  . Hypertension Father   . Diabetes Father   . Iron deficiency Father     anemia  . Other Father     Barrett's esoph  . Stroke Mother   . Migraines Mother   . Hyperlipidemia Mother   . Diabetes Sister   . Hyperlipidemia Sister    Allergies  Allergen Reactions  . Nsaids Nausea And Vomiting       . Povidone-Iodine Rash       . Propoxyphene N-Acetaminophen Itching        Current Outpatient Prescriptions on File Prior to Visit  Medication Sig Dispense Refill  . albuterol (PROAIR HFA) 108 (90 BASE) MCG/ACT inhaler Inhale 2 puffs into the lungs every 4 (four) hours as needed.        Marland Kitchen atorvastatin (  LIPITOR) 20 MG tablet Take 1 tablet (20 mg total) by mouth daily.  30 tablet  11  . FLUoxetine (PROZAC) 20 MG tablet 3 tabs by mouth once daily  90 tablet  3  . FLUoxetine (PROZAC) 40 MG capsule Take one capsule by mouth one time daily  30 capsule  5  . fluticasone (FLONASE) 50 MCG/ACT nasal spray Place 2 sprays into the nose as needed.       . hydrocortisone (ANUSOL-HC) 2.5 % rectal cream Place 1 application rectally as needed.       . montelukast (SINGULAIR) 10 MG tablet Take 1 tablet (10 mg total) by mouth at bedtime.  30 tablet  11  . POLY-IRON 150 150 MG capsule Take one capsule by mouth three times daily  90 capsule  4  . [DISCONTINUED] norethindrone-ethinyl estradiol-iron (MICROGESTIN FE1.5/30) 1.5-30 MG-MCG tablet Take 1 tablet by mouth daily.         No current facility-administered medications on file prior to visit.       Review of Systems Review of Systems  Constitutional: Negative for fever, appetite change, fatigue and unexpected weight change.  Eyes: Negative for pain and visual disturbance.  Respiratory: Negative for cough and shortness of breath.   Cardiovascular: Negative for cp or palpitations    Gastrointestinal: Negative for nausea, diarrhea and constipation. pos for gas and difficulty having bms  Genitourinary: Negative for urgency and frequency. neg for vaginal discharge or pain  Skin: Negative for pallor or rash   Neurological: Negative for weakness, light-headedness, numbness and headaches.  Hematological: Negative for adenopathy. Does not bruise/bleed easily.  Psychiatric/Behavioral: Negative for dysphoric mood. The patient is not nervous/anxious.         Objective:   Physical Exam  Constitutional: She appears well-developed and well-nourished. No distress.  HENT:  Head: Normocephalic and atraumatic.  Eyes: Conjunctivae and EOM are normal. Pupils are equal, round, and reactive to light.  Neck: Normal range of motion. Neck supple. No thyromegaly present.  Cardiovascular: Normal rate and regular rhythm.   Pulmonary/Chest: Effort normal and breath sounds normal.  Abdominal: Soft. Bowel sounds are normal. She exhibits no distension and no mass. There is no tenderness. There is no rebound and no guarding.  No suprapubic tenderness or fullness    Genitourinary: Rectum normal. Rectal exam shows no external hemorrhoid, no fissure and anal tone normal. Guaiac negative stool. There is no rash, tenderness or lesion on the right labia. There is no rash, tenderness or lesion on the left labia. Uterus is not enlarged and not tender. Cervix exhibits no motion tenderness, no discharge and no friability. Right adnexum displays no mass, no tenderness and no fullness. Left adnexum displays no mass, no tenderness and no fullness. No tenderness or bleeding around the vagina. No signs of injury around the  vagina. No vaginal discharge found.  Nl anal tone   Small rectocele noted   No masses or tenderness   Musculoskeletal: She exhibits no edema.  Lymphadenopathy:    She has no cervical adenopathy.  Neurological: She is alert. She has normal reflexes.  Skin: Skin is warm and dry. No rash noted. No erythema.  Psychiatric: She has a normal mood and affect.          Assessment & Plan:

## 2013-10-02 NOTE — Assessment & Plan Note (Signed)
Problematic for pt  Ref to gyn for eval

## 2013-10-02 NOTE — Assessment & Plan Note (Signed)
Pap and exam done  Ref to gyn for ? Rectocele and also some bleeding with intercourse

## 2013-10-02 NOTE — Assessment & Plan Note (Signed)
Pt has some discomfort (may be atrophy) and bleeding with intercourse  Will ref to gyn

## 2013-10-02 NOTE — Assessment & Plan Note (Signed)
During intercourse Exam done  Ref to gyn

## 2013-10-08 ENCOUNTER — Ambulatory Visit (INDEPENDENT_AMBULATORY_CARE_PROVIDER_SITE_OTHER): Payer: Commercial Managed Care - PPO | Admitting: Obstetrics & Gynecology

## 2013-10-08 ENCOUNTER — Encounter: Payer: Self-pay | Admitting: Obstetrics & Gynecology

## 2013-10-08 VITALS — BP 146/97 | HR 85 | Ht 68.0 in | Wt 207.0 lb

## 2013-10-08 DIAGNOSIS — IMO0002 Reserved for concepts with insufficient information to code with codable children: Secondary | ICD-10-CM

## 2013-10-08 DIAGNOSIS — N816 Rectocele: Secondary | ICD-10-CM

## 2013-10-08 DIAGNOSIS — R198 Other specified symptoms and signs involving the digestive system and abdomen: Secondary | ICD-10-CM

## 2013-10-08 DIAGNOSIS — N939 Abnormal uterine and vaginal bleeding, unspecified: Secondary | ICD-10-CM

## 2013-10-08 DIAGNOSIS — N898 Other specified noninflammatory disorders of vagina: Secondary | ICD-10-CM

## 2013-10-08 NOTE — Patient Instructions (Signed)

## 2013-10-08 NOTE — Progress Notes (Signed)
   CLINIC ENCOUNTER NOTE  History:  48 y.o. G1P1001 here today for evaluation of pelvic organ prolapse (POP), urinary incontinence (UI) and bleeding after intercourse/irregular spotting. Referred from Dr. Loura Pardon.    POP and UI symptoms started after SVD of her only child in 1995, she had a midline episiotomy during that delivery.  The problem has gradually worsened to the point that she has UI not only after sneezing, coughing, Valsalva,  but sometimes when she is lying in bed. Has not been evaluated by any other provider. She also had problems with fecal incontinence occasionally and having to push down on her rectum through her vagina to complete her bowel movement.  Always feels incompletely evacuated unless she performs this splinting maneuver.  Laxatives sometimes help, but she still has to do the splinting.   Reports irregular spotting that mainly happens after intercourse but sometimes sporadically. Cannot remember when she had a normal LMP.  Has testing a few years ago that showed her to be perimenopausal and she had vasomotor symptoms; no symptoms currently.  Has to wear a light pad at all times due to all these complaints: UI, fecal control dysfunction and spotting.  Interferes with quality of life.  The following portions of the patient's history were reviewed and updated as appropriate: allergies, current medications, past family history, past medical history, past social history, past surgical history and problem list. Normal pap smear on 10/01/13; normal mammogram in 11/01/2011.  Review of Systems:  Pertinent items are noted in HPI.  Objective:  Physical Exam BP 146/97  Pulse 85  Ht 5\' 8"  (1.727 m)  Wt 207 lb (93.895 kg)  BMI 31.48 kg/m2  LMP 10/08/2013 Gen: NAD Abd: Soft, nontender and nondistended Pelvic: Normal appearing external genitalia; Grade I cystocele and Grade II rectocele noted on Valsalva. No UI on Valsalva.  Normal appearing vaginal mucosa and cervix, no ectropion  noted.  Normal discharge.  Small retroverted uterus, no other palpable masses, no uterine or adnexal tenderness   Assessment & Plan:  Pelvic ultrasound ordered; Leon also checked. Will follow up results and manage accordingly. Return to be evaluated by Dr. Clovia Cuff for her POP and UI, and to discuss further evaluation and management.   Verita Schneiders, MD, [place] Attending [place], [place]

## 2013-10-09 LAB — FOLLICLE STIMULATING HORMONE: FSH: 58.7 m[IU]/mL

## 2013-10-15 ENCOUNTER — Other Ambulatory Visit: Payer: Self-pay | Admitting: *Deleted

## 2013-10-15 MED ORDER — ESOMEPRAZOLE MAGNESIUM 40 MG PO CPDR: DELAYED_RELEASE_CAPSULE | ORAL | Status: DC

## 2013-10-15 MED ORDER — ATORVASTATIN CALCIUM 20 MG PO TABS: 20.0000 mg | ORAL_TABLET | Freq: Every day | ORAL | Status: DC

## 2013-10-15 MED ORDER — FLUOXETINE HCL 40 MG PO CAPS: ORAL_CAPSULE | ORAL | Status: DC

## 2013-10-15 MED ORDER — MONTELUKAST SODIUM 10 MG PO TABS: 10.0000 mg | ORAL_TABLET | Freq: Every day | ORAL | Status: DC

## 2013-10-15 NOTE — Addendum Note (Signed)
Addended by: Daralene Milch C on: 10/15/2013 11:30 AM   Modules accepted: Orders

## 2013-10-17 ENCOUNTER — Ambulatory Visit (HOSPITAL_COMMUNITY)
Admission: RE | Admit: 2013-10-17 | Discharge: 2013-10-17 | Disposition: A | Discharge status: Home / Self Care | Payer: Commercial Managed Care - PPO | Source: Ambulatory Visit | Attending: Obstetrics & Gynecology | Admitting: Obstetrics & Gynecology

## 2013-10-17 ENCOUNTER — Encounter (HOSPITAL_COMMUNITY): Payer: Self-pay

## 2013-10-17 DIAGNOSIS — N939 Abnormal uterine and vaginal bleeding, unspecified: Secondary | ICD-10-CM

## 2013-10-17 DIAGNOSIS — N949 Unspecified condition associated with female genital organs and menstrual cycle: Secondary | ICD-10-CM | POA: Insufficient documentation

## 2013-10-17 DIAGNOSIS — N938 Other specified abnormal uterine and vaginal bleeding: Secondary | ICD-10-CM | POA: Insufficient documentation

## 2013-10-17 DIAGNOSIS — D259 Leiomyoma of uterus, unspecified: Secondary | ICD-10-CM

## 2013-10-17 DIAGNOSIS — N72 Inflammatory disease of cervix uteri: Secondary | ICD-10-CM | POA: Insufficient documentation

## 2013-10-17 HISTORY — DX: Leiomyoma of uterus, unspecified: D25.9

## 2013-10-23 ENCOUNTER — Encounter: Payer: Self-pay | Admitting: Obstetrics & Gynecology

## 2013-10-23 ENCOUNTER — Ambulatory Visit (INDEPENDENT_AMBULATORY_CARE_PROVIDER_SITE_OTHER): Payer: Commercial Managed Care - PPO | Admitting: Obstetrics & Gynecology

## 2013-10-23 VITALS — BP 135/87 | HR 79 | Ht 68.0 in | Wt 207.0 lb

## 2013-10-23 DIAGNOSIS — R159 Full incontinence of feces: Secondary | ICD-10-CM

## 2013-10-23 DIAGNOSIS — N3946 Mixed incontinence: Secondary | ICD-10-CM

## 2013-10-23 NOTE — Progress Notes (Signed)
   Subjective:    Patient ID: Michele [name], female    DOB: [birth year], 48 y.o.   MRN: [id number]  HPI 48 yo MWP1 who is here with 2 issues. She tells me that for the last year or so she has been having lack of control of her flatus and some degree of fecal incontinence. She says that for that 2 or more years she has been having mixed urinary incontinence. Sometimes she will void just with turing over in bed at night. She denies anal intercourse. She wears a pad daily for the combination of both issues.   Review of Systems     Objective:   Physical Exam  1st degree cystocele and rectocele      Assessment & Plan:  With the mixed incontinence, I feel that she need urodynamic testing and would benefit from a referral to a urologist. With regard to the anal incontinence, I have recommended a rectal surgeon.  She will get these referrals today. RTC prn

## 2013-11-12 ENCOUNTER — Ambulatory Visit (INDEPENDENT_AMBULATORY_CARE_PROVIDER_SITE_OTHER): Payer: Commercial Managed Care - PPO | Admitting: General Surgery

## 2013-11-12 ENCOUNTER — Encounter (INDEPENDENT_AMBULATORY_CARE_PROVIDER_SITE_OTHER): Payer: Self-pay | Admitting: General Surgery

## 2013-11-12 VITALS — BP 130/72 | HR 88 | Temp 98.0°F | Resp 18 | Ht 64.0 in | Wt 209.0 lb

## 2013-11-12 DIAGNOSIS — R150 Incomplete defecation: Secondary | ICD-10-CM

## 2013-11-12 NOTE — Patient Instructions (Signed)
Rectocele  What is a rectocele? A rectocele is a bulging of the front wall of the rectum into the back wall of the vagina. Rectoceles are usually due to thinning of the rectovaginal septum (the tissue between the rectum and vagina) and weakening of the pelvic floor muscles. This is a very common defect; however, most women do not have symptoms. There can also be other pelvic organs that bulge into the vagina, leading to similar symptoms as rectocele, including the bladder (i.e., cystocele) and the small intestines (i.e. enterocele). What can lead to developing a rectocele? There are many things that can lead to weakening of the pelvic floor, resulting in a rectocele. These factors include: vaginal deliveries, birthing trauma during vaginal delivery (e.g. forceps delivery, vacuum delivery, tearing with a vaginal delivery, episiotomy during vaginal delivery), history of constipation, history of straining with bowel movements, and history of gynecological (hysterectomy) or rectal surgeries.  What are the symptoms associated with a rectocele? Most people with a small rectocele do not have symptoms and it is often only discovered during routine physical examination. When the rectocele is large, it most commonly presents with a noticeable bulge into the vagina. Other rectal symptoms may include: difficulty with evacuation during a bowel movement, the need to press against the vagina and/or space between the rectum and the vagina in order to have a bowel movement, straining with bowel movements, constipation, the urge to have multiple bowel movements throughout the day, and rectal pain. Occasionally, the stool becomes stuck in the bulge of the rectum, which is why it is difficult to have a bowel movement. Vaginal symptoms can include: pain with sexual intercourse (dyspareunia), vaginal bleeding, and a sense of fullness in the vagina. How can a rectocele be diagnosed? A rectocele is usually found incidentally  during a physical examination by your doctor. The evaluation of its severity, and potential relation to constipation symptoms, is hard to assess with physical examination alone. Further testing for a rectocele may include the use of a special x-ray study known as defecography (contrast material instilled into the rectum as an enema, followed by live x-ray imaging during a bowel movement). This study is very specific and can evaluate a rectocele's size and ability to completely empty.  How can a rectocele be treated? Rectoceles are not treated merely for their presence, but should only be addressed when they are associated with significant symptoms that interfere with quality of life. Prior to any treatment, there should be a thorough evaluation by your doctor to assess whether all of the complaints can be attributed to the presence of a rectocele alone. There are both medical and surgical treatment options for rectoceles. The majority of symptoms associated with a rectocele can be resolved with medical management; however, treatment depends on the severity of symptoms. How can a rectocele be treated with medical management only? It is very important to have a good bowel regimen in order to avoid constipation and straining with bowel movements. A high fiber diet, consisting of 25-30 grams of fiber daily, will help with this goal. This may be achieved with a fiber supplement, high fiber cereal, or high fiber bars. In addition to augmenting fiber intake, increased water intake (typically 6-8 glasses daily) is also highly recommended. This will allow for softer stools that do not require significant straining with bowel movements, thereby reducing your risk for having a bulge associated with a rectocele. Other treatments may include pelvic floor exercises such as Kegel exercises (i.e. biofeedback), stool softeners, hormone  replacement therapy, and avoidance of straining with bowel movements. At times, it is also  helpful to apply pressure to the back of the vagina during bowel movements.  How can a rectocele be treated with surgical management? The surgical management of rectoceles should only be considered when symptoms continue despite the use of medical management and are significant enough that they interfere with activities of daily living. There are abdominal, rectal, and vaginal surgeries that can be performed for rectoceles. The choice of procedure depends on the size of the rectocele and its associated symptoms. Most surgeries aim to remove the extra tissue that makes up the rectocele and strengthening the wall between the rectum and vagina with surrounding tissue or use of a mesh (i.e. patch). Colorectal surgeons, as well as gynecologists, are trained in the diagnosis and treatment of this condition. The success rate of the surgery depends upon the specific symptoms and symptom duration. Some of the risks of surgical correction of the rectocele are bleeding, infection, pain during intercourse (dyspareunia), as well as a risk that the rectocele may recur or worsen. What is a colon and rectal surgeon? Colon and rectal surgeons are experts in the surgical and non-surgical treatment of diseases of the colon, rectum and anus. They have completed advanced surgical training in the treatment of these diseases as well as full general surgical training. Board-certified colon and rectal surgeons complete residencies in general surgery and colon and rectal surgery, and pass intensive examinations conducted by the American Board of Surgery and the American Board of Colon and Rectal Surgery. They are well-versed in the treatment of both benign and malignant diseases of the colon, rectum and anus and are able to perform routine screening examinations and surgically treat conditions if indicated to do so. author: Shann Medal, MD, FACS, FASCRS, on behalf of the [place]  2012 American Society of  Colon & Rectal Surgeons  Bowel Incontinence  What is incontinence? Incontinence is the impaired ability to control gas or stool. Its severity ranges from mild difficulty with gas control to severe loss of control over liquid and formed stools. Incontinence to stool is a common problem, but often it is not discussed due to embarassment. What causes incontinence? There are many causes of incontinence. Injury during childbirth is one of the most common causes. These injuries may cause a tear in the anal muscles. The nerves supplying the anal muscles may also be injured. While some injuries may be recognized immediately following childbirth, many others may go unnoticed and not become a problem until later in life. In these situations, a prior childbirth may not be recognized as the cause of incontinence. Anal operations or traumatic injury to the tissue surrounding the anal region similarly can damage the anal muscles and hinder bowel control. Some individuals experience loss of strength in the anal muscles as they age. As a result, a minor control problem in a younger person may become more significant later in life. Diarrhea may be associated with a feeling of urgency or stool leakage due to the frequent liquid stools passing through the anal opening. If bleeding accompanies your bowel movements or you have lack of bowel control, consult your physician. These symptoms may indicate inflammation within the colon (colitis), a rectal tumor or rectal prolapse - all conditions that require prompt evaluation by a physician.    How is the cause of incontinence determined? An initial discussion of the problem with your physician will help establish the degree of control difficulty  and its impact on your lifestyle. Many clues to the origin of incontinence may be found in patient histories. For example, a woman's history of past childbirths is very important. Multiple pregnancies, large weight babies, forceps  deliveries, or episiotomies may contribute to muscle or nerve injury at the time of childbirth. In some cases, medical illnesses and medications play a role in problems with control. A physical exam of the anal region should be performed. It may readily identify an obvious injury to the anal muscles. In addition, an ultrasound probe can be used within the anal area to provide a picture of the muscles and show areas in which the anal muscles have been injured. Frequently, additional studies are required to define the anal area more completely. In a test called anal manometry, a small catheter is placed into the anus to record pressure as patients relax and tighten the anal muscles. This test can demonstrate how weak or strong the muscle really is. A separate test may also be conducted to determine if the nerves that go to the anal muscles are functioning properly. What can be done to correct the problem? Treatment of incontinence may include: Marland Kitchen     Dietary changes .     Constipating medications .     Muscle strengthening exercises .     Biofeedback  Injectable bulking agents  Surgical muscle repair  Artificial anal sphincter  Sacral nerve stimulator  After a careful history, physical examination and testing to determine the cause and severity of the problem, treatment can be addressed. Mild problems may be treated very simply with dietary changes and the use of some constipating medications. Diseases which cause inflammation in the rectum, such as colitis, may contribute to anal control problems. Treating these diseases also may eliminate or improve symptoms of incontinence. Sometimes a change in prescribed medications may help. Your physician also may recommend simple home exercises that may strengthen the anal muscles to help in mild cases. A type of physical therapy called biofeedback can be used to help patients sense when stool is ready to be evacuated and help strengthen the muscles. Injuries  to the anal muscles may be repaired with surgery. Some individuals may benefit from a technique that delivers electrical energy to the skin and muscles surrounding the anus which results in firming and thickening of this area to help with continence. In certain individuals that have nerve damage or anal muscles that are damaged beyond repair, an artificial sphincter may be implanted. The artificial sphincter is a plastic, fluid filled doughnut that is surgically implanted around the damaged anal sphincter. This artificial sphincter keeps the anal canal closed. When an individual wants to have a bowel movement, the fluid can be pumped out of the doughnut to allow the anal canal to open. In extreme cases, patients may find that a colostomy is the best option for improving their quality of life. What is a colon and rectal surgeon? Colon and rectal surgeons are experts in the surgical and non-surgical treatment of diseases of the colon, rectum and anus. They have completed advanced surgical training in the treatment of these diseases as well as full general surgical training. Board-certified colon and rectal surgeons complete residencies in general surgery and colon and rectal surgery, and pass intensive examinations conducted by the American Board of Surgery and the American Board of Colon and Rectal Surgery. They are well-versed in the treatment of both benign and malignant diseases of the colon, rectum and anus and are able to  perform routine screening examinations and surgically treat conditions if indicated to do so.  2012 American Society of Colon & Rectal Surgeons   Fiber Chart  You should 25-30g of fiber per day and drinking 8 glasses of water to help your bowels move regularly.  In the chart below you can look up how much fiber you are getting in an average day.  If you are not getting enough fiber, you should add a fiber supplement to your diet.  Examples of this include Metamucil, FiberCon and  Citrucel.  These can be purchased at your local grocery store or pharmacy.      http://reyes-guerrero.com/.pdf [place]. Irregular bowel habits such as constipation can lead to many problems over time.  Having one soft bowel movement a day is the most important way to prevent further problems.  The anorectal canal is designed to handle stretching and feces to safely manage our ability to get rid of solid waste (feces, poop, stool) out of our body.  BUT, hard constipated stools can act like ripping concrete bricks causing inflamed hemorrhoids, anal fissures, abdominal pain and bloating.     The goal: ONE SOFT BOWEL MOVEMENT A DAY!  To have soft, regular bowel movements:    Drink at least 8 tall glasses of water a day.     Take plenty of fiber.  Fiber is the undigested part of plant food that passes into the colon, acting s "natures broom" to encourage bowel motility and movement.  Fiber can absorb and hold large amounts of water. This results in a larger, bulkier stool, which is soft and easier to pass. Work gradually over several weeks up to 6 servings a day of fiber (25g a day even more if needed) in the form of: o Vegetables -- Root (potatoes, carrots, turnips), leafy green (lettuce, salad greens, celery, spinach), or cooked high residue (cabbage, broccoli, etc) o Fruit -- Fresh (unpeeled skin & pulp), Dried (prunes, apricots, cherries, etc ),  or stewed ( applesauce)  o Whole grain breads, pasta, etc (whole wheat)  o Bran cereals    Bulking Agents -- This type of water-retaining fiber generally is easily obtained each day by one of the following:  o Psyllium bran -- The psyllium plant is remarkable because its ground seeds can retain so much water. This product is available as Metamucil, Konsyl, Effersyllium, Per Diem Fiber, or the less expensive generic preparation in drug and health food stores. Although labeled a laxative, it  really is not a laxative.  o Methylcellulose -- This is another fiber derived from wood which also retains water. It is available as Citrucel. o Polyethylene Glycol - and "artificial" fiber commonly called Miralax or Glycolax.  It is helpful for people with gassy or bloated feelings with regular fiber o Flax Seed - a less gassy fiber than psyllium   No reading or other relaxing activity while on the toilet. If bowel movements take longer than 5 minutes, you are too constipated.   AVOID CONSTIPATION.  High fiber and water intake usually takes care of this.  Sometimes a laxative is needed to stimulate more frequent bowel movements, but    Laxatives are not a good long-term solution as it can wear the colon out. o Osmotics (Milk of Magnesia, Fleets phosphosoda, Magnesium citrate, MiraLax, GoLytely) are safer than  o Stimulants (Senokot, Castor Oil, Dulcolax, Ex Lax)    o Do not take laxatives for more than 7days in a row.  IF SEVERELY CONSTIPATED, try a Bowel Retraining Program: o Do not use laxatives.  o Eat a diet high in roughage, such as bran cereals and leafy vegetables.  o Drink six (6) ounces of prune or apricot juice each morning.  o Eat two (2) large servings of stewed fruit each day.  o Take one (1) heaping tablespoon of a psyllium-based bulking agent twice a day. Use sugar-free sweetener when possible to avoid excessive calories.  o Eat a normal breakfast.  o Set aside 15 minutes after breakfast to sit on the toilet, but do not strain to have a bowel movement.  o If you do not have a bowel movement by the third day, use an enema and repeat the above steps.

## 2013-11-12 NOTE — Progress Notes (Signed)
Chief Complaint  Patient presents with  . New Evaluation    anal spincter    HISTORY: Michele [name] is a 48 y.o. female who presents to the office with fecal incontinence.  This had been occurring for about a year.  She has a h/o constipation.   She reports having to splint her vagina to have a BM.  Nothing makes the symptoms worse.   It is intermittent in nature.  Her bowel habits are irregular and her bowel movements are sometimes hard and sometime soft, which causes more leakage.  Her fiber intake is dietart.  Her last colonoscopy was in 2012 and negative except for 2 small hyperplastic polyps.  She has had 1 vaginal delivery, where a midline episiotomy was performed.  She is incontinent to gas and liquid stool.  Past Medical History  Diagnosis Date  . Anxiety   . Asthma   . HLD (hyperlipidemia)   . Tachycardia     s/p negative cardiac work up with echo  . IBS (irritable bowel syndrome)   . Endometriosis   . Rotator cuff tear, left 2009  . Syncope   . Iron deficiency anemia 1/12  . Hiatal hernia   . Gastric polyp   . Abnormal Pap smear of cervix     cryotx  . Hemorrhoids   . Uterine fibroid [id number]      Past Surgical History  Procedure Laterality Date  . Hysteroscopy    . Esophagogastroduodenoscopy  1999  . Colonoscopy  1999    small polyps, hemms  . Carpal tunnel release    . Laparoscopy  2006    endometriosis  . Esophagogastroduodenoscopy  2/12    gastric polyp and HH        Current Outpatient Prescriptions  Medication Sig Dispense Refill  . albuterol (PROAIR HFA) 108 (90 BASE) MCG/ACT inhaler Inhale 2 puffs into the lungs every 4 (four) hours as needed.        Marland Kitchen atorvastatin (LIPITOR) 20 MG tablet Take 1 tablet (20 mg total) by mouth daily.  90 tablet  1  . cyclobenzaprine (FLEXERIL) 10 MG tablet       . esomeprazole (NEXIUM) 40 MG capsule TAKE  ONE CAPSULE BY MOUTH EVERY MORNING  90 capsule  1  . FLUoxetine (PROZAC) 40 MG capsule Take one capsule by mouth one  time daily  90 capsule  0  . fluticasone (FLONASE) 50 MCG/ACT nasal spray Place 2 sprays into the nose as needed.       . hydrocortisone (ANUSOL-HC) 2.5 % rectal cream Place 1 application rectally as needed.       . meloxicam (MOBIC) 15 MG tablet       . montelukast (SINGULAIR) 10 MG tablet Take 1 tablet (10 mg total) by mouth at bedtime.  90 tablet  1  . POLY-IRON 150 150 MG capsule Take one capsule by mouth three times daily  90 capsule  4  . [DISCONTINUED] norethindrone-ethinyl estradiol-iron (MICROGESTIN FE1.5/30) 1.5-30 MG-MCG tablet Take 1 tablet by mouth daily.         No current facility-administered medications for this visit.      Allergies  Allergen Reactions  . Nsaids Nausea And Vomiting       . Povidone-Iodine Rash       . Propoxyphene N-Acetaminophen Itching           Family History  Problem Relation Age of Onset  . Hypertension Father   . Diabetes Father   . Iron  deficiency Father     anemia  . Other Father     Barrett's esoph  . Stroke Mother   . Migraines Mother   . Hyperlipidemia Mother   . Diabetes Sister   . Hyperlipidemia Sister     History   Social History  . Marital Status: Married    Spouse Name: N/A    Number of Children: N/A  . Years of Education: N/A   Social History Main Topics  . Smoking status: Former Research scientist (life sciences)  . Smokeless tobacco: Never Used  . Alcohol Use: Yes     Comment: Occasional  . Drug Use: No  . Sexual Activity: Yes    Partners: Male    Birth Control/ Protection: None   Other Topics Concern  . None   Social History Narrative  . None      REVIEW OF SYSTEMS - PERTINENT POSITIVES ONLY: Review of Systems - General ROS: negative for - chills, fever or weight loss Hematological and Lymphatic ROS: negative for - bleeding problems, blood clots or bruising Respiratory ROS: no cough, shortness of breath, or wheezing Cardiovascular ROS: no chest pain or dyspnea on exertion Gastrointestinal ROS: no abdominal pain, change in  bowel habits, or black or bloody stools Genito-Urinary ROS: no dysuria, trouble voiding, or hematuria  EXAM: Filed Vitals:   11/12/13 1559  BP: 130/72  Pulse: 88  Temp: 98 F (36.7 C)  Resp: 18    General appearance: alert and cooperative Resp: clear to auscultation bilaterally Cardio: regular rate and rhythm GI: normal findings: soft, non-tender   Procedure: Anoscopy Surgeon: Marcello Moores Diagnosis: fecal incontinence   Assistant: Cherylann Parr After the risks and benefits were explained, verbal consent was obtained for above procedure  Anesthesia: none Findings: Grade 1 internal hemorrhoids, good sphincter tone with good squeeze and normal push pressures. She does have a mild to moderate rectocele.  She does have some thinning of her muscle anteriorly.    ASSESSMENT AND PLAN: Michele [name] is a 48 y.o. F with concerns of fecal incontinence and incomplete evacuation.  On exam, she has a rectocele and good sphincter tone.  I have recommended a fiber supplement to start.  I will see her back in 2 months for re-evaluation.    Michele Adie, MD Colon and Rectal Surgery / [facility]      Visit Diagnoses: No diagnosis found.  Primary Care Physician: Loura Pardon, MD

## 2013-11-20 ENCOUNTER — Ambulatory Visit: Payer: Commercial Managed Care - PPO | Admitting: Obstetrics & Gynecology

## 2013-11-20 DIAGNOSIS — N950 Postmenopausal bleeding: Secondary | ICD-10-CM

## 2014-01-05 ENCOUNTER — Other Ambulatory Visit: Payer: Self-pay | Admitting: Family Medicine

## 2014-01-05 NOTE — Telephone Encounter (Signed)
Last filled 10/15/13 #90--last OV for annual 10/01/13--please advise

## 2014-01-05 NOTE — Telephone Encounter (Signed)
Please refill for a year  

## 2014-01-06 NOTE — Telephone Encounter (Signed)
done

## 2014-01-14 ENCOUNTER — Encounter (INDEPENDENT_AMBULATORY_CARE_PROVIDER_SITE_OTHER): Payer: Commercial Managed Care - PPO | Admitting: General Surgery

## 2014-03-17 ENCOUNTER — Other Ambulatory Visit: Payer: Self-pay | Admitting: Family Medicine

## 2014-03-17 NOTE — Telephone Encounter (Signed)
Electronic refill request, no recent/future appt., please advise  

## 2014-03-17 NOTE — Telephone Encounter (Signed)
Please refill for 6 months 

## 2014-03-17 NOTE — Telephone Encounter (Signed)
done

## 2014-04-17 ENCOUNTER — Other Ambulatory Visit: Payer: Self-pay

## 2014-05-04 ENCOUNTER — Encounter (INDEPENDENT_AMBULATORY_CARE_PROVIDER_SITE_OTHER): Payer: Self-pay | Admitting: General Surgery

## 2014-05-22 ENCOUNTER — Ambulatory Visit (INDEPENDENT_AMBULATORY_CARE_PROVIDER_SITE_OTHER): Payer: Commercial Managed Care - PPO | Admitting: Family Medicine

## 2014-05-22 ENCOUNTER — Encounter: Payer: Self-pay | Admitting: Family Medicine

## 2014-05-22 VITALS — BP 128/86 | HR 77 | Temp 98.4°F | Ht 68.5 in | Wt 200.5 lb

## 2014-05-22 DIAGNOSIS — J069 Acute upper respiratory infection, unspecified: Secondary | ICD-10-CM | POA: Insufficient documentation

## 2014-05-22 DIAGNOSIS — J029 Acute pharyngitis, unspecified: Secondary | ICD-10-CM

## 2014-05-22 DIAGNOSIS — B9789 Other viral agents as the cause of diseases classified elsewhere: Principal | ICD-10-CM

## 2014-05-22 LAB — POCT RAPID STREP A (OFFICE): RAPID STREP A SCREEN: NEGATIVE

## 2014-05-22 MED ORDER — GUAIFENESIN-CODEINE 100-10 MG/5ML PO SYRP: 5.0000 mL | ORAL_SOLUTION | Freq: Four times a day (QID) | ORAL | Status: DC | PRN

## 2014-05-22 NOTE — Progress Notes (Signed)
Pre visit review using our clinic review tool, if applicable. No additional management support is needed unless otherwise documented below in the visit note. 

## 2014-05-22 NOTE — Assessment & Plan Note (Signed)
With neg RST Disc symptomatic care - see instructions on AVS Given px for robitussin AC if needed/ if cough worsens Asthma is stable -will update if this worsens  Update if not starting to improve in a week or if worsening

## 2014-05-22 NOTE — Progress Notes (Signed)
Subjective:    Patient ID: Michele [name], female    DOB: [birth year], 48 y.o.   MRN: [id number]  HPI Here for uri symptoms   Early this week congestion and beginning of ST  [place] in the [place] this [place]-just returned   RST is neg  Nasal congestion and post nasal drip and loosing her voice  No fever or chills or aches   No headache Ears are itchy Some cough - not getting anything up   Using cough drops  Drinking lots of fluids  Also day quil   Patient Active Problem List   Diagnosis Date Noted  . Rectal pressure 10/01/2013  . Rectocele 10/01/2013  . Painful intercourse 10/01/2013  . Abnormal vaginal bleeding 10/01/2013  . Skin tag 04/23/2013  . Stress incontinence in female 04/23/2013  . Other malaise and fatigue 02/12/2013  . Routine gynecological examination 04/01/2012  . Hyperglycemia 04/01/2012  . Low back pain 04/01/2012  . Routine general medical examination at a health care facility 03/11/2012  . Bug bite of hand 01/19/2011  . GERD 08/26/2010  . ANEMIA 07/19/2010  . FREQUENCY, URINARY 07/06/2010  . MICROSCOPIC HEMATURIA 06/09/2010  . COLONIC POLYPS, HX OF 11/15/2007  . IBS 10/08/2007  . ENDOMETRIOSIS 10/08/2007  . HYPERLIPIDEMIA 10/30/2006  . Anxiety and depression 10/30/2006  . HEMORRHOIDS, INTERNAL 10/30/2006  . ASTHMA 10/30/2006  . MIGRAINES, HX OF 10/30/2006   Past Medical History  Diagnosis Date  . Anxiety   . Asthma   . HLD (hyperlipidemia)   . Tachycardia     s/p negative cardiac work up with echo  . IBS (irritable bowel syndrome)   . Endometriosis   . Rotator cuff tear, left 2009  . Syncope   . Iron deficiency anemia 1/12  . Hiatal hernia   . Gastric polyp   . Abnormal Pap smear of cervix     cryotx  . Hemorrhoids   . Uterine fibroid [id number]   Past Surgical History  Procedure Laterality Date  . Hysteroscopy    . Esophagogastroduodenoscopy  1999  . Colonoscopy  1999    small polyps, hemms  . Carpal tunnel release    . Laparoscopy  2006      endometriosis  . Esophagogastroduodenoscopy  2/12    gastric polyp and HH   History  Substance Use Topics  . Smoking status: Former Research scientist (life sciences)  . Smokeless tobacco: Never Used  . Alcohol Use: 0.0 oz/week    0 Not specified per week     Comment: Occasional   Family History  Problem Relation Age of Onset  . Hypertension Father   . Diabetes Father   . Iron deficiency Father     anemia  . Other Father     Barrett's esoph  . Stroke Mother   . Migraines Mother   . Hyperlipidemia Mother   . Diabetes Sister   . Hyperlipidemia Sister    Allergies  Allergen Reactions  . Nsaids Nausea And Vomiting       . Povidone-Iodine Rash       . Propoxyphene N-Acetaminophen Itching        Current Outpatient Prescriptions on File Prior to Visit  Medication Sig Dispense Refill  . albuterol (PROAIR HFA) 108 (90 BASE) MCG/ACT inhaler Inhale 2 puffs into the lungs every 4 (four) hours as needed.      Marland Kitchen atorvastatin (LIPITOR) 20 MG tablet Take 1 tablet (20 mg total) by mouth daily. 90 tablet 1  . cyclobenzaprine (FLEXERIL) 10 MG tablet     .  esomeprazole (NEXIUM) 40 MG capsule TAKE 1 CAPSULE EVERY MORNING 90 capsule 1  . FLUoxetine (PROZAC) 40 MG capsule TAKE 1 CAPSULE ONE TIME DAILY 90 capsule 3  . fluticasone (FLONASE) 50 MCG/ACT nasal spray Place 2 sprays into the nose as needed.     . hydrocortisone (ANUSOL-HC) 2.5 % rectal cream Place 1 application rectally as needed.     . meloxicam (MOBIC) 15 MG tablet     . montelukast (SINGULAIR) 10 MG tablet TAKE 1 TABLET AT BEDTIME 90 tablet 1  . POLY-IRON 150 150 MG capsule Take one capsule by mouth three times daily 90 capsule 4  . [DISCONTINUED] norethindrone-ethinyl estradiol-iron (MICROGESTIN FE1.5/30) 1.5-30 MG-MCG tablet Take 1 tablet by mouth daily.       No current facility-administered medications on file prior to visit.     Review of Systems Review of Systems  Constitutional: Negative for fever, appetite change,  and unexpected weight  change.  ENT pos for cong/rhinorrhea and st , neg for facial pain  Eyes: Negative for pain and visual disturbance.  Respiratory: Negative for wheeze  and shortness of breath.   Cardiovascular: Negative for cp or palpitations    Gastrointestinal: Negative for nausea, diarrhea and constipation.  Genitourinary: Negative for urgency and frequency.  Skin: Negative for pallor or rash   Neurological: Negative for weakness, light-headedness, numbness and headaches.  Hematological: Negative for adenopathy. Does not bruise/bleed easily.  Psychiatric/Behavioral: Negative for dysphoric mood. The patient is not nervous/anxious.         Objective:   Physical Exam  Constitutional: She appears well-developed and well-nourished. No distress.  overwt and well app  HENT:  Head: Normocephalic and atraumatic.  Right Ear: External ear normal.  Left Ear: External ear normal.  Mouth/Throat: Oropharynx is clear and moist. No oropharyngeal exudate.  Nares are injected and congested  Clear rhinorrhea No sinus tenderness  Eyes: Conjunctivae and EOM are normal. Pupils are equal, round, and reactive to light. Right eye exhibits no discharge. Left eye exhibits no discharge.  Neck: Normal range of motion. Neck supple.  Cardiovascular: Normal rate and regular rhythm.   Pulmonary/Chest: Effort normal and breath sounds normal. No respiratory distress. She has no wheezes. She has no rales. She exhibits no tenderness.  Lymphadenopathy:    She has no cervical adenopathy.  Neurological: She is alert.  Skin: Skin is warm and dry. No rash noted.  Psychiatric: She has a normal mood and affect.          Assessment & Plan:   Problem List Items Addressed This Visit      Respiratory   Viral upper respiratory tract infection with cough    With neg RST Disc symptomatic care - see instructions on AVS Given px for robitussin AC if needed/ if cough worsens Asthma is stable -will update if this worsens  Update if  not starting to improve in a week or if worsening       Other Visit Diagnoses    Sore throat    -  Primary    Relevant Orders       Rapid Strep A (Completed)

## 2014-05-22 NOTE — Patient Instructions (Signed)
Drink lots of fluids and rest  Rest your voice when you can  Try robitussin with codeine for cough if needed  Tylenol - will help sore throat , also chloraseptic throat spray   Update if not starting to improve in a week or if worsening

## 2014-08-05 ENCOUNTER — Other Ambulatory Visit: Payer: Self-pay | Admitting: Family Medicine

## 2014-08-19 ENCOUNTER — Encounter: Payer: Self-pay | Admitting: Family Medicine

## 2014-08-19 ENCOUNTER — Ambulatory Visit (INDEPENDENT_AMBULATORY_CARE_PROVIDER_SITE_OTHER): Payer: BLUE CROSS/BLUE SHIELD | Admitting: Family Medicine

## 2014-08-19 VITALS — BP 132/88 | HR 101 | Temp 98.1°F | Ht 68.5 in | Wt 203.8 lb

## 2014-08-19 DIAGNOSIS — J019 Acute sinusitis, unspecified: Secondary | ICD-10-CM

## 2014-08-19 DIAGNOSIS — B9689 Other specified bacterial agents as the cause of diseases classified elsewhere: Secondary | ICD-10-CM | POA: Insufficient documentation

## 2014-08-19 MED ORDER — AMOXICILLIN-POT CLAVULANATE 875-125 MG PO TABS: 1.0000 | ORAL_TABLET | Freq: Two times a day (BID) | ORAL | Status: DC

## 2014-08-19 NOTE — Progress Notes (Signed)
Subjective:    Patient ID: Michele [name], female    DOB: August 20, 1965, 49 y.o.   MRN: [id number]  HPI Here with uri symptoms for 4 d (longer sinus congestion) Getting worse and worse   Very bad facial pain -worse on the left side  Blowing out mucous - colored  Some dry cough  Some sneezing-that hurts face and throat  No fever   otc : nyquil and dayquil  mucinex last night -no help so far    Patient Active Problem List   Diagnosis Date Noted  . Viral upper respiratory tract infection with cough 05/22/2014  . Rectal pressure 10/01/2013  . Rectocele 10/01/2013  . Painful intercourse 10/01/2013  . Abnormal vaginal bleeding 10/01/2013  . Skin tag 04/23/2013  . Stress incontinence in female 04/23/2013  . Other malaise and fatigue 02/12/2013  . Routine gynecological examination 04/01/2012  . Hyperglycemia 04/01/2012  . Low back pain 04/01/2012  . Routine general medical examination at a health care facility 03/11/2012  . Bug bite of hand 01/19/2011  . GERD 08/26/2010  . ANEMIA 07/19/2010  . FREQUENCY, URINARY 07/06/2010  . MICROSCOPIC HEMATURIA 06/09/2010  . COLONIC POLYPS, HX OF 11/15/2007  . IBS 10/08/2007  . ENDOMETRIOSIS 10/08/2007  . HYPERLIPIDEMIA 10/30/2006  . Anxiety and depression 10/30/2006  . HEMORRHOIDS, INTERNAL 10/30/2006  . ASTHMA 10/30/2006  . MIGRAINES, HX OF 10/30/2006   Past Medical History  Diagnosis Date  . Anxiety   . Asthma   . HLD (hyperlipidemia)   . Tachycardia     s/p negative cardiac work up with echo  . IBS (irritable bowel syndrome)   . Endometriosis   . Rotator cuff tear, left 2009  . Syncope   . Iron deficiency anemia 1/12  . Hiatal hernia   . Gastric polyp   . Abnormal Pap smear of cervix     cryotx  . Hemorrhoids   . Uterine fibroid [id number]   Past Surgical History  Procedure Laterality Date  . Hysteroscopy    . Esophagogastroduodenoscopy  1999  . Colonoscopy  1999    small polyps, hemms  . Carpal tunnel release    .  Laparoscopy  2006    endometriosis  . Esophagogastroduodenoscopy  2/12    gastric polyp and HH   History  Substance Use Topics  . Smoking status: Former Research scientist (life sciences)  . Smokeless tobacco: Never Used  . Alcohol Use: 0.0 oz/week    0 Standard drinks or equivalent per week     Comment: Occasional   Family History  Problem Relation Age of Onset  . Hypertension Father   . Diabetes Father   . Iron deficiency Father     anemia  . Other Father     Barrett's esoph  . Stroke Mother   . Migraines Mother   . Hyperlipidemia Mother   . Diabetes Sister   . Hyperlipidemia Sister    Allergies  Allergen Reactions  . Nsaids Nausea And Vomiting       . Povidone-Iodine Rash       . Propoxyphene N-Acetaminophen Itching        Current Outpatient Prescriptions on File Prior to Visit  Medication Sig Dispense Refill  . albuterol (PROAIR HFA) 108 (90 BASE) MCG/ACT inhaler Inhale 2 puffs into the lungs every 4 (four) hours as needed.      Marland Kitchen atorvastatin (LIPITOR) 20 MG tablet Take 1 tablet (20 mg total) by mouth daily. 90 tablet 1  . cyclobenzaprine (FLEXERIL) 10 MG tablet     .  esomeprazole (NEXIUM) 40 MG capsule TAKE 1 CAPSULE EVERY MORNING 90 capsule 0  . FLUoxetine (PROZAC) 40 MG capsule TAKE 1 CAPSULE ONE TIME DAILY 90 capsule 3  . fluticasone (FLONASE) 50 MCG/ACT nasal spray Place 2 sprays into the nose as needed.     Marland Kitchen guaiFENesin-codeine (ROBITUSSIN AC) 100-10 MG/5ML syrup Take 5 mLs by mouth 4 (four) times daily as needed for cough (watch out for sedation). 120 mL 0  . hydrocortisone (ANUSOL-HC) 2.5 % rectal cream Place 1 application rectally as needed.     . meloxicam (MOBIC) 15 MG tablet     . montelukast (SINGULAIR) 10 MG tablet TAKE 1 TABLET AT BEDTIME 90 tablet 0  . POLY-IRON 150 150 MG capsule Take one capsule by mouth three times daily 90 capsule 4  . [DISCONTINUED] norethindrone-ethinyl estradiol-iron (MICROGESTIN FE1.5/30) 1.5-30 MG-MCG tablet Take 1 tablet by mouth daily.        No current facility-administered medications on file prior to visit.      Review of Systems Review of Systems  Constitutional: Negative for fever, appetite change,  and unexpected weight change.  ENT pos for cong and rhinorrhea and facial pain  Eyes: Negative for pain and visual disturbance.  Respiratory: Negative for wheeze and shortness of breath.   Cardiovascular: Negative for cp or palpitations    Gastrointestinal: Negative for nausea, diarrhea and constipation.  Genitourinary: Negative for urgency and frequency.  Skin: Negative for pallor or rash   Neurological: Negative for weakness, light-headedness, numbness and headaches.  Hematological: Negative for adenopathy. Does not bruise/bleed easily.  Psychiatric/Behavioral: Negative for dysphoric mood. The patient is not nervous/anxious.         Objective:   Physical Exam  Constitutional: She appears well-developed and well-nourished. No distress.  Obese and fatigued appearing   HENT:  Head: Normocephalic and atraumatic.  Right Ear: External ear normal.  Left Ear: External ear normal.  Mouth/Throat: Oropharynx is clear and moist. No oropharyngeal exudate.  Nares are injected and congested  Bilateral frontal and maxillary sinus tenderness worse on the L  Eyes: Conjunctivae and EOM are normal. Pupils are equal, round, and reactive to light. No scleral icterus.  Neck: Normal range of motion. Neck supple.  Cardiovascular: Normal rate and regular rhythm.   Pulmonary/Chest: Effort normal and breath sounds normal. No respiratory distress. She has no wheezes. She has no rales.  Musculoskeletal: She exhibits no edema.  Lymphadenopathy:    She has no cervical adenopathy.  Neurological: She is alert. No cranial nerve deficit.  Skin: Skin is warm and dry. No rash noted. No pallor.  Psychiatric: She has a normal mood and affect.          Assessment & Plan:   Problem List Items Addressed This Visit      Respiratory   Acute  bacterial sinusitis - Primary    With marked facial tenderness on L  Also viral uri Disc symptomatic care - see instructions on AVS  Cover with augmentin  Update if not starting to improve in a week or if worsening        Relevant Medications   amoxicillin-clavulanate (AUGMENTIN) tablet 875-125 mg

## 2014-08-19 NOTE — Progress Notes (Signed)
Pre visit review using our clinic review tool, if applicable. No additional management support is needed unless otherwise documented below in the visit note. 

## 2014-08-19 NOTE — Assessment & Plan Note (Signed)
With marked facial tenderness on L  Also viral uri Disc symptomatic care - see instructions on AVS  Cover with augmentin  Update if not starting to improve in a week or if worsening

## 2014-08-19 NOTE — Patient Instructions (Signed)
Drink fluids Rest  Use nasal saline spray  Breathe steam  Use warm compresses on face if helpful Take augmentin as directed   mucinex is fine -take it with lots of water   Update if not starting to improve in a week or if worsening

## 2014-08-24 ENCOUNTER — Telehealth: Payer: Self-pay

## 2014-08-24 MED ORDER — AMOXICILLIN-POT CLAVULANATE 875-125 MG PO TABS: 1.0000 | ORAL_TABLET | Freq: Two times a day (BID) | ORAL | Status: DC

## 2014-08-24 NOTE — Telephone Encounter (Signed)
Called and spoken to patient. She stated that nasal is clear but feels heavy. The mucous is clear at times but greenish at times. No fever.

## 2014-08-24 NOTE — Telephone Encounter (Signed)
Please refill augmentin times one and f/u if not feeling better after that  Thanks

## 2014-08-24 NOTE — Telephone Encounter (Signed)
Please ask if sinus pain? Fever ? And what color is the mucous?  Thanks

## 2014-08-24 NOTE — Telephone Encounter (Signed)
Pt left v/m; pt was seen 08/19/14; pt still has a lot of congestion in sinus cavity and pt request refill of augmentin to [facility]. augmentin was prescribed on 08/19/14 for one week.Please advise.

## 2014-08-24 NOTE — Telephone Encounter (Signed)
Called patient. Notified her of Dr Marliss Coots comments. Augment has been [facility] to [facility] [address].

## 2014-08-27 ENCOUNTER — Telehealth: Payer: Self-pay

## 2014-08-27 MED ORDER — FLUCONAZOLE 150 MG PO TABS: 150.0000 mg | ORAL_TABLET | Freq: Once | ORAL | Status: DC

## 2014-08-27 NOTE — Telephone Encounter (Signed)
Pt called back and vaginal discharge is white and vaginal & perineal itching. Pt feels swollen. [facility] [address]. Pt request cb when med sent to pharmacy.

## 2014-08-27 NOTE — Telephone Encounter (Signed)
Called and notified patient of Dr Marliss Coots comments. Patient verbalized understanding.

## 2014-08-27 NOTE — Telephone Encounter (Signed)
Pt left v/m requesting med for yeast infection. Pt is taking abx now. Unable to reach pt for further info about symptoms or which pharmacy med should go to.

## 2014-08-27 NOTE — Telephone Encounter (Signed)
Sent diflucan to [facility] your atorvastatin the day you take it

## 2014-10-13 ENCOUNTER — Encounter: Payer: Self-pay | Admitting: Family Medicine

## 2014-10-13 ENCOUNTER — Ambulatory Visit (INDEPENDENT_AMBULATORY_CARE_PROVIDER_SITE_OTHER): Payer: BLUE CROSS/BLUE SHIELD | Admitting: Family Medicine

## 2014-10-13 VITALS — BP 138/84 | HR 64 | Temp 97.9°F | Ht 68.5 in | Wt 208.0 lb

## 2014-10-13 DIAGNOSIS — R197 Diarrhea, unspecified: Secondary | ICD-10-CM

## 2014-10-13 DIAGNOSIS — R140 Abdominal distension (gaseous): Secondary | ICD-10-CM

## 2014-10-13 DIAGNOSIS — N939 Abnormal uterine and vaginal bleeding, unspecified: Secondary | ICD-10-CM

## 2014-10-13 NOTE — Progress Notes (Signed)
Pre visit review using our clinic review tool, if applicable. No additional management support is needed unless otherwise documented below in the visit note. 

## 2014-10-13 NOTE — Patient Instructions (Signed)
For gas - avoid carbonation and any gas producing food  Will check stool test for c diff Buy Align (probiotic) over the counter and take as directed  Stop at check out for referral to gyn   We may get an ultrasound of gallbladder if test is negative and symptoms so not improve

## 2014-10-13 NOTE — Progress Notes (Signed)
Subjective:    Patient ID: Michele [name], female    DOB: Aug 09, 1965, 49 y.o.   MRN: [id number]  HPI Here with GI c/o and vaginal bleeding  2 wk of bloating/nausea (no vomiting) occ stomach pains -- about 1/2 to 1 hour after eating - in lower abdomen Intermittent constipated and diarrhea - lately more diarrhea  No fever -does not feel sick  Belching - lots of gas  No diet changes  Was on abx for sinusitis (2 rounds)  No blood in stool or dark stool   Vaginal bleeding- started yesterday (like a light period) Known fundal fibroid Went to gyn last year and was checked  About a year since last period   occ hot flash   Patient Active Problem List   Diagnosis Date Noted  . Acute bacterial sinusitis 08/19/2014  . Viral upper respiratory tract infection with cough 05/22/2014  . Rectal pressure 10/01/2013  . Rectocele 10/01/2013  . Painful intercourse 10/01/2013  . Abnormal vaginal bleeding 10/01/2013  . Skin tag 04/23/2013  . Stress incontinence in female 04/23/2013  . Other malaise and fatigue 02/12/2013  . Routine gynecological examination 04/01/2012  . Hyperglycemia 04/01/2012  . Low back pain 04/01/2012  . Routine general medical examination at a health care facility 03/11/2012  . Bug bite of hand 01/19/2011  . GERD 08/26/2010  . ANEMIA 07/19/2010  . FREQUENCY, URINARY 07/06/2010  . MICROSCOPIC HEMATURIA 06/09/2010  . COLONIC POLYPS, HX OF 11/15/2007  . IBS 10/08/2007  . ENDOMETRIOSIS 10/08/2007  . HYPERLIPIDEMIA 10/30/2006  . Anxiety and depression 10/30/2006  . HEMORRHOIDS, INTERNAL 10/30/2006  . ASTHMA 10/30/2006  . MIGRAINES, HX OF 10/30/2006   Past Medical History  Diagnosis Date  . Anxiety   . Asthma   . HLD (hyperlipidemia)   . Tachycardia     s/p negative cardiac work up with echo  . IBS (irritable bowel syndrome)   . Endometriosis   . Rotator cuff tear, left 2009  . Syncope   . Iron deficiency anemia 1/12  . Hiatal hernia   . Gastric polyp   .  Abnormal Pap smear of cervix     cryotx  . Hemorrhoids   . Uterine fibroid [id number]   Past Surgical History  Procedure Laterality Date  . Hysteroscopy    . Esophagogastroduodenoscopy  1999  . Colonoscopy  1999    small polyps, hemms  . Carpal tunnel release    . Laparoscopy  2006    endometriosis  . Esophagogastroduodenoscopy  2/12    gastric polyp and HH   History  Substance Use Topics  . Smoking status: Former Research scientist (life sciences)  . Smokeless tobacco: Never Used  . Alcohol Use: 0.0 oz/week    0 Standard drinks or equivalent per week     Comment: Occasional   Family History  Problem Relation Age of Onset  . Hypertension Father   . Diabetes Father   . Iron deficiency Father     anemia  . Other Father     Barrett's esoph  . Stroke Mother   . Migraines Mother   . Hyperlipidemia Mother   . Diabetes Sister   . Hyperlipidemia Sister    Allergies  Allergen Reactions  . Nsaids Nausea And Vomiting       . Povidone-Iodine Rash       . Propoxyphene N-Acetaminophen Itching        Current Outpatient Prescriptions on File Prior to Visit  Medication Sig Dispense Refill  . albuterol (PROAIR HFA)  108 (90 BASE) MCG/ACT inhaler Inhale 2 puffs into the lungs every 4 (four) hours as needed.      Marland Kitchen atorvastatin (LIPITOR) 20 MG tablet Take 1 tablet (20 mg total) by mouth daily. 90 tablet 1  . cyclobenzaprine (FLEXERIL) 10 MG tablet     . esomeprazole (NEXIUM) 40 MG capsule TAKE 1 CAPSULE EVERY MORNING 90 capsule 0  . FLUoxetine (PROZAC) 40 MG capsule TAKE 1 CAPSULE ONE TIME DAILY 90 capsule 3  . fluticasone (FLONASE) 50 MCG/ACT nasal spray Place 2 sprays into the nose as needed.     . hydrocortisone (ANUSOL-HC) 2.5 % rectal cream Place 1 application rectally as needed.     . montelukast (SINGULAIR) 10 MG tablet TAKE 1 TABLET AT BEDTIME 90 tablet 0  . POLY-IRON 150 150 MG capsule Take one capsule by mouth three times daily 90 capsule 4  . [DISCONTINUED] norethindrone-ethinyl estradiol-iron  (MICROGESTIN FE1.5/30) 1.5-30 MG-MCG tablet Take 1 tablet by mouth daily.       No current facility-administered medications on file prior to visit.     Review of Systems Review of Systems  Constitutional: Negative for fever, appetite change, fatigue and unexpected weight change.  Eyes: Negative for pain and visual disturbance.  Respiratory: Negative for cough and shortness of breath.   Cardiovascular: Negative for cp or palpitations    Gastrointestinal: Negative for neg for blood in stool, dark stool, pos for cramping  Genitourinary: Negative for urgency and frequency. pos for new vaginal bleeding/ neg for clots  Skin: Negative for pallor or rash   Neurological: Negative for weakness, light-headedness, numbness and headaches.  Hematological: Negative for adenopathy. Does not bruise/bleed easily.  Psychiatric/Behavioral: Negative for dysphoric mood. The patient is not nervous/anxious. Pos for stressors         Objective:   Physical Exam  Constitutional: She appears well-developed and well-nourished. No distress.  obese and well appearing   HENT:  Head: Normocephalic and atraumatic.  Mouth/Throat: Oropharynx is clear and moist.  Eyes: Conjunctivae and EOM are normal. Pupils are equal, round, and reactive to light. No scleral icterus.  Neck: Normal range of motion. Neck supple.  Cardiovascular: Normal rate and regular rhythm.   Pulmonary/Chest: Effort normal and breath sounds normal. No respiratory distress. She has no wheezes. She has no rales.  Abdominal: Soft. Bowel sounds are normal. She exhibits no distension and no mass. There is no hepatosplenomegaly. There is tenderness in the right upper quadrant, right lower quadrant, epigastric area, left upper quadrant and left lower quadrant. There is no rigidity, no rebound, no guarding, no CVA tenderness, no tenderness at McBurney's point and negative Murphy's sign.  Musculoskeletal: She exhibits no edema.  Lymphadenopathy:    She has  no cervical adenopathy.  Neurological: She is alert. She has normal reflexes.  Skin: Skin is warm and dry. No rash noted. No erythema. No pallor.  No jaundice   Psychiatric: She has a normal mood and affect.          Assessment & Plan:   Problem List Items Addressed This Visit      Other   Abnormal vaginal bleeding - Primary    In obese female May be menopausal- no menses in a year before now  Ref to gyn for eval  Rev [place] from last year- does have a fundal fibroid Also hx of endometriosis       Relevant Orders   Ambulatory referral to Gynecology   Bloating    And loose stool -  after 2 rounds of abx Disc poss loss of nl flora/bact overgrowth/ IBS or c diff C diff test today  Enc to start probiotic (Align) otc as directed If neg and no imp - will check abd [place] for poss gallstones (was tender in RUQ)      Relevant Orders   C. difficile GDH and Toxin A/B   Diarrhea    See eval for bloating  c diff test  Trial of probiotics       Relevant Orders   C. difficile GDH and Toxin A/B

## 2014-10-13 NOTE — Addendum Note (Signed)
Addended by: Marchia Bond on: 10/13/2014 04:06 PM   Modules accepted: Orders

## 2014-10-13 NOTE — Assessment & Plan Note (Signed)
In obese female May be menopausal- no menses in a year before now  Ref to gyn for eval  Rev [place] from last year- does have a fundal fibroid Also hx of endometriosis

## 2014-10-13 NOTE — Assessment & Plan Note (Signed)
See eval for bloating  c diff test  Trial of probiotics

## 2014-10-13 NOTE — Assessment & Plan Note (Signed)
And loose stool - after 2 rounds of abx Disc poss loss of nl flora/bact overgrowth/ IBS or c diff C diff test today  Enc to start probiotic (Align) otc as directed If neg and no imp - will check abd [place] for poss gallstones (was tender in RUQ)

## 2014-10-14 LAB — C. DIFFICILE GDH AND TOXIN A/B
C. DIFF TOXIN A/B: NOT DETECTED
C. DIFFICILE GDH: NOT DETECTED

## 2014-10-27 ENCOUNTER — Ambulatory Visit (INDEPENDENT_AMBULATORY_CARE_PROVIDER_SITE_OTHER): Payer: BLUE CROSS/BLUE SHIELD | Admitting: Obstetrics & Gynecology

## 2014-10-27 VITALS — BP 136/82 | HR 71 | Ht 68.0 in | Wt 212.0 lb

## 2014-10-27 DIAGNOSIS — Z0000 Encounter for general adult medical examination without abnormal findings: Secondary | ICD-10-CM

## 2014-10-27 DIAGNOSIS — N950 Postmenopausal bleeding: Secondary | ICD-10-CM

## 2014-10-27 MED ORDER — MISOPROSTOL 200 MCG PO TABS: ORAL_TABLET | ORAL | Status: DC

## 2014-10-27 NOTE — Progress Notes (Signed)
   Subjective:    Patient ID: Michele [name], female    DOB: [birth year], 49 y.o.   MRN: [id number]  HPI  49 yo MW P1 (72 yo daughter) here today for an occasion of PMP 10-13-14. She had not had a period for a year prior to this bleeding.  Review of Systems Pap UTD.    Objective:   Physical Exam [id number] Breathing and ambulating normally abd- obese, benign       Assessment & Plan:  Preventative- schedule mammogra PMB- schedule gyn u/s, EMBX (pretreat with cytotec)

## 2014-10-27 NOTE — Progress Notes (Signed)
Has not had a period since 10/08/13 until 10/13/14 when she had a full length 5 day period. Some post coital spotting, but no periods for one year.

## 2014-11-04 ENCOUNTER — Ambulatory Visit (HOSPITAL_COMMUNITY)
Admission: RE | Admit: 2014-11-04 | Discharge: 2014-11-04 | Disposition: A | Discharge status: Home / Self Care | Payer: BLUE CROSS/BLUE SHIELD | Source: Ambulatory Visit | Attending: Obstetrics & Gynecology | Admitting: Obstetrics & Gynecology

## 2014-11-04 DIAGNOSIS — D251 Intramural leiomyoma of uterus: Secondary | ICD-10-CM | POA: Diagnosis not present

## 2014-11-04 DIAGNOSIS — N950 Postmenopausal bleeding: Secondary | ICD-10-CM | POA: Insufficient documentation

## 2014-11-04 DIAGNOSIS — D252 Subserosal leiomyoma of uterus: Secondary | ICD-10-CM | POA: Insufficient documentation

## 2014-11-16 ENCOUNTER — Ambulatory Visit (INDEPENDENT_AMBULATORY_CARE_PROVIDER_SITE_OTHER): Payer: BLUE CROSS/BLUE SHIELD | Admitting: Obstetrics & Gynecology

## 2014-11-16 ENCOUNTER — Other Ambulatory Visit: Payer: Self-pay | Admitting: Obstetrics & Gynecology

## 2014-11-16 ENCOUNTER — Ambulatory Visit
Admission: RE | Admit: 2014-11-16 | Discharge: 2014-11-16 | Disposition: A | Discharge status: Home / Self Care | Payer: BLUE CROSS/BLUE SHIELD | Source: Ambulatory Visit | Attending: Obstetrics & Gynecology | Admitting: Obstetrics & Gynecology

## 2014-11-16 VITALS — BP 137/81 | HR 86 | Ht 68.0 in | Wt 213.0 lb

## 2014-11-16 DIAGNOSIS — N950 Postmenopausal bleeding: Secondary | ICD-10-CM

## 2014-11-16 DIAGNOSIS — Z01812 Encounter for preprocedural laboratory examination: Secondary | ICD-10-CM

## 2014-11-16 DIAGNOSIS — Z0000 Encounter for general adult medical examination without abnormal findings: Secondary | ICD-10-CM

## 2014-11-16 NOTE — Progress Notes (Signed)
   Subjective:    Patient ID: Michele [name], female    DOB: Oct 22, 1965, 49 y.o.   MRN: [id number]  HPI  49 yo lady with PMB. An u/s showed 2 small fibroids, one submucosal. Her endometrium was 50mm. She took cytotec last night. I offered her watchful waiting versus EMBX. She wants the [facility] today.  Review of Systems     Objective:   Physical Exam  UPT negative, consent signed, time out done Cervix prepped with betadine and grasped with a single tooth tenaculum Uterus sounded to 9 cm Pipelle used for 1 pass with a moderate amount of tissue obtained. She tolerated the procedure well.         Assessment & Plan:  PMB- await pathology

## 2014-11-19 ENCOUNTER — Other Ambulatory Visit: Payer: Self-pay | Admitting: Family Medicine

## 2014-11-20 ENCOUNTER — Other Ambulatory Visit: Payer: Self-pay | Admitting: Family Medicine

## 2014-11-20 NOTE — Telephone Encounter (Signed)
Please schedule PE in the fall (or f/u if she does not want PE) and refill until then

## 2014-11-20 NOTE — Telephone Encounter (Signed)
Left voicemail requesting pt to call office back 

## 2014-11-20 NOTE — Telephone Encounter (Signed)
Electronic refill request, pt had an GI issue appt on 10/14/14, and no future appt., lat refilled on 01/06/14 #90 with 3 additional refills, please advise

## 2014-11-23 NOTE — Telephone Encounter (Signed)
appt scheduled and med refilled 

## 2014-12-31 LAB — HM PAP SMEAR

## 2015-02-02 ENCOUNTER — Other Ambulatory Visit: Payer: Self-pay | Admitting: Family Medicine

## 2015-02-17 ENCOUNTER — Telehealth: Payer: Self-pay | Admitting: Family Medicine

## 2015-02-17 DIAGNOSIS — Z0000 Encounter for general adult medical examination without abnormal findings: Secondary | ICD-10-CM

## 2015-02-17 DIAGNOSIS — R739 Hyperglycemia, unspecified: Secondary | ICD-10-CM

## 2015-02-17 NOTE — Telephone Encounter (Signed)
-----   Message from Ellamae Sia sent at 02/11/2015  2:39 PM EDT ----- Regarding: Lab orders for Thursday, 8.18.16 Patient is scheduled for CPX labs, please order future labs, Thanks , Karna Christmas

## 2015-02-18 ENCOUNTER — Other Ambulatory Visit (INDEPENDENT_AMBULATORY_CARE_PROVIDER_SITE_OTHER): Payer: BLUE CROSS/BLUE SHIELD

## 2015-02-18 DIAGNOSIS — R739 Hyperglycemia, unspecified: Secondary | ICD-10-CM

## 2015-02-18 DIAGNOSIS — Z0000 Encounter for general adult medical examination without abnormal findings: Secondary | ICD-10-CM

## 2015-02-18 LAB — COMPREHENSIVE METABOLIC PANEL
ALK PHOS: 102 U/L (ref 39–117)
ALT: 24 U/L (ref 0–35)
AST: 22 U/L (ref 0–37)
Albumin: 3.9 g/dL (ref 3.5–5.2)
BILIRUBIN TOTAL: 0.7 mg/dL (ref 0.2–1.2)
BUN: 22 mg/dL (ref 6–23)
CALCIUM: 9.3 mg/dL (ref 8.4–10.5)
CO2: 30 mEq/L (ref 19–32)
Chloride: 106 mEq/L (ref 96–112)
Creatinine, Ser: 0.78 mg/dL (ref 0.40–1.20)
GFR: 83.04 mL/min (ref >60.00)
GLUCOSE: 102 mg/dL — AB (ref 70–99)
Potassium: 3.7 mEq/L (ref 3.5–5.1)
Sodium: 142 mEq/L (ref 135–145)
TOTAL PROTEIN: 6.6 g/dL (ref 6.0–8.3)

## 2015-02-18 LAB — CBC WITH DIFFERENTIAL/PLATELET
Basophils Absolute: 0 10*3/uL (ref 0.0–0.1)
Basophils Relative: 0.5 % (ref 0.0–3.0)
EOS ABS: 0.2 10*3/uL (ref 0.0–0.7)
Eosinophils Relative: 2.1 % (ref 0.0–5.0)
HCT: 38 % (ref 36.0–46.0)
Hemoglobin: 12.7 g/dL (ref 12.0–15.0)
Lymphocytes Relative: 25.9 % (ref 12.0–46.0)
Lymphs Abs: 1.9 10*3/uL (ref 0.7–4.0)
MCHC: 33.3 g/dL (ref 30.0–36.0)
MCV: 87.8 fl (ref 78.0–100.0)
MONOS PCT: 7.6 % (ref 3.0–12.0)
Monocytes Absolute: 0.5 10*3/uL (ref 0.1–1.0)
Neutro Abs: 4.6 10*3/uL (ref 1.4–7.7)
Neutrophils Relative %: 63.9 % (ref 43.0–77.0)
Platelets: 286 10*3/uL (ref 150.0–400.0)
RBC: 4.33 Mil/uL (ref 3.87–5.11)
RDW: 13.6 % (ref 11.5–15.5)
WBC: 7.2 10*3/uL (ref 4.0–10.5)

## 2015-02-18 LAB — TSH: TSH: 3.98 u[IU]/mL (ref 0.35–4.50)

## 2015-02-18 LAB — LIPID PANEL
CHOL/HDL RATIO: 4
CHOLESTEROL: 180 mg/dL (ref 0–200)
HDL: 46.6 mg/dL (ref >39.00)
LDL CALC: 123 mg/dL — AB (ref 0–99)
NonHDL: 133.59
TRIGLYCERIDES: 54 mg/dL (ref 0.0–149.0)
VLDL: 10.8 mg/dL (ref 0.0–40.0)

## 2015-02-18 LAB — HEMOGLOBIN A1C: Hgb A1c MFr Bld: 5.9 % (ref 4.6–6.5)

## 2015-02-22 ENCOUNTER — Encounter: Payer: BLUE CROSS/BLUE SHIELD | Admitting: Family Medicine

## 2015-03-02 ENCOUNTER — Ambulatory Visit (INDEPENDENT_AMBULATORY_CARE_PROVIDER_SITE_OTHER): Payer: BLUE CROSS/BLUE SHIELD | Admitting: Family Medicine

## 2015-03-02 ENCOUNTER — Encounter: Payer: Self-pay | Admitting: Family Medicine

## 2015-03-02 VITALS — BP 134/78 | HR 75 | Temp 98.6°F | Ht 67.5 in | Wt 210.0 lb

## 2015-03-02 DIAGNOSIS — E785 Hyperlipidemia, unspecified: Secondary | ICD-10-CM | POA: Diagnosis not present

## 2015-03-02 DIAGNOSIS — Z0000 Encounter for general adult medical examination without abnormal findings: Secondary | ICD-10-CM | POA: Diagnosis not present

## 2015-03-02 DIAGNOSIS — Z23 Encounter for immunization: Secondary | ICD-10-CM | POA: Diagnosis not present

## 2015-03-02 DIAGNOSIS — Z86010 Personal history of colonic polyps: Secondary | ICD-10-CM

## 2015-03-02 DIAGNOSIS — R739 Hyperglycemia, unspecified: Secondary | ICD-10-CM | POA: Diagnosis not present

## 2015-03-02 MED ORDER — MONTELUKAST SODIUM 10 MG PO TABS: 10.0000 mg | ORAL_TABLET | Freq: Every day | ORAL | Status: DC

## 2015-03-02 MED ORDER — ATORVASTATIN CALCIUM 20 MG PO TABS: 20.0000 mg | ORAL_TABLET | Freq: Every day | ORAL | Status: DC

## 2015-03-02 MED ORDER — ESOMEPRAZOLE MAGNESIUM 40 MG PO CPDR: 40.0000 mg | DELAYED_RELEASE_CAPSULE | Freq: Every morning | ORAL | Status: DC

## 2015-03-02 MED ORDER — ALBUTEROL SULFATE HFA 108 (90 BASE) MCG/ACT IN AERS: 2.0000 | INHALATION_SPRAY | RESPIRATORY_TRACT | Status: DC | PRN

## 2015-03-02 MED ORDER — FLUOXETINE HCL 40 MG PO CAPS: 40.0000 mg | ORAL_CAPSULE | Freq: Every day | ORAL | Status: DC

## 2015-03-02 NOTE — Assessment & Plan Note (Signed)
Reviewed health habits including diet and exercise and skin cancer prevention Reviewed appropriate screening tests for age  Also reviewed health mt list, fam hx and immunization status , as well as social and family history   See HPI Labs reviewed  Tdap vaccine today Flu vaccine today  Counseled on need for wt loss

## 2015-03-02 NOTE — Progress Notes (Signed)
Pre visit review using our clinic review tool, if applicable. No additional management support is needed unless otherwise documented below in the visit note. 

## 2015-03-02 NOTE — Assessment & Plan Note (Signed)
LDL is up - had missed some lipitor doses Disc goals for lipids and reasons to control them Rev labs with pt Rev low sat fat diet in detail Plans to be more compliant with med and watch diet  Continue to follow

## 2015-03-02 NOTE — Assessment & Plan Note (Signed)
Colonoscopy 2012 (hyperplastic polyps by path) Recall 5 y- even though not adenomas Symptoms have improved

## 2015-03-02 NOTE — Patient Instructions (Signed)
Flu shot today  Tdap vaccine today  Take care of yourself  Try to eat regular meals , with healthy choices for weight loss and diabetes prevention  Also any extra exercise would be helpful

## 2015-03-02 NOTE — Assessment & Plan Note (Signed)
Lab Results  Component Value Date   HGBA1C 5.9 02/18/2015   This is stable and well controlled  Disc low glycemic diet and wt loss to prevent DM

## 2015-03-02 NOTE — Progress Notes (Signed)
Subjective:    Patient ID: Michele [name], female    DOB: [birth year], 49 y.o.   MRN: [id number]  HPI Here for health maintenance exam and to review chronic medical problems    Has been doing ok   Wt is down 3 lb with bmi of 32  (up 10 lb since nov) Working a lot / eating less (no time) - skipping meals  Tries to stick to a healthy diet   HIV screen - not high risk / she declines  Nl result when she was pregnant   Td 4/05-needs that , Tdap   Colonoscopy 2/12 for symptoms - found 2 polyps (5 year recall)   Flu shot 10/15 Wants to get that today   Mammogram 5/16 nl  Self exam - no lumps on self exam   Gyn status  Had pap and breast exam 2 months ago  Still some perimenopausal bleeding - not much  Having hot flashes and night sweats    Hyperglycemia Lab Results  Component Value Date   HGBA1C 5.9 02/18/2015   Down from 6.0 Paying attention to sugar in diet  Staying away from sugar drinks -doing better with that    Hyperlipidemia Lab Results  Component Value Date   CHOL 180 02/18/2015   CHOL 164 02/12/2013   CHOL 250* 03/11/2012   Lab Results  Component Value Date   HDL 46.60 02/18/2015   HDL 52.90 02/12/2013   HDL 44.80 03/11/2012   Lab Results  Component Value Date   LDLCALC 123* 02/18/2015   LDLCALC 102* 02/12/2013   LDLCALC 135* 07/13/2010   Lab Results  Component Value Date   TRIG 54.0 02/18/2015   TRIG 48.0 02/12/2013   TRIG 185.0* 03/11/2012   Lab Results  Component Value Date   CHOLHDL 4 02/18/2015   CHOLHDL 3 02/12/2013   CHOLHDL 6 03/11/2012   Lab Results  Component Value Date   LDLDIRECT 202.8 03/11/2012   LDLDIRECT 198.5 02/04/2008   LDLDIRECT 171.7 10/30/2007   on lipitor and diet HDL is down a bit- no time to exercise with job (though has an active job) Eats a variety of things -not always low fat  Was out of lipitor for a while - back on it now -- LDL is up   Hx of anemia Lab Results  Component Value Date   WBC 7.2  02/18/2015   HGB 12.7 02/18/2015   HCT 38.0 02/18/2015   MCV 87.8 02/18/2015   PLT 286.0 02/18/2015  she has not taken iron in a while - tends to do ok  Not as much vaginal bleeding / just spotting      Chemistry      Component Value Date/Time   NA 142 02/18/2015 0857   NA 140 03/28/2013 1044   K 3.7 02/18/2015 0857   K 4.3 03/28/2013 1044   CL 106 02/18/2015 0857   CO2 30 02/18/2015 0857   CO2 27 03/28/2013 1044   BUN 22 02/18/2015 0857   BUN 8.5 03/28/2013 1044   CREATININE 0.78 02/18/2015 0857   CREATININE 0.8 03/28/2013 1044      Component Value Date/Time   CALCIUM 9.3 02/18/2015 0857   CALCIUM 9.2 03/28/2013 1044   ALKPHOS 102 02/18/2015 0857   AST 22 02/18/2015 0857   ALT 24 02/18/2015 0857   BILITOT 0.7 02/18/2015 0857      Lab Results  Component Value Date   TSH 3.98 02/18/2015     Patient Active Problem List  Diagnosis Date Noted  . Diarrhea 10/13/2014  . Bloating 10/13/2014  . Rectal pressure 10/01/2013  . Rectocele 10/01/2013  . Painful intercourse 10/01/2013  . Abnormal vaginal bleeding 10/01/2013  . Skin tag 04/23/2013  . Stress incontinence in female 04/23/2013  . Other malaise and fatigue 02/12/2013  . Routine gynecological examination 04/01/2012  . Hyperglycemia 04/01/2012  . Low back pain 04/01/2012  . Routine general medical examination at a health care facility 03/11/2012  . GERD 08/26/2010  . ANEMIA 07/19/2010  . FREQUENCY, URINARY 07/06/2010  . MICROSCOPIC HEMATURIA 06/09/2010  . History of colonic polyps 11/15/2007  . IBS 10/08/2007  . ENDOMETRIOSIS 10/08/2007  . Hyperlipidemia 10/30/2006  . Anxiety and depression 10/30/2006  . HEMORRHOIDS, INTERNAL 10/30/2006  . ASTHMA 10/30/2006  . MIGRAINES, HX OF 10/30/2006   Past Medical History  Diagnosis Date  . Anxiety   . Asthma   . HLD (hyperlipidemia)   . Tachycardia     s/p negative cardiac work up with echo  . IBS (irritable bowel syndrome)   . Endometriosis   . Rotator cuff  tear, left 2009  . Syncope   . Iron deficiency anemia 1/12  . Hiatal hernia   . Gastric polyp   . Abnormal Pap smear of cervix     cryotx  . Hemorrhoids   . Uterine fibroid [id number]   Past Surgical History  Procedure Laterality Date  . Hysteroscopy    . Esophagogastroduodenoscopy  1999  . Colonoscopy  1999    small polyps, hemms  . Carpal tunnel release    . Laparoscopy  2006    endometriosis  . Esophagogastroduodenoscopy  2/12    gastric polyp and HH   Social History  Substance Use Topics  . Smoking status: Former Research scientist (life sciences)  . Smokeless tobacco: Never Used  . Alcohol Use: 0.0 oz/week    0 Standard drinks or equivalent per week     Comment: Occasional   Family History  Problem Relation Age of Onset  . Hypertension Father   . Diabetes Father   . Iron deficiency Father     anemia  . Other Father     Barrett's esoph  . Stroke Mother   . Migraines Mother   . Hyperlipidemia Mother   . Diabetes Sister   . Hyperlipidemia Sister    Allergies  Allergen Reactions  . Nsaids Nausea And Vomiting       . Povidone-Iodine Rash       . Propoxyphene N-Acetaminophen Itching        Current Outpatient Prescriptions on File Prior to Visit  Medication Sig Dispense Refill  . albuterol (PROAIR HFA) 108 (90 BASE) MCG/ACT inhaler Inhale 2 puffs into the lungs every 4 (four) hours as needed.      Marland Kitchen atorvastatin (LIPITOR) 20 MG tablet TAKE 1 TABLET DAILY 90 tablet 0  . esomeprazole (NEXIUM) 40 MG capsule TAKE 1 CAPSULE EVERY MORNING 90 capsule 0  . FLUoxetine (PROZAC) 40 MG capsule TAKE 1 CAPSULE DAILY 90 capsule 0  . fluticasone (FLONASE) 50 MCG/ACT nasal spray Place 2 sprays into the nose as needed.     . hydrocortisone (ANUSOL-HC) 2.5 % rectal cream Place 1 application rectally as needed.     . montelukast (SINGULAIR) 10 MG tablet TAKE 1 TABLET AT BEDTIME 90 tablet 0  . POLY-IRON 150 150 MG capsule Take one capsule by mouth three times daily 90 capsule 4  . [DISCONTINUED]  norethindrone-ethinyl estradiol-iron (MICROGESTIN FE1.5/30) 1.5-30 MG-MCG tablet Take 1 tablet by  mouth daily.       No current facility-administered medications on file prior to visit.    Review of Systems Review of Systems  Constitutional: Negative for fever, appetite change,  and unexpected weight change. pos for fatigue from long work hours  Eyes: Negative for pain and visual disturbance.  Respiratory: Negative for cough and shortness of breath.   Cardiovascular: Negative for cp or palpitations    Gastrointestinal: Negative for nausea, diarrhea and constipation.  Genitourinary: Negative for urgency and frequency.  Skin: Negative for pallor or rash   Neurological: Negative for weakness, light-headedness, numbness and headaches.  Hematological: Negative for adenopathy. Does not bruise/bleed easily.  Psychiatric/Behavioral: Negative for dysphoric mood. The patient is not nervous/anxious.          Objective:   Physical Exam  Constitutional: She appears well-developed and well-nourished. No distress.  obese and well appearing   HENT:  Head: Normocephalic and atraumatic.  Right Ear: External ear normal.  Left Ear: External ear normal.  Nose: Nose normal.  Mouth/Throat: Oropharynx is clear and moist.  Eyes: Conjunctivae and EOM are normal. Pupils are equal, round, and reactive to light. Right eye exhibits no discharge. Left eye exhibits no discharge. No scleral icterus.  Neck: Normal range of motion. Neck supple. No JVD present. Carotid bruit is not present. No thyromegaly present.  Cardiovascular: Normal rate, regular rhythm, normal heart sounds and intact distal pulses.  Exam reveals no gallop.   Pulmonary/Chest: Effort normal and breath sounds normal. No respiratory distress. She has no wheezes. She has no rales.  Abdominal: Soft. Bowel sounds are normal. She exhibits no distension and no mass. There is no tenderness.  Musculoskeletal: She exhibits no edema or tenderness.    Lymphadenopathy:    She has no cervical adenopathy.  Neurological: She is alert. She has normal reflexes. No cranial nerve deficit. She exhibits normal muscle tone. Coordination normal.  Skin: Skin is warm and dry. No rash noted. No erythema. No pallor.  Lentigo diffusely  Psychiatric: She has a normal mood and affect.          Assessment & Plan:   Problem List Items Addressed This Visit    History of colonic polyps - Primary    Colonoscopy 2012 (hyperplastic polyps by path) Recall 5 y- even though not adenomas Symptoms have improved         Hyperglycemia    Lab Results  Component Value Date   HGBA1C 5.9 02/18/2015   This is stable and well controlled  Disc low glycemic diet and wt loss to prevent DM      Hyperlipidemia    LDL is up - had missed some lipitor doses Disc goals for lipids and reasons to control them Rev labs with pt Rev low sat fat diet in detail Plans to be more compliant with med and watch diet  Continue to follow      Relevant Medications   atorvastatin (LIPITOR) 20 MG tablet   Routine general medical examination at a health care facility    Reviewed health habits including diet and exercise and skin cancer prevention Reviewed appropriate screening tests for age  Also reviewed health mt list, fam hx and immunization status , as well as social and family history   See HPI Labs reviewed  Tdap vaccine today Flu vaccine today  Counseled on need for wt loss        Other Visit Diagnoses    Need for Tdap vaccination  Relevant Orders    Tdap vaccine greater than or equal to 7yo IM (Completed)    Need for influenza vaccination        Relevant Orders    Flu Vaccine QUAD 36+ mos PF IM (Fluarix & Fluzone Quad PF) (Completed)

## 2015-03-29 ENCOUNTER — Ambulatory Visit (INDEPENDENT_AMBULATORY_CARE_PROVIDER_SITE_OTHER): Payer: BLUE CROSS/BLUE SHIELD | Admitting: Primary Care

## 2015-03-29 ENCOUNTER — Encounter: Payer: Self-pay | Admitting: Primary Care

## 2015-03-29 VITALS — BP 134/80 | HR 77 | Temp 98.0°F | Ht 67.5 in | Wt 211.1 lb

## 2015-03-29 DIAGNOSIS — R0789 Other chest pain: Secondary | ICD-10-CM | POA: Diagnosis not present

## 2015-03-29 NOTE — Patient Instructions (Addendum)
Your ECG looks good. Your heart is functioning well.  Continue Singulair.  Try Flonase nasal spray. This may be purchased over the counter. Instill 2 sprays in each nostril once daily.  You may use 2 puff of your albuterol inhaler now and repeat in 6 hours if you still experience chest tightness.  Please follow up if no improvement in the next week or sooner if you develop fevers, wheezing, increased tightness and shortness of breath.  It was a pleasure meeting you!

## 2015-03-29 NOTE — Progress Notes (Addendum)
Subjective:    Patient ID: Michele [name], female    DOB: 1965-09-07, 49 y.o.   MRN: [id number]  HPI  [name] is a 49 year old female who presents today with a chief complaint of chest tightness. Her tightness is located to the substernal region of her chest. She first noticed the tightness while walking around at work and she also experienced some shortness of breath with left sided forearm pain radiating down to her fingers. She admits to increased stress at work. Denies pain to neck, back, or abdomen, denies nausea. She has a history of asthma, denies wheezing and has not used inhaler recently. Denies cough, reflux of gastric contents, esophageal burning, fevers. She's had some nasal congestion and sneezing. Pain is present upon rest and exertion.   Review of Systems  Constitutional: Negative for fever and chills.  HENT: Positive for congestion and sinus pressure. Negative for ear pain, postnasal drip, sneezing and sore throat.   Respiratory: Positive for chest tightness and shortness of breath. Negative for cough and wheezing.   Cardiovascular: Negative for chest pain.  Neurological: Positive for headaches.       Past Medical History  Diagnosis Date  . Anxiety   . Asthma   . HLD (hyperlipidemia)   . Tachycardia     s/p negative cardiac work up with echo  . IBS (irritable bowel syndrome)   . Endometriosis   . Rotator cuff tear, left 2009  . Syncope   . Iron deficiency anemia 1/12  . Hiatal hernia   . Gastric polyp   . Abnormal Pap smear of cervix     cryotx  . Hemorrhoids   . Uterine fibroid [id number]    Social History   Social History  . Marital Status: Married    Spouse Name: N/A  . Number of Children: N/A  . Years of Education: N/A   Occupational History  . Not on file.   Social History Main Topics  . Smoking status: Former Research scientist (life sciences)  . Smokeless tobacco: Never Used  . Alcohol Use: 0.0 oz/week    0 Standard drinks or equivalent per week     Comment: Occasional    . Drug Use: No  . Sexual Activity:    Partners: Male    Birth Control/ Protection: None   Other Topics Concern  . Not on file   Social History Narrative    Past Surgical History  Procedure Laterality Date  . Hysteroscopy    . Esophagogastroduodenoscopy  1999  . Colonoscopy  1999    small polyps, hemms  . Carpal tunnel release    . Laparoscopy  2006    endometriosis  . Esophagogastroduodenoscopy  2/12    gastric polyp and HH    Family History  Problem Relation Age of Onset  . Hypertension Father   . Diabetes Father   . Iron deficiency Father     anemia  . Other Father     Barrett's esoph  . Stroke Mother   . Migraines Mother   . Hyperlipidemia Mother   . Diabetes Sister   . Hyperlipidemia Sister     Allergies  Allergen Reactions  . Nsaids Nausea And Vomiting       . Povidone-Iodine Rash       . Propoxyphene N-Acetaminophen Itching         Current Outpatient Prescriptions on File Prior to Visit  Medication Sig Dispense Refill  . albuterol (PROAIR HFA) 108 (90 BASE) MCG/ACT inhaler Inhale 2 puffs  into the lungs every 4 (four) hours as needed. 3 Inhaler 3  . atorvastatin (LIPITOR) 20 MG tablet Take 1 tablet (20 mg total) by mouth daily. 90 tablet 3  . esomeprazole (NEXIUM) 40 MG capsule Take 1 capsule (40 mg total) by mouth every morning. 90 capsule 3  . FLUoxetine (PROZAC) 40 MG capsule Take 1 capsule (40 mg total) by mouth daily. 90 capsule 3  . fluticasone (FLONASE) 50 MCG/ACT nasal spray Place 2 sprays into the nose as needed.     . hydrocortisone (ANUSOL-HC) 2.5 % rectal cream Place 1 application rectally as needed.     . montelukast (SINGULAIR) 10 MG tablet Take 1 tablet (10 mg total) by mouth at bedtime. 90 tablet 3  . [DISCONTINUED] norethindrone-ethinyl estradiol-iron (MICROGESTIN FE1.5/30) 1.5-30 MG-MCG tablet Take 1 tablet by mouth daily.       No current facility-administered medications on file prior to visit.    BP 134/80 mmHg  Pulse 77   Temp(Src) 98 F (36.7 C) (Oral)  Ht 5' 7.5" (1.715 m)  Wt 211 lb 1.9 oz (95.763 kg)  BMI 32.56 kg/m2  SpO2 97%  LMP 10/08/2013    Objective:   Physical Exam  Constitutional: She appears well-nourished.  HENT:  Right Ear: Tympanic membrane and ear canal normal.  Left Ear: Tympanic membrane and ear canal normal.  Nose: Nose normal. Right sinus exhibits no maxillary sinus tenderness and no frontal sinus tenderness. Left sinus exhibits no maxillary sinus tenderness and no frontal sinus tenderness.  Mouth/Throat: Oropharynx is clear and moist.  Eyes: Conjunctivae are normal. Pupils are equal, round, and reactive to light.  Neck: Neck supple.  Cardiovascular: Normal rate and regular rhythm.   Pulmonary/Chest: Effort normal and breath sounds normal. She has no wheezes.  Skin: Skin is warm and dry.          Assessment & Plan:  Chest tightness:  Present substernally since this AM while walking at work. History of asthma and seasonal allergies. Recent stress at work. No cough, chest pain. + nasal congestion and sinus pressure. Lungs clear, sinus tenderness, heart rate and rhythm WNL.  Suspect season allergy involvement as ECG was WNL. Treat with Flonase, continue singulair, use albuterol now for chest tightness. Follow up if development of fevers, fatigue, wheezing  ECG: NSR, rate of 66, no ST elevation or depression. No prior evidence of cardiac event. No BBB. Normal ECG.

## 2015-03-29 NOTE — Progress Notes (Signed)
Pre visit review using our clinic review tool, if applicable. No additional management support is needed unless otherwise documented below in the visit note. 

## 2015-04-14 ENCOUNTER — Other Ambulatory Visit: Payer: Self-pay | Admitting: Family Medicine

## 2015-06-05 ENCOUNTER — Emergency Department (HOSPITAL_COMMUNITY)
Admission: EM | Admit: 2015-06-05 | Discharge: 2015-06-05 | Disposition: A | Discharge status: Home / Self Care | Payer: BLUE CROSS/BLUE SHIELD | Attending: Emergency Medicine | Admitting: Emergency Medicine

## 2015-06-05 ENCOUNTER — Emergency Department (HOSPITAL_COMMUNITY): Payer: BLUE CROSS/BLUE SHIELD

## 2015-06-05 ENCOUNTER — Encounter (HOSPITAL_COMMUNITY): Payer: Self-pay | Admitting: Emergency Medicine

## 2015-06-05 DIAGNOSIS — S62667A Nondisplaced fracture of distal phalanx of left little finger, initial encounter for closed fracture: Secondary | ICD-10-CM | POA: Insufficient documentation

## 2015-06-05 DIAGNOSIS — Z79899 Other long term (current) drug therapy: Secondary | ICD-10-CM | POA: Diagnosis not present

## 2015-06-05 DIAGNOSIS — Z8742 Personal history of other diseases of the female genital tract: Secondary | ICD-10-CM | POA: Diagnosis not present

## 2015-06-05 DIAGNOSIS — Z86018 Personal history of other benign neoplasm: Secondary | ICD-10-CM | POA: Insufficient documentation

## 2015-06-05 DIAGNOSIS — W231XXA Caught, crushed, jammed, or pinched between stationary objects, initial encounter: Secondary | ICD-10-CM | POA: Insufficient documentation

## 2015-06-05 DIAGNOSIS — Y9289 Other specified places as the place of occurrence of the external cause: Secondary | ICD-10-CM | POA: Diagnosis not present

## 2015-06-05 DIAGNOSIS — Z7951 Long term (current) use of inhaled steroids: Secondary | ICD-10-CM | POA: Insufficient documentation

## 2015-06-05 DIAGNOSIS — J45909 Unspecified asthma, uncomplicated: Secondary | ICD-10-CM | POA: Insufficient documentation

## 2015-06-05 DIAGNOSIS — Z8739 Personal history of other diseases of the musculoskeletal system and connective tissue: Secondary | ICD-10-CM | POA: Insufficient documentation

## 2015-06-05 DIAGNOSIS — Z862 Personal history of diseases of the blood and blood-forming organs and certain disorders involving the immune mechanism: Secondary | ICD-10-CM | POA: Diagnosis not present

## 2015-06-05 DIAGNOSIS — Y990 Civilian activity done for income or pay: Secondary | ICD-10-CM | POA: Insufficient documentation

## 2015-06-05 DIAGNOSIS — F419 Anxiety disorder, unspecified: Secondary | ICD-10-CM | POA: Diagnosis not present

## 2015-06-05 DIAGNOSIS — Z87891 Personal history of nicotine dependence: Secondary | ICD-10-CM | POA: Insufficient documentation

## 2015-06-05 DIAGNOSIS — Z8719 Personal history of other diseases of the digestive system: Secondary | ICD-10-CM | POA: Insufficient documentation

## 2015-06-05 DIAGNOSIS — E785 Hyperlipidemia, unspecified: Secondary | ICD-10-CM | POA: Diagnosis not present

## 2015-06-05 DIAGNOSIS — S6992XA Unspecified injury of left wrist, hand and finger(s), initial encounter: Secondary | ICD-10-CM | POA: Diagnosis present

## 2015-06-05 DIAGNOSIS — S62609A Fracture of unspecified phalanx of unspecified finger, initial encounter for closed fracture: Secondary | ICD-10-CM

## 2015-06-05 DIAGNOSIS — Y9389 Activity, other specified: Secondary | ICD-10-CM | POA: Insufficient documentation

## 2015-06-05 NOTE — ED Notes (Signed)
Pt c/o left pinky injury from trying to move a grill while at work and her finger got stuck. She reports he heard a pop. She has a finger splint in place.

## 2015-06-05 NOTE — ED Provider Notes (Signed)
CSN: [id number]     Arrival date & time 06/05/15  1935 History  By signing my name below, I, [name], attest that this documentation has been prepared under the direction and in the presence of Marcene Brawn, PA-C. Electronically Signed: [name] ED Scribe. 06/05/2015. 8:34 PM.    Chief Complaint  Patient presents with  . Finger Injury   The history is provided by the patient. No language interpreter was used.   HPI Comments: Michele [name] is a 49 y.o. female who presents to the Emergency Department complaining of constant, 3/10 left hand pinky pain s/p an injury sustained at work [employer].  The patient reports she repositioned a cart and unknowingly had her pink wedged behind a piece of metal when she turned away and heard a pop.  She has splinted the injury but used no medications.  splint  Past Medical History  Diagnosis Date  . Anxiety   . Asthma   . HLD (hyperlipidemia)   . Tachycardia     s/p negative cardiac work up with echo  . IBS (irritable bowel syndrome)   . Endometriosis   . Rotator cuff tear, left 2009  . Syncope   . Iron deficiency anemia 1/12  . Hiatal hernia   . Gastric polyp   . Abnormal Pap smear of cervix     cryotx  . Hemorrhoids   . Uterine fibroid [id number]   Past Surgical History  Procedure Laterality Date  . Hysteroscopy    . Esophagogastroduodenoscopy  1999  . Colonoscopy  1999    small polyps, hemms  . Carpal tunnel release    . Laparoscopy  2006    endometriosis  . Esophagogastroduodenoscopy  2/12    gastric polyp and HH   Family History  Problem Relation Age of Onset  . Hypertension Father   . Diabetes Father   . Iron deficiency Father     anemia  . Other Father     Barrett's esoph  . Stroke Mother   . Migraines Mother   . Hyperlipidemia Mother   . Diabetes Sister   . Hyperlipidemia Sister    Social History  Substance Use Topics  . Smoking status: Former Research scientist (life sciences)  . Smokeless tobacco: Never Used  . Alcohol Use: 0.0 oz/week    0  Standard drinks or equivalent per week     Comment: Occasional   OB History    Gravida Para Term Preterm AB TAB SAB Ectopic Multiple Living   1 1 1       1      Review of Systems A complete 10 system review of systems was obtained and all systems are negative except as noted in the HPI and PMH.   Allergies  Nsaids; Povidone-iodine; and Propoxyphene n-acetaminophen  Home Medications   Prior to Admission medications   Medication Sig Start Date End Date Taking? Authorizing Provider  albuterol (PROAIR HFA) 108 (90 BASE) MCG/ACT inhaler Inhale 2 puffs into the lungs every 4 (four) hours as needed. 03/02/15   Abner Greenspan, MD  atorvastatin (LIPITOR) 20 MG tablet Take 1 tablet (20 mg total) by mouth daily. 03/02/15   Abner Greenspan, MD  esomeprazole (NEXIUM) 40 MG capsule Take 1 capsule (40 mg total) by mouth every morning. 03/02/15   Abner Greenspan, MD  FLUoxetine (PROZAC) 40 MG capsule Take 1 capsule (40 mg total) by mouth daily. 03/02/15   Abner Greenspan, MD  fluticasone (FLONASE) 50 MCG/ACT nasal spray Place 2 sprays into  the nose as needed.     Historical Provider, MD  hydrocortisone (ANUSOL-HC) 2.5 % rectal cream Place 1 application rectally as needed.     Historical Provider, MD  montelukast (SINGULAIR) 10 MG tablet Take 1 tablet (10 mg total) by mouth at bedtime. 03/02/15   Wynelle Fanny Tower, MD   BP 142/92 mmHg  Pulse 83  Temp(Src) 97.9 F (36.6 C) (Oral)  Resp 18  SpO2 98%  LMP 10/08/2013 Physical Exam  Constitutional: She is oriented to person, place, and time. She appears well-developed and well-nourished. No distress.  HENT:  Head: Normocephalic and atraumatic.  Eyes: Conjunctivae and EOM are normal.  Neck: Neck supple. No tracheal deviation present.  Cardiovascular: Normal rate.   Pulmonary/Chest: Effort normal. No respiratory distress.  Musculoskeletal: Normal range of motion.  Left hand pinky finger: swelling distal tip.  Good sensation.  Good cap refill.    Neurological: She  is alert and oriented to person, place, and time.  Skin: Skin is warm and dry.  Psychiatric: She has a normal mood and affect. Her behavior is normal.  Nursing note and vitals reviewed.   ED Course  Procedures (including critical care time)  DIAGNOSTIC STUDIES: Oxygen Saturation is 98% on RA, normal by my interpretation.    COORDINATION OF CARE:  8:34 PM Informed patient that imaging was positive for fx.  Will splint injury.  Patient advised to RICE.   Labs Review Labs Reviewed - No data to display  Imaging Review Dg Finger Little Left  06/05/2015  CLINICAL DATA:  Left little finger pain after hearing a popping sound when trying to move a grill at work. EXAM: LEFT LITTLE FINGER 2+V COMPARISON:  Left hand dated 04/25/2005. FINDINGS: Transverse fracture of the midportion of the fifth distal phalanx without significant displacement. Minimal ventral angulation of the distal fragment. IMPRESSION: Essentially nondisplaced fracture of the fifth distal phalanx. Electronically Signed   By: Claudie Revering M.D.   On: 06/05/2015 20:25   I have personally reviewed and evaluated these images and lab results as part of my medical decision-making.   EKG Interpretation None      MDM   Final diagnoses:  Fracture, finger, closed, initial encounter    Splint Ice Ibuprofen Follow up with Dr. Lenon Curt Orthopaedist for evaluation in 1 week   I personally performed the services in this documentation, which was scribed in my presence.  The recorded information has been reviewed and considered.   Ronnald Collum.   Hollace Kinnier Cedar, PA-C 06/06/15 [place], MD 06/12/15 [phone]

## 2015-06-05 NOTE — Discharge Instructions (Signed)
Finger Fracture  Fractures of fingers are breaks in the bones of the fingers. There are many types of fractures. There are different ways of treating these fractures. Your health care provider will discuss the best way to treat your fracture.  CAUSES  Traumatic injury is the main cause of broken fingers. These include:  · Injuries while playing sports.  · Workplace injuries.  · Falls.  RISK FACTORS  Activities that can increase your risk of finger fractures include:  · Sports.  · Workplace activities that involve machinery.  · A condition called osteoporosis, which can make your bones less dense and cause them to fracture more easily.  SIGNS AND SYMPTOMS  The main symptoms of a broken finger are pain and swelling within 15 minutes after the injury. Other symptoms include:  · Bruising of your finger.  · Stiffness of your finger.  · Numbness of your finger.  · Exposed bones (compound fracture) if the fracture is severe.  DIAGNOSIS   The best way to diagnose a broken bone is with X-ray imaging. Additionally, your health care provider will use this X-ray image to evaluate the position of the broken finger bones.   TREATMENT   Finger fractures can be treated with:   · Nonreduction--This means the bones are in place. The finger is splinted without changing the positions of the bone pieces. The splint is usually left on for about a week to 10 days. This will depend on your fracture and what your health care provider thinks.  · Closed reduction--The bones are put back into position without using surgery. The finger is then splinted.  · Open reduction and internal fixation--The fracture site is opened. Then the bone pieces are fixed into place with pins or some type of hardware. This is seldom required. It depends on the severity of the fracture.  HOME CARE INSTRUCTIONS   · Follow your health care provider's instructions regarding activities, exercises, and physical therapy.  · Only take over-the-counter or prescription  medicines for pain, discomfort, or fever as directed by your health care provider.  SEEK MEDICAL CARE IF:  You have pain or swelling that limits the motion or use of your fingers.  SEEK IMMEDIATE MEDICAL CARE IF:   Your finger becomes numb.  MAKE SURE YOU:   · Understand these instructions.  · Will watch your condition.  · Will get help right away if you are not doing well or get worse.     This information is not intended to replace advice given to you by your health care provider. Make sure you discuss any questions you have with your health care provider.     Document Released: 10/01/2000 Document Revised: 04/09/2013 Document Reviewed: 01/29/2013  Elsevier Interactive Patient Education ©2016 Elsevier Inc.

## 2015-07-14 ENCOUNTER — Ambulatory Visit (INDEPENDENT_AMBULATORY_CARE_PROVIDER_SITE_OTHER): Payer: BLUE CROSS/BLUE SHIELD | Admitting: Family Medicine

## 2015-07-14 ENCOUNTER — Encounter: Payer: Self-pay | Admitting: Family Medicine

## 2015-07-14 VITALS — BP 136/88 | HR 89 | Temp 98.4°F | Ht 67.5 in | Wt 217.0 lb

## 2015-07-14 DIAGNOSIS — R202 Paresthesia of skin: Secondary | ICD-10-CM | POA: Diagnosis not present

## 2015-07-14 MED ORDER — PREDNISONE 10 MG PO TABS: ORAL_TABLET | ORAL | Status: DC

## 2015-07-14 NOTE — Progress Notes (Signed)
Subjective:    Patient ID: Michele [name], female    DOB: Mar 19, 1966, 50 y.o.   MRN: [id number]  HPI Here with R hand and arm pain /tingling --2 weeks  Thinks it starts at the hand and travels up   Has had carpal tunnel release in the past - no problems since (both hands)  Affecting her grip and she drops things No weakness   No particular triggers Can happen at rest or activity No particular position brings it on   Episodes last 1-2 minutes  She rubs arm until it stops tingling   She had a sore area on neck - a while back/ now it is improved   She has been on a work release- broke the tip of L 5th finger and nail loss (work accident)  Already takes 2 aleve per day   Patient Active Problem List   Diagnosis Date Noted  . Diarrhea 10/13/2014  . Rectal pressure 10/01/2013  . Rectocele 10/01/2013  . Painful intercourse 10/01/2013  . Skin tag 04/23/2013  . Stress incontinence in female 04/23/2013  . Routine gynecological examination 04/01/2012  . Hyperglycemia 04/01/2012  . Routine general medical examination at a health care facility 03/11/2012  . GERD 08/26/2010  . History of anemia 07/19/2010  . FREQUENCY, URINARY 07/06/2010  . MICROSCOPIC HEMATURIA 06/09/2010  . History of colonic polyps 11/15/2007  . IBS 10/08/2007  . ENDOMETRIOSIS 10/08/2007  . Hyperlipidemia 10/30/2006  . Anxiety and depression 10/30/2006  . HEMORRHOIDS, INTERNAL 10/30/2006  . ASTHMA 10/30/2006  . MIGRAINES, HX OF 10/30/2006   Past Medical History  Diagnosis Date  . Anxiety   . Asthma   . HLD (hyperlipidemia)   . Tachycardia     s/p negative cardiac work up with echo  . IBS (irritable bowel syndrome)   . Endometriosis   . Rotator cuff tear, left 2009  . Syncope   . Iron deficiency anemia 1/12  . Hiatal hernia   . Gastric polyp   . Abnormal Pap smear of cervix     cryotx  . Hemorrhoids   . Uterine fibroid [id number]   Past Surgical History  Procedure Laterality Date  . Hysteroscopy     . Esophagogastroduodenoscopy  1999  . Colonoscopy  1999    small polyps, hemms  . Carpal tunnel release    . Laparoscopy  2006    endometriosis  . Esophagogastroduodenoscopy  2/12    gastric polyp and HH   Social History  Substance Use Topics  . Smoking status: Former Research scientist (life sciences)  . Smokeless tobacco: Never Used  . Alcohol Use: 0.0 oz/week    0 Standard drinks or equivalent per week     Comment: Occasional   Family History  Problem Relation Age of Onset  . Hypertension Father   . Diabetes Father   . Iron deficiency Father     anemia  . Other Father     Barrett's esoph  . Stroke Mother   . Migraines Mother   . Hyperlipidemia Mother   . Diabetes Sister   . Hyperlipidemia Sister    Allergies  Allergen Reactions  . Nsaids Nausea And Vomiting       . Povidone-Iodine Rash       . Propoxyphene N-Acetaminophen Itching        Current Outpatient Prescriptions on File Prior to Visit  Medication Sig Dispense Refill  . albuterol (PROAIR HFA) 108 (90 BASE) MCG/ACT inhaler Inhale 2 puffs into the lungs every 4 (four) hours as  needed. 3 Inhaler 3  . atorvastatin (LIPITOR) 20 MG tablet Take 1 tablet (20 mg total) by mouth daily. 90 tablet 3  . esomeprazole (NEXIUM) 40 MG capsule Take 1 capsule (40 mg total) by mouth every morning. 90 capsule 3  . FLUoxetine (PROZAC) 40 MG capsule Take 1 capsule (40 mg total) by mouth daily. 90 capsule 3  . fluticasone (FLONASE) 50 MCG/ACT nasal spray Place 2 sprays into the nose as needed.     . hydrocortisone (ANUSOL-HC) 2.5 % rectal cream Place 1 application rectally as needed.     . montelukast (SINGULAIR) 10 MG tablet Take 1 tablet (10 mg total) by mouth at bedtime. 90 tablet 3  . [DISCONTINUED] norethindrone-ethinyl estradiol-iron (MICROGESTIN FE1.5/30) 1.5-30 MG-MCG tablet Take 1 tablet by mouth daily.       No current facility-administered medications on file prior to visit.     Review of Systems Review of Systems  Constitutional:  Negative for fever, appetite change, fatigue and unexpected weight change.  Eyes: Negative for pain and visual disturbance.  Respiratory: Negative for cough and shortness of breath.   Cardiovascular: Negative for cp or palpitations    Gastrointestinal: Negative for nausea, diarrhea and constipation.  Genitourinary: Negative for urgency and frequency.  Skin: Negative for pallor or rash   Neurological: Negative for weakness, light-headedness, numbness and headaches. Pos for tingling of R arm Hematological: Negative for adenopathy. Does not bruise/bleed easily.  Psychiatric/Behavioral: Negative for dysphoric mood. The patient is not nervous/anxious.         Objective:   Physical Exam  Constitutional: She appears well-developed and well-nourished. No distress.  obese and well appearing   HENT:  Head: Normocephalic and atraumatic.  Eyes: Conjunctivae and EOM are normal. Pupils are equal, round, and reactive to light.  Neck: Normal range of motion. Neck supple. No JVD present. No thyromegaly present.  No cervical tenderness Nl rom  Cardiovascular: Normal rate and regular rhythm.   Pulmonary/Chest: Effort normal and breath sounds normal.  Musculoskeletal: She exhibits tenderness. She exhibits no edema.  Nl rom R wrist w/o tenderness  Nl rom R elbow with some medial epicondyle tenderness No swelling   Mild tenderness of bicep tendon proximally Nl rom shoulder Neg Hawking and Neer tests   No cervical tenderness    Lymphadenopathy:    She has no cervical adenopathy.  Neurological: She is alert. She has normal strength and normal reflexes. She displays no atrophy and no tremor. No cranial nerve deficit. She exhibits normal muscle tone. Coordination and gait normal.  slt dec to soft touch sensation in R fingers but not palm  Nl grip bilaterally  Neg tinel and phalen tests   Skin: Skin is warm and dry. No rash noted. No erythema. No pallor.  Psychiatric: She has a normal mood and  affect.          Assessment & Plan:   Problem List Items Addressed This Visit      Other   Arm paresthesia, right - Primary    Suspect radial nerve impingement  Poss at elbow or neck Already on aleve-will try course of prednisone   I think you may have a radial nerve impingement - possibly at the elbow (cannot rule out shoulder or neck) Take prednisone as directed with food  Use ice pack on elbow for 10 minutes several times daily  Try to avoid overworking elbow  Update if not starting to improve in a week or if worsening  - would consider neurology  eval for nerve testin

## 2015-07-14 NOTE — Progress Notes (Signed)
Pre visit review using our clinic review tool, if applicable. No additional management support is needed unless otherwise documented below in the visit note. 

## 2015-07-14 NOTE — Patient Instructions (Signed)
I think you may have a radial nerve impingement - possibly at the elbow (cannot rule out shoulder or neck) Take prednisone as directed with food  Use ice pack on elbow for 10 minutes several times daily  Try to avoid overworking elbow  Update if not starting to improve in a week or if worsening  - would consider neurology eval for nerve testing

## 2015-07-15 NOTE — Assessment & Plan Note (Signed)
Suspect radial nerve impingement  Poss at elbow or neck Already on aleve-will try course of prednisone   I think you may have a radial nerve impingement - possibly at the elbow (cannot rule out shoulder or neck) Take prednisone as directed with food  Use ice pack on elbow for 10 minutes several times daily  Try to avoid overworking elbow  Update if not starting to improve in a week or if worsening  - would consider neurology eval for nerve testin

## 2015-07-26 ENCOUNTER — Telehealth: Payer: Self-pay | Admitting: Family Medicine

## 2015-07-26 DIAGNOSIS — R202 Paresthesia of skin: Secondary | ICD-10-CM

## 2015-07-26 NOTE — Telephone Encounter (Signed)
Pt called stating she has taken all the medication and she stated it is not any better She would like a referral  She would like to go to [facility]

## 2015-07-26 NOTE — Telephone Encounter (Signed)
Referring to neuro Will route to ref coordinator

## 2015-07-27 NOTE — Telephone Encounter (Signed)
Patient given info on [facility] and placed on WQ.

## 2015-08-16 ENCOUNTER — Ambulatory Visit (INDEPENDENT_AMBULATORY_CARE_PROVIDER_SITE_OTHER): Payer: BLUE CROSS/BLUE SHIELD | Admitting: Internal Medicine

## 2015-08-16 ENCOUNTER — Encounter: Payer: Self-pay | Admitting: Internal Medicine

## 2015-08-16 VITALS — BP 130/82 | HR 82 | Temp 98.4°F | Wt 213.5 lb

## 2015-08-16 DIAGNOSIS — R030 Elevated blood-pressure reading, without diagnosis of hypertension: Secondary | ICD-10-CM

## 2015-08-16 DIAGNOSIS — J4520 Mild intermittent asthma, uncomplicated: Secondary | ICD-10-CM

## 2015-08-16 DIAGNOSIS — IMO0001 Reserved for inherently not codable concepts without codable children: Secondary | ICD-10-CM

## 2015-08-16 MED ORDER — FLUOXETINE HCL 40 MG PO CAPS: 40.0000 mg | ORAL_CAPSULE | Freq: Every day | ORAL | Status: DC

## 2015-08-16 MED ORDER — FLUTICASONE PROPIONATE 50 MCG/ACT NA SUSP: 2.0000 | NASAL | Status: DC | PRN

## 2015-08-16 MED ORDER — ALBUTEROL SULFATE HFA 108 (90 BASE) MCG/ACT IN AERS: 2.0000 | INHALATION_SPRAY | RESPIRATORY_TRACT | Status: DC | PRN

## 2015-08-16 MED ORDER — MONTELUKAST SODIUM 10 MG PO TABS: 10.0000 mg | ORAL_TABLET | Freq: Every day | ORAL | Status: DC

## 2015-08-16 MED ORDER — ESOMEPRAZOLE MAGNESIUM 40 MG PO CPDR: 40.0000 mg | DELAYED_RELEASE_CAPSULE | Freq: Every morning | ORAL | Status: DC

## 2015-08-16 MED ORDER — ATORVASTATIN CALCIUM 20 MG PO TABS: 20.0000 mg | ORAL_TABLET | Freq: Every day | ORAL | Status: DC

## 2015-08-16 NOTE — Patient Instructions (Signed)
Allergies °An allergy is when your body reacts to a substance in a way that is not normal. An allergic reaction can happen after you: °· Eat something. °· Breathe in something. °· Touch something. °WHAT KINDS OF ALLERGIES ARE THERE? °You can be allergic to: °· Things that are only around during certain seasons, like molds and pollens. °· Foods. °· Drugs. °· Insects. °· Animal dander. °WHAT ARE SYMPTOMS OF ALLERGIES? °· Puffiness (swelling). This may happen on the lips, face, tongue, mouth, or throat. °· Sneezing. °· Coughing. °· Breathing loudly (wheezing). °· Stuffy nose. °· Tingling in the mouth. °· A rash. °· Itching. °· Itchy, red, puffy areas of skin (hives). °· Watery eyes. °· Throwing up (vomiting). °· Watery poop (diarrhea). °· Dizziness. °· Feeling faint or fainting. °· Trouble breathing or swallowing. °· A tight feeling in the chest. °· A fast heartbeat. °HOW ARE ALLERGIES DIAGNOSED? °Allergies can be diagnosed with: °· A medical and family history. °· Skin tests. °· Blood tests. °· A food diary. A food diary is a record of all the foods, drinks, and symptoms you have each day. °· The results of an elimination diet. This diet involves making sure not to eat certain foods and then seeing what happens when you start eating them again. °HOW ARE ALLERGIES TREATED? °There is no cure for allergies, but allergic reactions can be treated with medicine. Severe reactions usually need to be treated at a hospital.  °HOW CAN REACTIONS BE PREVENTED? °The best way to prevent an allergic reaction is to avoid the thing you are allergic to. Allergy shots and medicines can also help prevent reactions in some cases. °  °This information is not intended to replace advice given to you by your health care provider. Make sure you discuss any questions you have with your health care provider. °  °Document Released: 10/14/2012 Document Revised: 07/10/2014 Document Reviewed: 03/31/2014 °Elsevier Interactive Patient Education ©2016  Elsevier Inc. ° °

## 2015-08-16 NOTE — Progress Notes (Signed)
Pre visit review using our clinic review tool, if applicable. No additional management support is needed unless otherwise documented below in the visit note. 

## 2015-08-16 NOTE — Progress Notes (Signed)
Subjective:    Patient ID: Michele [name], female    DOB: 1965/11/05, 50 y.o.   MRN: [id number]  HPI  Pt presents to the clinic today with c/o elevated blood pressure. She reports she went to [facility] on Friday for a foot injury and was told her blood pressure was elevated. Her BP today is 130/82. She denies headache, dizziness, cheat pain or shortness of breath. She has never been treated for HTN in the past.  Pt also reports that she had an asthma attack Saturday night. She used her Albuterol inhaler with good relief. She still c/o chest tightness but denies chest pain or shortness of breath. She denies cough. She has been off her Singulair for about a month.   Review of Systems      Past Medical History  Diagnosis Date  . Anxiety   . Asthma   . HLD (hyperlipidemia)   . Tachycardia     s/p negative cardiac work up with echo  . IBS (irritable bowel syndrome)   . Endometriosis   . Rotator cuff tear, left 2009  . Syncope   . Iron deficiency anemia 1/12  . Hiatal hernia   . Gastric polyp   . Abnormal Pap smear of cervix     cryotx  . Hemorrhoids   . Uterine fibroid [id number]    Current Outpatient Prescriptions  Medication Sig Dispense Refill  . albuterol (PROAIR HFA) 108 (90 BASE) MCG/ACT inhaler Inhale 2 puffs into the lungs every 4 (four) hours as needed. 3 Inhaler 3  . atorvastatin (LIPITOR) 20 MG tablet Take 1 tablet (20 mg total) by mouth daily. 90 tablet 3  . esomeprazole (NEXIUM) 40 MG capsule Take 1 capsule (40 mg total) by mouth every morning. 90 capsule 3  . FLUoxetine (PROZAC) 40 MG capsule Take 1 capsule (40 mg total) by mouth daily. 90 capsule 3  . fluticasone (FLONASE) 50 MCG/ACT nasal spray Place 2 sprays into the nose as needed.     . hydrocortisone (ANUSOL-HC) 2.5 % rectal cream Place 1 application rectally as needed.     . montelukast (SINGULAIR) 10 MG tablet Take 1 tablet (10 mg total) by mouth at bedtime. 90 tablet 3  . [DISCONTINUED] norethindrone-ethinyl  estradiol-iron (MICROGESTIN FE1.5/30) 1.5-30 MG-MCG tablet Take 1 tablet by mouth daily.       No current facility-administered medications for this visit.    Allergies  Allergen Reactions  . Hydrocodone-Acetaminophen Itching  . Nsaids Nausea And Vomiting       . Povidone-Iodine Rash       . Propoxyphene N-Acetaminophen Itching         Family History  Problem Relation Age of Onset  . Hypertension Father   . Diabetes Father   . Iron deficiency Father     anemia  . Other Father     Barrett's esoph  . Stroke Mother   . Migraines Mother   . Hyperlipidemia Mother   . Diabetes Sister   . Hyperlipidemia Sister     Social History   Social History  . Marital Status: Married    Spouse Name: N/A  . Number of Children: N/A  . Years of Education: N/A   Occupational History  . Not on file.   Social History Main Topics  . Smoking status: Former Research scientist (life sciences)  . Smokeless tobacco: Never Used  . Alcohol Use: 0.0 oz/week    0 Standard drinks or equivalent per week     Comment: Occasional  . Drug Use:  No  . Sexual Activity:    Partners: Male    Birth Control/ Protection: None   Other Topics Concern  . Not on file   Social History Narrative     Constitutional: Denies fever, malaise, fatigue, headache or abrupt weight changes.  HEENT: Denies eye pain, eye redness, ear pain, ringing in the ears, wax buildup, runny nose, nasal congestion, bloody nose, or sore throat. Respiratory: Denies difficulty breathing, shortness of breath, cough or sputum production.   Cardiovascular: Denies chest pain, chest tightness, palpitations or swelling in the hands or feet.   No other specific complaints in a complete review of systems (except as listed in HPI above).  Objective:   Physical Exam   BP 130/82 mmHg  Pulse 82  Temp(Src) 98.4 F (36.9 C) (Oral)  Wt 213 lb 8 oz (96.843 kg)  SpO2 97%  LMP 10/08/2013 Wt Readings from Last 3 Encounters:  08/16/15 213 lb 8 oz (96.843 kg)    07/14/15 217 lb (98.431 kg)  03/29/15 211 lb 1.9 oz (95.763 kg)    General: Appears her stated age, obese in NAD. HEENT: Head: normal shape and size; Throat/Mouth: Teeth present, mucosa pink and moist, no exudate, lesions or ulcerations noted.  Neck:  No adenopathy noted.  Cardiovascular: Normal rate and rhythm. S1,S2 noted.  No murmur, rubs or gallops noted.  Pulmonary/Chest: Normal effort and positive vesicular breath sounds. No respiratory distress. No wheezes, rales or ronchi noted.  Neurological: Alert and oriented.    BMET    Component Value Date/Time   NA 142 02/18/2015 0857   NA 140 03/28/2013 1044   K 3.7 02/18/2015 0857   K 4.3 03/28/2013 1044   CL 106 02/18/2015 0857   CO2 30 02/18/2015 0857   CO2 27 03/28/2013 1044   GLUCOSE 102* 02/18/2015 0857   GLUCOSE 123 03/28/2013 1044   BUN 22 02/18/2015 0857   BUN 8.5 03/28/2013 1044   CREATININE 0.78 02/18/2015 0857   CREATININE 0.8 03/28/2013 1044   CALCIUM 9.3 02/18/2015 0857   CALCIUM 9.2 03/28/2013 1044   GFRNONAA 84 10/30/2007 0000   GFRAA 101 10/30/2007 0000    Lipid Panel     Component Value Date/Time   CHOL 180 02/18/2015 0857   TRIG 54.0 02/18/2015 0857   HDL 46.60 02/18/2015 0857   CHOLHDL 4 02/18/2015 0857   VLDL 10.8 02/18/2015 0857   LDLCALC 123* 02/18/2015 0857    CBC    Component Value Date/Time   WBC 7.2 02/18/2015 0857   WBC 5.6 03/28/2013 1043   RBC 4.33 02/18/2015 0857   RBC 4.43 03/28/2013 1043   RBC 5.24* 10/24/2010 1543   HGB 12.7 02/18/2015 0857   HGB 13.5 03/28/2013 1043   HCT 38.0 02/18/2015 0857   HCT 39.7 03/28/2013 1043   PLT 286.0 02/18/2015 0857   PLT 249 03/28/2013 1043   MCV 87.8 02/18/2015 0857   MCV 89.5 03/28/2013 1043   MCH 30.5 03/28/2013 1043   MCHC 33.3 02/18/2015 0857   MCHC 34.0 03/28/2013 1043   RDW 13.6 02/18/2015 0857   RDW 12.9 03/28/2013 1043   LYMPHSABS 1.9 02/18/2015 0857   LYMPHSABS 1.6 03/28/2013 1043   MONOABS 0.5 02/18/2015 0857   MONOABS  0.5 03/28/2013 1043   EOSABS 0.2 02/18/2015 0857   EOSABS 0.3 03/28/2013 1043   BASOSABS 0.0 02/18/2015 0857   BASOSABS 0.0 03/28/2013 1043    Hgb A1C Lab Results  Component Value Date   HGBA1C 5.9 02/18/2015  Assessment & Plan:   Asthma, mild persistent:  Continue Albuterol prn Refilled Singulair today  Elevated bp:  BP fine today Work on exercise to lose weight DASH diet  RTC as needed or if symptoms persist or worsen

## 2015-08-17 ENCOUNTER — Telehealth: Payer: Self-pay | Admitting: Family Medicine

## 2015-08-17 NOTE — Telephone Encounter (Signed)
Pt called employeer will be faxing fmla  Pt stated it was for Pt started crying make appointmnet with dr tower 2/15 sent to [facility]

## 2015-08-17 NOTE — Telephone Encounter (Signed)
Keep appointment for tomorrow as planned

## 2015-08-17 NOTE — Telephone Encounter (Signed)
Patient Name: Michele [name]  DOB: May 30, 1966    Initial Comment caller states she had a close family friend pass away and she is having stress at work, very emotional, crying and upset   Nurse Assessment  Nurse: Mallie Mussel, RN, Alveta Heimlich Date/Time (Uncertain Time): 08/17/2015 10:21:11 AM  Confirm and document reason for call. If symptomatic, describe symptoms. You must click the next button to save text entered. ---Caller states that her mother-in-law passed away before the end of the year, and a family friend recently passed away. She has stress at work and injured herself again. She states that she was seen yesterday by a nurse instead of the doctor. She states that she was not prescribed medications nor was she referred to a counselor. She is at work at present. She denies wanting to harm herself or others.  Has the patient traveled out of the country within the last 30 days? ---No  Does the patient have any new or worsening symptoms? ---Yes  Will a triage be completed? ---Yes  Related visit to physician within the last 2 weeks? ---No  Does the PT have any chronic conditions? (i.e. diabetes, asthma, etc.) ---Yes  List chronic conditions. ---Asthma, Hypercholesterolemia, Depression - takes Fluoxetine  Is the patient pregnant or possibly pregnant? (Ask all females between the ages of 82-55) ---No  Is this a behavioral health or substance abuse call? ---No     Guidelines    Guideline Title Affirmed Question Affirmed Notes  Depression Symptoms interfere with work or school    Final Disposition User   See Physician within [place], Therapist, sports, [employer] states that she has an appointment scheduled for tomorrow with Dr. Glori Bickers already.   Referrals  REFERRED TO PCP OFFICE   Disagree/Comply: Comply

## 2015-08-18 ENCOUNTER — Ambulatory Visit (INDEPENDENT_AMBULATORY_CARE_PROVIDER_SITE_OTHER): Payer: BLUE CROSS/BLUE SHIELD | Admitting: Family Medicine

## 2015-08-18 ENCOUNTER — Encounter: Payer: Self-pay | Admitting: Family Medicine

## 2015-08-18 VITALS — BP 156/86 | HR 97 | Temp 98.2°F | Ht 67.5 in | Wt 218.5 lb

## 2015-08-18 DIAGNOSIS — F4322 Adjustment disorder with anxiety: Secondary | ICD-10-CM
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
## 2015-08-18 NOTE — Progress Notes (Signed)
Pre visit review using our clinic review tool, if applicable. No additional management support is needed unless otherwise documented below in the visit note. 

## 2015-08-18 NOTE — Telephone Encounter (Signed)
fmla paperwork in dr tower  In box Pt has appointment 08/18/15 @ 3:15

## 2015-08-18 NOTE — Telephone Encounter (Signed)
I have the paperwork and will see her today as planned

## 2015-08-19 ENCOUNTER — Ambulatory Visit (INDEPENDENT_AMBULATORY_CARE_PROVIDER_SITE_OTHER): Payer: BLUE CROSS/BLUE SHIELD | Admitting: Psychology

## 2015-08-19 DIAGNOSIS — F4323 Adjustment disorder with mixed anxiety and depressed mood: Secondary | ICD-10-CM | POA: Diagnosis not present
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
## 2015-08-23 ENCOUNTER — Telehealth: Payer: Self-pay | Admitting: Family Medicine

## 2015-08-23 NOTE — Telephone Encounter (Signed)
I put letter in IN box

## 2015-08-23 NOTE — Telephone Encounter (Signed)
[employer] faxed fmla paperwork for update  Extension dates In dr tower's IN BOX

## 2015-08-24 NOTE — Telephone Encounter (Signed)
Paperwork faxed °

## 2015-08-24 NOTE — Telephone Encounter (Signed)
Given to Robin 

## 2015-08-26 ENCOUNTER — Encounter: Payer: Self-pay | Admitting: Neurology

## 2015-08-26 ENCOUNTER — Ambulatory Visit (INDEPENDENT_AMBULATORY_CARE_PROVIDER_SITE_OTHER): Payer: BLUE CROSS/BLUE SHIELD | Admitting: Neurology

## 2015-08-26 VITALS — BP 114/82 | HR 80 | Ht 68.0 in | Wt 216.0 lb

## 2015-08-26 DIAGNOSIS — M5412 Radiculopathy, cervical region: Secondary | ICD-10-CM

## 2015-08-26 DIAGNOSIS — M792 Neuralgia and neuritis, unspecified: Secondary | ICD-10-CM | POA: Diagnosis not present

## 2015-08-26 MED ORDER — GABAPENTIN 300 MG PO CAPS: 300.0000 mg | ORAL_CAPSULE | Freq: Every day | ORAL | Status: DC

## 2015-08-26 MED ORDER — CYCLOBENZAPRINE HCL 5 MG PO TABS: 5.0000 mg | ORAL_TABLET | Freq: Every evening | ORAL | Status: DC | PRN

## 2015-08-26 NOTE — Patient Instructions (Addendum)
1.  EMG of the right arm 2.  Start flexeril 5mg  at bedtime as needed for neck stiffness 3.  Start gabapentin 300mg  at bedtime daily for paresthesias 4.  Start neck physiotherapy for right cervical radiculopathy  Return to clinic in 3 months
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
## 2015-08-27 ENCOUNTER — Telehealth: Payer: Self-pay | Admitting: Family Medicine

## 2015-08-27 NOTE — Telephone Encounter (Signed)
Letter is in the IN box by [place] to fax

## 2015-08-27 NOTE — Telephone Encounter (Signed)
Patient needs an update on when she can return to work.  She is ready to go back to work now.  She needs a letter of release to send to her [place] to return to work.   Please call patient

## 2015-08-30 ENCOUNTER — Encounter: Payer: Self-pay | Admitting: Family Medicine

## 2015-08-30 NOTE — Telephone Encounter (Signed)
Pt notified of Dr. Marliss Coots comments. pt has not sought any legal help so she will think about it, she will also call Terri Bauert's office and let them know her concerns. Pt will keep [place] updated

## 2015-08-30 NOTE — Telephone Encounter (Signed)
Aware, thanks!

## 2015-08-30 NOTE — Telephone Encounter (Signed)
Letter faxed and pt notified. Pt wanted me to relay a message to Dr. Glori Bickers. Pt saw Clint Bolder the phycologist here at the office. Pt is scheduled to see her every 2 weeks but she is going back to work to the same stress that she left and she thought the counseling "would be handled differently" pt said what back up support dose she have if she is having stressful days every day, she doesn't think that seeing Coralyn Mark every 2 weeks is a good plan for her and she doesn't know what to do. I advise her she may want to call Terri Bauert's office and let them know how she is feeling about the plan too because we have no control of their schedule or have access to their records. Pt still wants Dr. Glori Bickers to know how she is feeling and doesn't know what to do about her daily stress she is about to have a work
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
## 2015-08-31 ENCOUNTER — Encounter: Payer: BLUE CROSS/BLUE SHIELD | Admitting: Neurology

## 2015-08-31 DIAGNOSIS — Z029 Encounter for administrative examinations, unspecified: Secondary | ICD-10-CM

## 2015-09-02 ENCOUNTER — Ambulatory Visit (INDEPENDENT_AMBULATORY_CARE_PROVIDER_SITE_OTHER): Payer: BLUE CROSS/BLUE SHIELD | Admitting: Psychology

## 2015-09-02 DIAGNOSIS — F4323 Adjustment disorder with mixed anxiety and depressed mood: Secondary | ICD-10-CM

## 2015-09-04 ENCOUNTER — Emergency Department (HOSPITAL_COMMUNITY): Payer: BLUE CROSS/BLUE SHIELD

## 2015-09-04 ENCOUNTER — Emergency Department (HOSPITAL_COMMUNITY)
Admission: EM | Admit: 2015-09-04 | Discharge: 2015-09-05 | Disposition: A | Discharge status: Home / Self Care | Payer: BLUE CROSS/BLUE SHIELD | Attending: Emergency Medicine | Admitting: Emergency Medicine

## 2015-09-04 DIAGNOSIS — R059 Cough, unspecified: Secondary | ICD-10-CM

## 2015-09-04 DIAGNOSIS — Z79899 Other long term (current) drug therapy: Secondary | ICD-10-CM | POA: Insufficient documentation

## 2015-09-04 DIAGNOSIS — Z8719 Personal history of other diseases of the digestive system: Secondary | ICD-10-CM | POA: Diagnosis not present

## 2015-09-04 DIAGNOSIS — Z87891 Personal history of nicotine dependence: Secondary | ICD-10-CM | POA: Insufficient documentation

## 2015-09-04 DIAGNOSIS — R05 Cough: Secondary | ICD-10-CM | POA: Diagnosis present

## 2015-09-04 DIAGNOSIS — Z8742 Personal history of other diseases of the female genital tract: Secondary | ICD-10-CM | POA: Insufficient documentation

## 2015-09-04 DIAGNOSIS — E785 Hyperlipidemia, unspecified: Secondary | ICD-10-CM | POA: Diagnosis not present

## 2015-09-04 DIAGNOSIS — F419 Anxiety disorder, unspecified: Secondary | ICD-10-CM | POA: Diagnosis not present

## 2015-09-04 DIAGNOSIS — Z862 Personal history of diseases of the blood and blood-forming organs and certain disorders involving the immune mechanism: Secondary | ICD-10-CM | POA: Insufficient documentation

## 2015-09-04 DIAGNOSIS — R0602 Shortness of breath: Secondary | ICD-10-CM

## 2015-09-04 DIAGNOSIS — R000 Tachycardia, unspecified: Secondary | ICD-10-CM | POA: Insufficient documentation

## 2015-09-04 DIAGNOSIS — J45901 Unspecified asthma with (acute) exacerbation: Secondary | ICD-10-CM | POA: Insufficient documentation

## 2015-09-04 DIAGNOSIS — Z86018 Personal history of other benign neoplasm: Secondary | ICD-10-CM | POA: Insufficient documentation

## 2015-09-04 NOTE — ED Notes (Signed)
Pt has sinus symptoms she says and cough for two days and shortness of breath,  She is worried it could be pneumonia and she doesn't have nebulizer

## 2015-09-05 LAB — BASIC METABOLIC PANEL
Anion gap: 14 (ref 5–15)
BUN: 11 mg/dL (ref 6–20)
CHLORIDE: 103 mmol/L (ref 101–111)
CO2: 26 mmol/L (ref 22–32)
CREATININE: 0.85 mg/dL (ref 0.44–1.00)
Calcium: 9.8 mg/dL (ref 8.9–10.3)
GFR calc non Af Amer: >60 mL/min (ref >60)
Glucose, Bld: 126 mg/dL — ABNORMAL HIGH (ref 65–99)
POTASSIUM: 3.7 mmol/L (ref 3.5–5.1)
SODIUM: 143 mmol/L (ref 135–145)

## 2015-09-05 LAB — CBC WITH DIFFERENTIAL/PLATELET
BASOS ABS: 0 10*3/uL (ref 0.0–0.1)
BASOS PCT: 0 %
EOS PCT: 5 %
Eosinophils Absolute: 0.3 10*3/uL (ref 0.0–0.7)
HCT: 40.7 % (ref 36.0–46.0)
Hemoglobin: 13.2 g/dL (ref 12.0–15.0)
LYMPHS PCT: 15 %
Lymphs Abs: 1 10*3/uL (ref 0.7–4.0)
MCH: 28.9 pg (ref 26.0–34.0)
MCHC: 32.4 g/dL (ref 30.0–36.0)
MCV: 89.3 fL (ref 78.0–100.0)
MONO ABS: 0.9 10*3/uL (ref 0.1–1.0)
Monocytes Relative: 12 %
Neutro Abs: 4.8 10*3/uL (ref 1.7–7.7)
Neutrophils Relative %: 68 %
PLATELETS: 234 10*3/uL (ref 150–400)
RBC: 4.56 MIL/uL (ref 3.87–5.11)
RDW: 13.6 % (ref 11.5–15.5)
WBC: 7 10*3/uL (ref 4.0–10.5)

## 2015-09-05 LAB — I-STAT TROPONIN, ED
TROPONIN I, POC: 0 ng/mL (ref 0.00–0.08)
TROPONIN I, POC: 0 ng/mL (ref 0.00–0.08)

## 2015-09-05 LAB — D-DIMER, QUANTITATIVE: D-Dimer, Quant: 0.36 ug/mL-FEU (ref 0.00–0.50)

## 2015-09-05 MED ORDER — IPRATROPIUM BROMIDE 0.02 % IN SOLN
0.5000 mg | Freq: Once | RESPIRATORY_TRACT | Status: AC
  Administered 2015-09-05: 0.5 mg via RESPIRATORY_TRACT
  Filled 2015-09-05: qty 2.5

## 2015-09-05 MED ORDER — GUAIFENESIN 100 MG/5ML PO LIQD: 100.0000 mg | ORAL | Status: DC | PRN

## 2015-09-05 MED ORDER — ALBUTEROL SULFATE (2.5 MG/3ML) 0.083% IN NEBU
5.0000 mg | INHALATION_SOLUTION | Freq: Once | RESPIRATORY_TRACT | Status: AC
  Administered 2015-09-05: 5 mg via RESPIRATORY_TRACT
  Filled 2015-09-05: qty 6

## 2015-09-05 MED ORDER — ALBUTEROL SULFATE HFA 108 (90 BASE) MCG/ACT IN AERS
2.0000 | INHALATION_SPRAY | RESPIRATORY_TRACT | Status: DC | PRN
  Administered 2015-09-05: 2 via RESPIRATORY_TRACT
  Filled 2015-09-05: qty 6.7

## 2015-09-05 MED ORDER — AZITHROMYCIN 250 MG PO TABS: 250.0000 mg | ORAL_TABLET | Freq: Every day | ORAL | Status: DC

## 2015-09-05 NOTE — Discharge Instructions (Signed)
Cough, Adult °Coughing is a reflex that clears your throat and your airways. Coughing helps to heal and protect your lungs. It is normal to cough occasionally, but a cough that happens with other symptoms or lasts a long time may be a sign of a condition that needs treatment. A cough may last only 2-3 weeks (acute), or it may last longer than 8 weeks (chronic). °CAUSES °Coughing is commonly caused by: °· Breathing in substances that irritate your lungs. °· A viral or bacterial respiratory infection. °· Allergies. °· Asthma. °· Postnasal drip. °· Smoking. °· Acid backing up from the stomach into the esophagus (gastroesophageal reflux). °· Certain medicines. °· Chronic lung problems, including COPD (or rarely, lung cancer). °· Other medical conditions such as heart failure. °HOME CARE INSTRUCTIONS  °Pay attention to any changes in your symptoms. Take these actions to help with your discomfort: °· Take medicines only as told by your health care provider. °· If you were prescribed an antibiotic medicine, take it as told by your health care provider. Do not stop taking the antibiotic even if you start to feel better. °· Talk with your health care provider before you take a cough suppressant medicine. °· Drink enough fluid to keep your urine clear or pale yellow. °· If the air is dry, use a cold steam vaporizer or humidifier in your bedroom or your home to help loosen secretions. °· Avoid anything that causes you to cough at work or at home. °· If your cough is worse at night, try sleeping in a semi-upright position. °· Avoid cigarette smoke. If you smoke, quit smoking. If you need help quitting, ask your health care provider. °· Avoid caffeine. °· Avoid alcohol. °· Rest as needed. °SEEK MEDICAL CARE IF:  °· You have new symptoms. °· You cough up pus. °· Your cough does not get better after 2-3 weeks, or your cough gets worse. °· You cannot control your cough with suppressant medicines and you are losing sleep. °· You  develop pain that is getting worse or pain that is not controlled with pain medicines. °· You have a fever. °· You have unexplained weight loss. °· You have night sweats. °SEEK IMMEDIATE MEDICAL CARE IF: °· You cough up blood. °· You have difficulty breathing. °· Your heartbeat is very fast. °  °This information is not intended to replace advice given to you by your health care provider. Make sure you discuss any questions you have with your health care provider. °  °Document Released: 12/16/2010 Document Revised: 03/10/2015 Document Reviewed: 08/26/2014 °Elsevier Interactive Patient Education ©2016 [employer] Inc[employer] ° °Shortness of Breath °Shortness of breath means you have trouble breathing. It could also mean that you have a medical problem. You should get immediate medical care for shortness of breath. °CAUSES  °· Not enough oxygen in the air such as with high altitudes or a smoke-filled room. °· Certain lung diseases, infections, or problems. °· Heart disease or conditions, such as angina or heart failure. °· Low red blood cells (anemia). °· Poor physical fitness, which can cause shortness of breath when you exercise. °· Chest or back injuries or stiffness. °· Being overweight. °· Smoking. °· Anxiety, which can make you feel like you are not getting enough air. °DIAGNOSIS  °Serious medical problems can often be found during your physical exam. Tests may also be done to determine why you are having shortness of breath. Tests may include: °· Chest X-rays. °· Lung function tests. °· Blood tests. °· An electrocardiogram (  ECG). °· An ambulatory electrocardiogram. An ambulatory ECG records your heartbeat patterns over a 24-hour period. °· Exercise testing. °· A transthoracic echocardiogram (TTE). During echocardiography, sound waves are used to evaluate how blood flows through your heart. °· A transesophageal echocardiogram (TEE). °· Imaging scans. °Your health care provider may not be able to find a cause for your  shortness of breath after your exam. In this case, it is important to have a follow-up exam with your health care provider as directed.  °TREATMENT  °Treatment for shortness of breath depends on the cause of your symptoms and can vary greatly. °HOME CARE INSTRUCTIONS  °· Do not smoke. Smoking is a common cause of shortness of breath. If you smoke, ask for help to quit. °· Avoid being around chemicals or things that may bother your breathing, such as paint fumes and dust. °· Rest as needed. Slowly resume your usual activities. °· If medicines were prescribed, take them as directed for the full length of time directed. This includes oxygen and any inhaled medicines. °· Keep all follow-up appointments as directed by your health care provider. °SEEK MEDICAL CARE IF:  °· Your condition does not improve in the time expected. °· You have a hard time doing your normal activities even with rest. °· You have any new symptoms. °SEEK IMMEDIATE MEDICAL CARE IF:  °· Your shortness of breath gets worse. °· You feel light-headed, faint, or develop a cough not controlled with medicines. °· You start coughing up blood. °· You have pain with breathing. °· You have chest pain or pain in your arms, shoulders, or abdomen. °· You have a fever. °· You are unable to walk up stairs or exercise the way you normally do. °MAKE SURE YOU: °· Understand these instructions. °· Will watch your condition. °· Will get help right away if you are not doing well or get worse. °  °This information is not intended to replace advice given to you by your health care provider. Make sure you discuss any questions you have with your health care provider. °  °Document Released: 03/14/2001 Document Revised: 06/24/2013 Document Reviewed: 09/04/2011 °Elsevier Interactive Patient Education ©2016 Elsevier Inc. ° °

## 2015-09-05 NOTE — ED Notes (Signed)
RN at bedside placing IV will get blood

## 2015-09-05 NOTE — ED Provider Notes (Signed)
CSN: [id number]     Arrival date & time 09/04/15  2334 History   First MD Initiated Contact with Patient 09/04/15 2353     Chief Complaint  Patient presents with  . Shortness of Breath  . Cough     (Consider location/radiation/quality/duration/timing/severity/associated sxs/prior Treatment) HPI Comments: Patient presents to the emergency department with chief complaint of shortness of breath. She states that the shortness of breath started this morning. She states that she has had a cough for the past 2 days as well as sinus congestion. She states that she is concerned that it was pneumonia. She has not had any productive cough. She denies any fever. She states that her chest feels tight and like a pressure, but does not hurt. She states that she has tried using an albuterol inhaler, with no relief. She denies any history of ACS, PE, or DVT.  The history is provided by the patient. No language interpreter was used.    Past Medical History  Diagnosis Date  . Anxiety   . Asthma   . HLD (hyperlipidemia)   . Tachycardia     s/p negative cardiac work up with echo  . IBS (irritable bowel syndrome)   . Endometriosis   . Rotator cuff tear, left 2009  . Syncope   . Iron deficiency anemia 1/12  . Hiatal hernia   . Gastric polyp   . Abnormal Pap smear of cervix     cryotx  . Hemorrhoids   . Uterine fibroid [id number]   Past Surgical History  Procedure Laterality Date  . Hysteroscopy    . Esophagogastroduodenoscopy  1999  . Colonoscopy  1999    small polyps, hemms  . Carpal tunnel release    . Laparoscopy  2006    endometriosis  . Esophagogastroduodenoscopy  2/12    gastric polyp and HH   Family History  Problem Relation Age of Onset  . Hypertension Father   . Diabetes Father   . Iron deficiency Father     anemia  . Other Father     Barrett's esoph  . Stroke Father   . Migraines Father   . Dementia Mother   . Diabetes Sister   . Hyperlipidemia Sister   . Neuropathy Daughter    . Anxiety disorder Daughter    Social History  Substance Use Topics  . Smoking status: Former Smoker -- 0.25 packs/day for 5 years    Types: Cigarettes  . Smokeless tobacco: Never Used  . Alcohol Use: 0.0 oz/week    0 Standard drinks or equivalent per week     Comment: Rarely, once every few months   OB History    Gravida Para Term Preterm AB TAB SAB Ectopic Multiple Living   1 1 1       1      Review of Systems  Respiratory: Positive for cough and shortness of breath.   All other systems reviewed and are negative.     Allergies  Hydrocodone-acetaminophen; Nsaids; Povidone-iodine; and Propoxyphene n-acetaminophen  Home Medications   Prior to Admission medications   Medication Sig Start Date End Date Taking? Authorizing Provider  albuterol (PROAIR HFA) 108 (90 Base) MCG/ACT inhaler Inhale 2 puffs into the lungs every 4 (four) hours as needed. Patient not taking: Reported on 08/26/2015 08/16/15   Abner Greenspan, MD  atorvastatin (LIPITOR) 20 MG tablet Take 1 tablet (20 mg total) by mouth daily. 08/16/15   Abner Greenspan, MD  cyclobenzaprine (FLEXERIL) 5 MG tablet Take  1 tablet (5 mg total) by mouth at bedtime as needed for muscle spasms (and neck stiffness). 08/26/15   Donika Keith Rake, DO  esomeprazole (NEXIUM) 40 MG capsule Take 1 capsule (40 mg total) by mouth every morning. 08/16/15   Abner Greenspan, MD  FLUoxetine (PROZAC) 40 MG capsule Take 1 capsule (40 mg total) by mouth daily. 08/16/15   Abner Greenspan, MD  fluticasone (FLONASE) 50 MCG/ACT nasal spray Place 2 sprays into both nostrils as needed. Patient not taking: Reported on 08/26/2015 08/16/15   Abner Greenspan, MD  gabapentin (NEURONTIN) 300 MG capsule Take 1 capsule (300 mg total) by mouth at bedtime. 08/26/15   Donika Keith Rake, DO  hydrocortisone (ANUSOL-HC) 2.5 % rectal cream Place 1 application rectally as needed. Reported on 08/26/2015    Historical Provider, MD  montelukast (SINGULAIR) 10 MG tablet Take 1 tablet (10 mg total)  by mouth at bedtime. 08/16/15   Wynelle Fanny Tower, MD   BP 143/87 mmHg  Pulse 113  Temp(Src) 98.9 F (37.2 C) (Oral)  Resp 22  SpO2 100%  LMP 10/08/2013 Physical Exam  Constitutional: She is oriented to person, place, and time. She appears well-developed and well-nourished.  HENT:  Head: Normocephalic and atraumatic.  Eyes: Conjunctivae and EOM are normal. Pupils are equal, round, and reactive to light.  Neck: Normal range of motion. Neck supple.  Cardiovascular: Regular rhythm.  Exam reveals no gallop and no friction rub.   No murmur heard. Tachycardic  Pulmonary/Chest: Effort normal and breath sounds normal. No respiratory distress. She has no wheezes. She has no rales. She exhibits no tenderness.  CTAB  Abdominal: Soft. Bowel sounds are normal. She exhibits no distension and no mass. There is no tenderness. There is no rebound and no guarding.  Musculoskeletal: Normal range of motion. She exhibits no edema or tenderness.  Neurological: She is alert and oriented to person, place, and time.  Skin: Skin is warm and dry.  Psychiatric: She has a normal mood and affect. Her behavior is normal. Judgment and thought content normal.  Nursing note and vitals reviewed.   ED Course  Procedures (including critical care time) Labs Review Labs Reviewed  CBC WITH DIFFERENTIAL/PLATELET  BASIC METABOLIC PANEL  D-DIMER, QUANTITATIVE (NOT AT [facility])  I-STAT TROPOININ, ED    Imaging Review No results found. I have personally reviewed and evaluated these images and lab results as part of my medical decision-making.   EKG Interpretation None      MDM   Final diagnoses:  None    Patient with shortness of breath and chest tightness. She has also had a cough. She is concerned that she might be developing a pneumonia, so she came to the emergency department for evaluation. She has not had a fever, nor has she had productive cough. She denies any body aches or chills. Doubt flu. Patient is noted  to be tachycardic to 113 in triage. She states that the shortness of breath still persists despite using an albuterol inhaler several hours ago. She states that she has some chest tightness or chest pressure, which is new. She denies history of ACS, PE, or DVT.  Patient initially concerned about shortness of breath/cough/possible pneumonia, however she has some new chest tightness/pressure. Her lung sounds are clear. She'll need additional workup beyond chest x-ray. Will check troponin, EKG, labs, and d-dimer because of tachycardia and shortness of breath. Cannot apply  PERC criteria because of age and tachycardia.  12:48 AM Patient signed out  to [employer], PA-C.  Plan:   Follow-up on labs, d-dimer, ekg.  Delta trop.      Montine Circle, PA-C 09/05/15 [id number]  Charlesetta Shanks, MD 09/13/15 [phone]

## 2015-09-05 NOTE — ED Provider Notes (Signed)
Patient originally seen by Montine Circle, PA-C. Signed out to me at end of shift.   "Patient with shortness of breath and chest tightness. She has also had a cough. She is concerned that she might be developing a pneumonia, so she came to the emergency department for evaluation. She has not had a fever, nor has she had productive cough. She denies any body aches or chills. Doubt flu. Patient is noted to be tachycardic to 113 in triage. She states that the shortness of breath still persists despite using an albuterol inhaler several hours ago. She states that she has some chest tightness or chest pressure, which is new. She denies history of ACS, PE, or DVT.  Patient initially concerned about shortness of breath/cough/possible pneumonia, however she has some new chest tightness/pressure. Her lung sounds are clear. She'll need additional workup beyond chest x-ray. Will check troponin, EKG, labs, and d-dimer because of tachycardia and shortness of breath. Cannot apply PERC criteria because of age and tachycardia." - [place], PA-C  Labs Reviewed  BASIC METABOLIC PANEL - Abnormal; Notable for the following:    Glucose, Bld 126 (*)    All other components within normal limits  CBC WITH DIFFERENTIAL/PLATELET  D-DIMER, QUANTITATIVE (NOT AT [facility])  I-STAT TROPOININ, ED  I-STAT TROPOININ, ED   The patients imaging and labs are reassuring. She has had a negative d-dimer and a negative delta Troponin. She is feeling much better except requests another breathing treatment as she felt the first one helped. She was given another breathing treatment which she feels did help, will give her Azithromycin and Guaifenesin. Pt tachycardic at discharge - it had improved prior to repeat breathing treatment. Close follow-up with PCP recommended and return precautions discussed.  Filed Vitals:   09/05/15 0400 09/05/15 0427  BP: 142/70   Pulse: 116   Temp:  99.2 F (37.3 C)  Resp: [address]  PA-C 09/06/15 2017  Charlesetta Shanks, MD 09/13/15 [phone]

## 2015-09-08 ENCOUNTER — Encounter: Payer: Self-pay | Admitting: Family Medicine

## 2015-09-08 ENCOUNTER — Ambulatory Visit (INDEPENDENT_AMBULATORY_CARE_PROVIDER_SITE_OTHER): Payer: BLUE CROSS/BLUE SHIELD | Admitting: Family Medicine

## 2015-09-08 VITALS — BP 118/86 | HR 86 | Temp 98.5°F | Ht 68.0 in | Wt 212.0 lb

## 2015-09-08 DIAGNOSIS — J209 Acute bronchitis, unspecified: Secondary | ICD-10-CM | POA: Diagnosis not present

## 2015-09-08 MED ORDER — PREDNISONE 10 MG PO TABS: ORAL_TABLET | ORAL | Status: DC

## 2015-09-08 NOTE — Patient Instructions (Signed)
I think you have viral bronchitis and a cold  Take the prednisone as directed - it may make your hyper and hungry  Drink lots of fluids Continue the robitussin  Lungs sound ok today - if things worsen please let me know (short of breath or fever or worse cough) Breathe steam  Use nasal saline spray to help sinuses as well    Update if not starting to improve in a week or if worsening

## 2015-09-08 NOTE — Progress Notes (Signed)
Pre visit review using our clinic review tool, if applicable. No additional management support is needed unless otherwise documented below in the visit note. 

## 2015-09-08 NOTE — Progress Notes (Signed)
Subjective:    Patient ID: Michele [name], female    DOB: [birth year], 50 y.o.   MRN: [id number]  HPI  Here with uri symptoms   Seen on 3/5 with bad cough and chest tightness  Now bad sinus congestion  Low grade temp-not fever   Not coughing up anything  Wheezing comes and goes - using inhaler a few times a day  Sinuses are packed up and pnd -not getting much out of her nose   Given zithromax - today is her last day  Taking robitussin for cough   Is exhausted    Dg Chest 2 View  09/05/2015  CLINICAL DATA:  Acute onset of sinus congestion, cough, chest tightness and shortness of breath. Initial encounter. EXAM: CHEST  2 VIEW COMPARISON:  Chest radiograph from 04/29/2012 FINDINGS: The lungs are well-aerated and clear. There is no evidence of focal opacification, pleural effusion or pneumothorax. The heart is normal in size; the mediastinal contour is within normal limits. No acute osseous abnormalities are seen. IMPRESSION: No acute cardiopulmonary process seen. Electronically Signed   By: Garald Balding M.D.   On: 09/05/2015 00:43     Patient Active Problem List   Diagnosis Date Noted  . Acute bronchitis 09/08/2015  . Adjustment reaction with anxious mood 08/18/2015  . Arm paresthesia, right 07/14/2015  . Diarrhea 10/13/2014  . Rectal pressure 10/01/2013  . Rectocele 10/01/2013  . Painful intercourse 10/01/2013  . Skin tag 04/23/2013  . Stress incontinence in female 04/23/2013  . Routine gynecological examination 04/01/2012  . Hyperglycemia 04/01/2012  . Routine general medical examination at a health care facility 03/11/2012  . GERD 08/26/2010  . History of anemia 07/19/2010  . FREQUENCY, URINARY 07/06/2010  . MICROSCOPIC HEMATURIA 06/09/2010  . History of colonic polyps 11/15/2007  . IBS 10/08/2007  . ENDOMETRIOSIS 10/08/2007  . Hyperlipidemia 10/30/2006  . Anxiety and depression 10/30/2006  . HEMORRHOIDS, INTERNAL 10/30/2006  . ASTHMA 10/30/2006  . MIGRAINES, HX OF  10/30/2006   Past Medical History  Diagnosis Date  . Anxiety   . Asthma   . HLD (hyperlipidemia)   . Tachycardia     s/p negative cardiac work up with echo  . IBS (irritable bowel syndrome)   . Endometriosis   . Rotator cuff tear, left 2009  . Syncope   . Iron deficiency anemia 1/12  . Hiatal hernia   . Gastric polyp   . Abnormal Pap smear of cervix     cryotx  . Hemorrhoids   . Uterine fibroid [id number]   Past Surgical History  Procedure Laterality Date  . Hysteroscopy    . Esophagogastroduodenoscopy  1999  . Colonoscopy  1999    small polyps, hemms  . Carpal tunnel release    . Laparoscopy  2006    endometriosis  . Esophagogastroduodenoscopy  2/12    gastric polyp and HH   Social History  Substance Use Topics  . Smoking status: Former Smoker -- 0.25 packs/day for 5 years    Types: Cigarettes  . Smokeless tobacco: Never Used  . Alcohol Use: 0.0 oz/week    0 Standard drinks or equivalent per week     Comment: Rarely, once every few months   Family History  Problem Relation Age of Onset  . Hypertension Father   . Diabetes Father   . Iron deficiency Father     anemia  . Other Father     Barrett's esoph  . Stroke Father   . Migraines Father   .  Dementia Mother   . Diabetes Sister   . Hyperlipidemia Sister   . Neuropathy Daughter   . Anxiety disorder Daughter    Allergies  Allergen Reactions  . Hydrocodone-Acetaminophen Itching  . Nsaids Nausea And Vomiting       . Povidone-Iodine Rash       . Propoxyphene N-Acetaminophen Itching        Current Outpatient Prescriptions on File Prior to Visit  Medication Sig Dispense Refill  . albuterol (PROAIR HFA) 108 (90 Base) MCG/ACT inhaler Inhale 2 puffs into the lungs every 4 (four) hours as needed. 3 Inhaler 2  . atorvastatin (LIPITOR) 20 MG tablet Take 1 tablet (20 mg total) by mouth daily. 90 tablet 1  . BIOTIN 5000 PO Take 1 tablet by mouth daily.    . cyclobenzaprine (FLEXERIL) 5 MG tablet Take 1 tablet (5  mg total) by mouth at bedtime as needed for muscle spasms (and neck stiffness). 30 tablet 2  . esomeprazole (NEXIUM) 40 MG capsule Take 1 capsule (40 mg total) by mouth every morning. 90 capsule 1  . FLUoxetine (PROZAC) 40 MG capsule Take 1 capsule (40 mg total) by mouth daily. 90 capsule 1  . fluticasone (FLONASE) 50 MCG/ACT nasal spray Place 2 sprays into both nostrils as needed. 16 g 5  . gabapentin (NEURONTIN) 300 MG capsule Take 1 capsule (300 mg total) by mouth at bedtime. 30 capsule 2  . guaiFENesin (ROBITUSSIN) 100 MG/5ML liquid Take 5-10 mLs (100-200 mg total) by mouth every 4 (four) hours as needed for cough. 60 mL 0  . montelukast (SINGULAIR) 10 MG tablet Take 1 tablet (10 mg total) by mouth at bedtime. 90 tablet 1  . vitamin C (ASCORBIC ACID) 500 MG tablet Take 500 mg by mouth daily.    . [DISCONTINUED] norethindrone-ethinyl estradiol-iron (MICROGESTIN FE1.5/30) 1.5-30 MG-MCG tablet Take 1 tablet by mouth daily.       No current facility-administered medications on file prior to visit.     Review of Systems Review of Systems  Constitutional: Negative for fever, appetite change,  and unexpected weight change.  ENT pos for cong /rhinorrhea /sinus pressure  Eyes: Negative for pain and visual disturbance.  Respiratory: Negative for shortness of breath.  pos for cough and wheeze  Cardiovascular: Negative for cp or palpitations    Gastrointestinal: Negative for nausea, diarrhea and constipation.  Genitourinary: Negative for urgency and frequency.  Skin: Negative for pallor or rash   Neurological: Negative for weakness, light-headedness, numbness and headaches.  Hematological: Negative for adenopathy. Does not bruise/bleed easily.  Psychiatric/Behavioral: Negative for dysphoric mood. The patient is not nervous/anxious.         Objective:   Physical Exam  Constitutional: She appears well-developed and well-nourished. No distress.  obese and well appearing  Fatigued   HENT:    Head: Normocephalic and atraumatic.  Right Ear: External ear normal.  Left Ear: External ear normal.  Mouth/Throat: Oropharynx is clear and moist.  Nares are injected and congested  No sinus tenderness Clear rhinorrhea and post nasal drip   Eyes: Conjunctivae and EOM are normal. Pupils are equal, round, and reactive to light. Right eye exhibits no discharge. Left eye exhibits no discharge.  Neck: Normal range of motion. Neck supple.  Cardiovascular: Normal rate and normal heart sounds.   Pulmonary/Chest: Effort normal. No respiratory distress. She has wheezes. She has no rales. She exhibits no tenderness.  Wheeze diffusely on forced expiration  No rales or rhonchi   Harsh bs  Lymphadenopathy:    She has no cervical adenopathy.  Neurological: She is alert.  Skin: Skin is warm and dry. No rash noted.  Psychiatric: She has a normal mood and affect.          Assessment & Plan:   Problem List Items Addressed This Visit      Respiratory   Acute bronchitis - Primary    With cough/chest tightness Reviewed ED visit  Still symptomatic but reassuring exam   I think you have viral bronchitis and a cold  Take the prednisone as directed - it may make your hyper and hungry  Drink lots of fluids Continue the robitussin  Lungs sound ok today - if things worsen please let me know (short of breath or fever or worse cough) Breathe steam  Use nasal saline spray to help sinuses as well    Update if not starting to improve in a week or if worsening

## 2015-09-09 NOTE — Assessment & Plan Note (Signed)
With cough/chest tightness Reviewed ED visit  Still symptomatic but reassuring exam   I think you have viral bronchitis and a cold  Take the prednisone as directed - it may make your hyper and hungry  Drink lots of fluids Continue the robitussin  Lungs sound ok today - if things worsen please let me know (short of breath or fever or worse cough) Breathe steam  Use nasal saline spray to help sinuses as well    Update if not starting to improve in a week or if worsening

## 2015-09-16 ENCOUNTER — Ambulatory Visit: Payer: BLUE CROSS/BLUE SHIELD | Admitting: Psychology

## 2015-11-26 ENCOUNTER — Ambulatory Visit: Payer: BLUE CROSS/BLUE SHIELD | Admitting: Neurology

## 2015-11-26 DIAGNOSIS — Z029 Encounter for administrative examinations, unspecified: Secondary | ICD-10-CM

## 2016-01-19 ENCOUNTER — Ambulatory Visit (INDEPENDENT_AMBULATORY_CARE_PROVIDER_SITE_OTHER): Payer: Commercial Managed Care - PPO | Admitting: Family Medicine

## 2016-01-19 ENCOUNTER — Encounter: Payer: Self-pay | Admitting: Family Medicine

## 2016-01-19 ENCOUNTER — Ambulatory Visit (INDEPENDENT_AMBULATORY_CARE_PROVIDER_SITE_OTHER)
Admission: RE | Admit: 2016-01-19 | Discharge: 2016-01-19 | Disposition: A | Discharge status: Home / Self Care | Payer: Commercial Managed Care - PPO | Source: Ambulatory Visit | Attending: Family Medicine | Admitting: Family Medicine

## 2016-01-19 VITALS — BP 136/84 | HR 80 | Temp 98.3°F | Ht 68.0 in | Wt 219.2 lb

## 2016-01-19 DIAGNOSIS — M791 Myalgia, unspecified site: Secondary | ICD-10-CM

## 2016-01-19 DIAGNOSIS — M2550 Pain in unspecified joint: Secondary | ICD-10-CM

## 2016-01-19 DIAGNOSIS — R21 Rash and other nonspecific skin eruption: Secondary | ICD-10-CM

## 2016-01-19 LAB — CBC WITH DIFFERENTIAL/PLATELET
BASOS PCT: 0.3 % (ref 0.0–3.0)
Basophils Absolute: 0 10*3/uL (ref 0.0–0.1)
EOS ABS: 0.4 10*3/uL (ref 0.0–0.7)
Eosinophils Relative: 5.8 % — ABNORMAL HIGH (ref 0.0–5.0)
HCT: 40.4 % (ref 36.0–46.0)
HEMOGLOBIN: 13.6 g/dL (ref 12.0–15.0)
Lymphocytes Relative: 24.6 % (ref 12.0–46.0)
Lymphs Abs: 1.5 10*3/uL (ref 0.7–4.0)
MCHC: 33.7 g/dL (ref 30.0–36.0)
MCV: 86.3 fl (ref 78.0–100.0)
MONO ABS: 0.4 10*3/uL (ref 0.1–1.0)
Monocytes Relative: 6 % (ref 3.0–12.0)
Neutro Abs: 3.9 10*3/uL (ref 1.4–7.7)
Neutrophils Relative %: 63.3 % (ref 43.0–77.0)
Platelets: 343 10*3/uL (ref 150.0–400.0)
RBC: 4.68 Mil/uL (ref 3.87–5.11)
RDW: 13.9 % (ref 11.5–15.5)
WBC: 6.2 10*3/uL (ref 4.0–10.5)

## 2016-01-19 LAB — COMPREHENSIVE METABOLIC PANEL
ALK PHOS: 130 U/L — AB (ref 39–117)
ALT: 28 U/L (ref 0–35)
AST: 24 U/L (ref 0–37)
Albumin: 3.9 g/dL (ref 3.5–5.2)
BUN: 10 mg/dL (ref 6–23)
CHLORIDE: 106 meq/L (ref 96–112)
CO2: 29 meq/L (ref 19–32)
Calcium: 9.1 mg/dL (ref 8.4–10.5)
Creatinine, Ser: 0.7 mg/dL (ref 0.40–1.20)
GFR: 93.73 mL/min (ref >60.00)
GLUCOSE: 135 mg/dL — AB (ref 70–99)
POTASSIUM: 4 meq/L (ref 3.5–5.1)
SODIUM: 141 meq/L (ref 135–145)
TOTAL PROTEIN: 6.6 g/dL (ref 6.0–8.3)
Total Bilirubin: 0.4 mg/dL (ref 0.2–1.2)

## 2016-01-19 LAB — TSH: TSH: 1.89 u[IU]/mL (ref 0.35–4.50)

## 2016-01-19 LAB — C-REACTIVE PROTEIN: CRP: 0.5 mg/dL (ref 0.5–20.0)

## 2016-01-19 LAB — SEDIMENTATION RATE: Sed Rate: 10 mm/hr (ref 0–30)

## 2016-01-19 NOTE — Progress Notes (Signed)
Pre visit review using our clinic review tool, if applicable. No additional management support is needed unless otherwise documented below in the visit note. 

## 2016-01-19 NOTE — Patient Instructions (Addendum)
Labs today for joint and muscle pain  Xray of hands  Use cool compresses when you can  Always take aleve with food and drink lots of fluids  If you develop fever or other symptoms please let me know  If rash does not continue to improve let me know

## 2016-01-19 NOTE — Progress Notes (Signed)
Subjective:    Patient ID: Michele [name], female    DOB: 12/02/1965, 50 y.o.   MRN: [id number]  HPI Here for joint pain  achey a lot more lately  ? If arthritis Has to take aleve regularly 2 twice daily as needed   Some swelling in finger joints- hard to get her rings on  Ankle swell a little bit- but was on feet in the hot weather   Area just above and below L elbow are tender   No red joints   No fever   No tick bite  Has a rash on her R upper leg after going to the [place]  (not in the ocean) Now is faded Was raised red spots  It was not very itchy  Did not put anything on it / soap and water and letting it air out  No other area   Stress is improved Looking for a job - was prev under stress with her prev job   Patient Active Problem List   Diagnosis Date Noted  . Multiple joint pain 01/19/2016  . Myalgia 01/19/2016  . Rash and nonspecific skin eruption 01/19/2016  . Acute bronchitis 09/08/2015  . Adjustment reaction with anxious mood 08/18/2015  . Arm paresthesia, right 07/14/2015  . Diarrhea 10/13/2014  . Rectal pressure 10/01/2013  . Rectocele 10/01/2013  . Painful intercourse 10/01/2013  . Skin tag 04/23/2013  . Stress incontinence in female 04/23/2013  . Routine gynecological examination 04/01/2012  . Hyperglycemia 04/01/2012  . Routine general medical examination at a health care facility 03/11/2012  . GERD 08/26/2010  . History of anemia 07/19/2010  . FREQUENCY, URINARY 07/06/2010  . MICROSCOPIC HEMATURIA 06/09/2010  . History of colonic polyps 11/15/2007  . IBS 10/08/2007  . ENDOMETRIOSIS 10/08/2007  . Hyperlipidemia 10/30/2006  . Anxiety and depression 10/30/2006  . HEMORRHOIDS, INTERNAL 10/30/2006  . ASTHMA 10/30/2006  . MIGRAINES, HX OF 10/30/2006   Past Medical History  Diagnosis Date  . Anxiety   . Asthma   . HLD (hyperlipidemia)   . Tachycardia     s/p negative cardiac work up with echo  . IBS (irritable bowel syndrome)   .  Endometriosis   . Rotator cuff tear, left 2009  . Syncope   . Iron deficiency anemia 1/12  . Hiatal hernia   . Gastric polyp   . Abnormal Pap smear of cervix     cryotx  . Hemorrhoids   . Uterine fibroid [id number]   Past Surgical History  Procedure Laterality Date  . Hysteroscopy    . Esophagogastroduodenoscopy  1999  . Colonoscopy  1999    small polyps, hemms  . Carpal tunnel release    . Laparoscopy  2006    endometriosis  . Esophagogastroduodenoscopy  2/12    gastric polyp and HH   Social History  Substance Use Topics  . Smoking status: Former Smoker -- 0.25 packs/day for 5 years    Types: Cigarettes  . Smokeless tobacco: Never Used  . Alcohol Use: 0.0 oz/week    0 Standard drinks or equivalent per week     Comment: Rarely, once every few months   Family History  Problem Relation Age of Onset  . Hypertension Father   . Diabetes Father   . Iron deficiency Father     anemia  . Other Father     Barrett's esoph  . Stroke Father   . Migraines Father   . Dementia Mother   . Diabetes Sister   .  Hyperlipidemia Sister   . Neuropathy Daughter   . Anxiety disorder Daughter    Allergies  Allergen Reactions  . Hydrocodone-Acetaminophen Itching  . Nsaids Nausea And Vomiting       . Povidone-Iodine Rash       . Propoxyphene N-Acetaminophen Itching        Current Outpatient Prescriptions on File Prior to Visit  Medication Sig Dispense Refill  . albuterol (PROAIR HFA) 108 (90 Base) MCG/ACT inhaler Inhale 2 puffs into the lungs every 4 (four) hours as needed. 3 Inhaler 2  . atorvastatin (LIPITOR) 20 MG tablet Take 1 tablet (20 mg total) by mouth daily. 90 tablet 1  . BIOTIN 5000 PO Take 1 tablet by mouth daily.    . cyclobenzaprine (FLEXERIL) 5 MG tablet Take 1 tablet (5 mg total) by mouth at bedtime as needed for muscle spasms (and neck stiffness). 30 tablet 2  . esomeprazole (NEXIUM) 40 MG capsule Take 1 capsule (40 mg total) by mouth every morning. 90 capsule 1  .  FLUoxetine (PROZAC) 40 MG capsule Take 1 capsule (40 mg total) by mouth daily. 90 capsule 1  . fluticasone (FLONASE) 50 MCG/ACT nasal spray Place 2 sprays into both nostrils as needed. 16 g 5  . gabapentin (NEURONTIN) 300 MG capsule Take 1 capsule (300 mg total) by mouth at bedtime. 30 capsule 2  . montelukast (SINGULAIR) 10 MG tablet Take 1 tablet (10 mg total) by mouth at bedtime. 90 tablet 1  . [DISCONTINUED] norethindrone-ethinyl estradiol-iron (MICROGESTIN FE1.5/30) 1.5-30 MG-MCG tablet Take 1 tablet by mouth daily.       No current facility-administered medications on file prior to visit.    Review of Systems Review of Systems  Constitutional: Negative for fever, appetite change, fatigue and unexpected weight change.  Eyes: Negative for pain and visual disturbance.  Respiratory: Negative for cough and shortness of breath.   Cardiovascular: Negative for cp or palpitations    Gastrointestinal: Negative for nausea, diarrhea and constipation.  Genitourinary: Negative for urgency and frequency.  Skin: Negative for pallor and pos for rash  that is improving  Neurological: Negative for weakness, light-headedness, numbness and headaches.  MSK pos for joint and muscle pain  Hematological: Negative for adenopathy. Does not bruise/bleed easily.  Psychiatric/Behavioral: Negative for dysphoric mood. The patient is not nervous/anxious.         Objective:   Physical Exam  Constitutional: She appears well-developed and well-nourished. No distress.  obese and well appearing   HENT:  Head: Normocephalic and atraumatic.  Mouth/Throat: Oropharynx is clear and moist.  Eyes: Conjunctivae and EOM are normal. Pupils are equal, round, and reactive to light. Right eye exhibits no discharge. Left eye exhibits no discharge. No scleral icterus.  Neck: Normal range of motion. Neck supple.  Nl rom   Cardiovascular: Normal rate, regular rhythm and normal heart sounds.   Pulmonary/Chest: Effort normal and  breath sounds normal.  Musculoskeletal: She exhibits tenderness. She exhibits no edema.  Tender MCP joints and some phalanx tenderness Also knees and ankles   No focal joint swelling or redness today  Some myofasical trigger points on back   Trapezius tenderness   Nl rom all joints   Lymphadenopathy:    She has no cervical adenopathy.  Neurological: She is alert. She has normal reflexes. No cranial nerve deficit. She exhibits normal muscle tone. Coordination normal.  Skin: Skin is warm and dry. Rash noted.  Rash on R upper leg- with raised erythematous papules and plaques (small and  scattered)- per picture pt brought-much improved from several days ago   No vesicles or excoriations   Psychiatric: She has a normal mood and affect.          Assessment & Plan:   Problem List Items Addressed This Visit      Musculoskeletal and Integument   Rash and nonspecific skin eruption    On R upper leg that (per picture) is improving - maculopapular  Doubt related to her pain issues and she denies tick bites  Will continue to watch  Labs were done for tick bourne illness today      Relevant Orders   Lyme Ab/Western Blot Reflex (Completed)     Other   Myalgia    Along with joint pain  W/u for PMR/autoimmune dz and tick fever  Also has a rash- likely unrelated but not sure       Relevant Orders   TSH (Completed)   Comprehensive metabolic panel (Completed)   Sedimentation Rate (Completed)   C-reactive protein (Completed)   Rocky mtn spotted fvr abs pnl(IgG+IgM) (Completed)   Lyme Ab/Western Blot Reflex (Completed)   Multiple joint pain - Primary    Rheumatoid labs/ ESR/ CRP and tick labs today  Also xray of hands- where many of her c/o are  No swollen joints on exam  Also has c/o some myofasical pain  If all neg- ? Consider poss of myofasical pain syndrome/fibromyalgia       Relevant Orders   CBC with Differential/Platelet (Completed)   Sedimentation Rate (Completed)     C-reactive protein (Completed)   Rheumatoid factor (Completed)   ANA (Completed)   DG HANDS 1 VIEW BILAT BALLCATCHERS (Completed)   Rocky mtn spotted fvr abs pnl(IgG+IgM) (Completed)   Lyme Ab/Western Blot Reflex (Completed)

## 2016-01-20 LAB — REFLEX RMSF IGG TITER: RMSF IgG Titer: 1:64 {titer} — ABNORMAL HIGH

## 2016-01-20 LAB — ANA: Anti Nuclear Antibody(ANA): NEGATIVE

## 2016-01-20 LAB — RHEUMATOID FACTOR

## 2016-01-20 LAB — ROCKY MTN SPOTTED FVR ABS PNL(IGG+IGM)
RMSF IgG: DETECTED — AB
RMSF IgM: NOT DETECTED

## 2016-01-20 LAB — LYME AB/WESTERN BLOT REFLEX

## 2016-01-20 NOTE — Assessment & Plan Note (Signed)
Along with joint pain  W/u for PMR/autoimmune dz and tick fever  Also has a rash- likely unrelated but not sure

## 2016-01-20 NOTE — Assessment & Plan Note (Signed)
On R upper leg that (per picture) is improving - maculopapular  Doubt related to her pain issues and she denies tick bites  Will continue to watch  Labs were done for tick bourne illness today

## 2016-01-20 NOTE — Assessment & Plan Note (Signed)
Rheumatoid labs/ ESR/ CRP and tick labs today  Also xray of hands- where many of her c/o are  No swollen joints on exam  Also has c/o some myofasical pain  If all neg- ? Consider poss of myofasical pain syndrome/fibromyalgia

## 2016-01-21 ENCOUNTER — Telehealth: Payer: Self-pay | Admitting: *Deleted

## 2016-01-21 MED ORDER — DOXYCYCLINE HYCLATE 100 MG PO TABS: 100.0000 mg | ORAL_TABLET | Freq: Two times a day (BID) | ORAL | Status: DC

## 2016-01-21 NOTE — Telephone Encounter (Signed)
Pt notified of Dr. Marliss Coots comments and verbalized understanding. Rx sent to pharmacy and pt will update [place] after finishing abx

## 2016-01-21 NOTE — Telephone Encounter (Signed)
-----   Message from Abner Greenspan, MD sent at 01/21/2016  4:52 PM EDT ----- I suspect if she had tick fever -it was in the past (not now)-however in light of recent joint pain and rash w/o other cause I would like to cover with doxycycline Please call in doxycycline 100 mg 1 po bid for 10 d #20 no ref - take with non dairy food  Alert me if any changes Let me know how she is doing after finishing the abx

## 2016-02-08 ENCOUNTER — Other Ambulatory Visit: Payer: Self-pay | Admitting: *Deleted

## 2016-02-08 DIAGNOSIS — J4520 Mild intermittent asthma, uncomplicated: Secondary | ICD-10-CM

## 2016-02-08 MED ORDER — MONTELUKAST SODIUM 10 MG PO TABS
10.0000 mg | ORAL_TABLET | Freq: Every day | ORAL | 1 refills | Status: DC
Start: 2016-02-08 — End: 2016-08-06

## 2016-02-08 MED ORDER — FLUOXETINE HCL 40 MG PO CAPS: 40.0000 mg | ORAL_CAPSULE | Freq: Every day | ORAL | 1 refills | Status: DC

## 2016-02-08 MED ORDER — ATORVASTATIN CALCIUM 20 MG PO TABS: 20.0000 mg | ORAL_TABLET | Freq: Every day | ORAL | 1 refills | Status: DC

## 2016-02-08 MED ORDER — ESOMEPRAZOLE MAGNESIUM 40 MG PO CPDR: 40.0000 mg | DELAYED_RELEASE_CAPSULE | Freq: Every morning | ORAL | 1 refills | Status: DC

## 2016-02-22 ENCOUNTER — Encounter: Payer: Self-pay | Admitting: Family Medicine

## 2016-03-01 ENCOUNTER — Telehealth (HOSPITAL_COMMUNITY): Payer: Self-pay

## 2016-03-01 NOTE — Telephone Encounter (Signed)
PT l/m on v/mail asking for an appt w/psych.  Called pt back and l/m on her v/mail to call to schedule an appt.

## 2016-04-04 ENCOUNTER — Ambulatory Visit (INDEPENDENT_AMBULATORY_CARE_PROVIDER_SITE_OTHER): Payer: Commercial Managed Care - PPO | Admitting: Family Medicine

## 2016-04-04 ENCOUNTER — Encounter: Payer: Self-pay | Admitting: Family Medicine

## 2016-04-04 VITALS — BP 116/68 | HR 78 | Temp 98.2°F | Ht 68.0 in | Wt 215.8 lb

## 2016-04-04 DIAGNOSIS — R350 Frequency of micturition: Secondary | ICD-10-CM

## 2016-04-04 DIAGNOSIS — R1011 Right upper quadrant pain: Secondary | ICD-10-CM

## 2016-04-04 DIAGNOSIS — Z23 Encounter for immunization: Secondary | ICD-10-CM

## 2016-04-04 LAB — POC URINALSYSI DIPSTICK (AUTOMATED)
BILIRUBIN UA: NEGATIVE
GLUCOSE UA: NEGATIVE
Ketones, UA: NEGATIVE
LEUKOCYTES UA: NEGATIVE
NITRITE UA: NEGATIVE
Protein, UA: NEGATIVE
RBC UA: NEGATIVE
Spec Grav, UA: >=1.03
UROBILINOGEN UA: 0.2
pH, UA: 6

## 2016-04-04 LAB — HEPATIC FUNCTION PANEL
ALT: 34 U/L (ref 0–35)
AST: 22 U/L (ref 0–37)
Albumin: 4 g/dL (ref 3.5–5.2)
Alkaline Phosphatase: 120 U/L — ABNORMAL HIGH (ref 39–117)
BILIRUBIN DIRECT: 0.1 mg/dL (ref 0.0–0.3)
BILIRUBIN TOTAL: 0.4 mg/dL (ref 0.2–1.2)
Total Protein: 7 g/dL (ref 6.0–8.3)

## 2016-04-04 LAB — CBC WITH DIFFERENTIAL/PLATELET
BASOS PCT: 0.5 % (ref 0.0–3.0)
Basophils Absolute: 0 10*3/uL (ref 0.0–0.1)
EOS ABS: 0.3 10*3/uL (ref 0.0–0.7)
EOS PCT: 4 % (ref 0.0–5.0)
HCT: 42.9 % (ref 36.0–46.0)
Hemoglobin: 14.6 g/dL (ref 12.0–15.0)
LYMPHS ABS: 2.2 10*3/uL (ref 0.7–4.0)
Lymphocytes Relative: 30.2 % (ref 12.0–46.0)
MCHC: 34.1 g/dL (ref 30.0–36.0)
MCV: 87.4 fl (ref 78.0–100.0)
MONO ABS: 0.5 10*3/uL (ref 0.1–1.0)
Monocytes Relative: 6.4 % (ref 3.0–12.0)
NEUTROS ABS: 4.2 10*3/uL (ref 1.4–7.7)
NEUTROS PCT: 58.9 % (ref 43.0–77.0)
PLATELETS: 292 10*3/uL (ref 150.0–400.0)
RBC: 4.9 Mil/uL (ref 3.87–5.11)
RDW: 13.4 % (ref 11.5–15.5)
WBC: 7.2 10*3/uL (ref 4.0–10.5)

## 2016-04-04 LAB — BASIC METABOLIC PANEL
BUN: 11 mg/dL (ref 6–23)
CHLORIDE: 104 meq/L (ref 96–112)
CO2: 30 mEq/L (ref 19–32)
Calcium: 9.4 mg/dL (ref 8.4–10.5)
Creatinine, Ser: 0.85 mg/dL (ref 0.40–1.20)
GFR: 74.86 mL/min (ref >60.00)
GLUCOSE: 145 mg/dL — AB (ref 70–99)
POTASSIUM: 4.2 meq/L (ref 3.5–5.1)
SODIUM: 142 meq/L (ref 135–145)

## 2016-04-04 LAB — LIPASE: Lipase: 11 U/L (ref 11.0–59.0)

## 2016-04-04 LAB — AMYLASE: Amylase: 24 U/L — ABNORMAL LOW (ref 27–131)

## 2016-04-04 NOTE — Progress Notes (Signed)
Pre visit review using our clinic review tool, if applicable. No additional management support is needed unless otherwise documented below in the visit note. 

## 2016-04-04 NOTE — Patient Instructions (Addendum)
Drink fluids  Avoid all fats -eat a bland diet  Stop at check out to schedule abdominal ultrasound Labs now  If symptoms suddenly worsen-get to the ER and alert [place]

## 2016-04-04 NOTE — Progress Notes (Signed)
Subjective:    Patient ID: Michele [name], female    DOB: 1966/02/12, 50 y.o.   MRN: [id number]  HPI Here with R side pain - right under ribs (still has a gb)  Having a lot of gas Going on for 4 d Frequent urination   Thinks in her teens - ? May have had a gallstone   Had nausea Had elevated temp? (felt feverish)  Ok now   Symptoms do not correlate with eating -but nauseated so not eating much   Some constipation  No diarrhea  No change in color of stool or blood in stool   UA is clear today despite frequent urination  Trying to keep up fluid intake   Patient Active Problem List   Diagnosis Date Noted  . RUQ abdominal pain 04/04/2016  . Multiple joint pain 01/19/2016  . Myalgia 01/19/2016  . Rash and nonspecific skin eruption 01/19/2016  . Acute bronchitis 09/08/2015  . Adjustment reaction with anxious mood 08/18/2015  . Arm paresthesia, right 07/14/2015  . Diarrhea 10/13/2014  . Rectal pressure 10/01/2013  . Rectocele 10/01/2013  . Painful intercourse 10/01/2013  . Skin tag 04/23/2013  . Stress incontinence in female 04/23/2013  . Routine gynecological examination 04/01/2012  . Hyperglycemia 04/01/2012  . Routine general medical examination at a health care facility 03/11/2012  . GERD 08/26/2010  . History of anemia 07/19/2010  . FREQUENCY, URINARY 07/06/2010  . MICROSCOPIC HEMATURIA 06/09/2010  . History of colonic polyps 11/15/2007  . IBS 10/08/2007  . ENDOMETRIOSIS 10/08/2007  . Hyperlipidemia 10/30/2006  . Anxiety and depression 10/30/2006  . HEMORRHOIDS, INTERNAL 10/30/2006  . ASTHMA 10/30/2006  . MIGRAINES, HX OF 10/30/2006   Past Medical History:  Diagnosis Date  . Abnormal Pap smear of cervix    cryotx  . Anxiety   . Asthma   . Endometriosis   . Gastric polyp   . Hemorrhoids   . Hiatal hernia   . HLD (hyperlipidemia)   . IBS (irritable bowel syndrome)   . Iron deficiency anemia 1/12  . Rotator cuff tear, left 2009  . Syncope   .  Tachycardia    s/p negative cardiac work up with echo  . Uterine fibroid [id number]   Past Surgical History:  Procedure Laterality Date  . CARPAL TUNNEL RELEASE    . COLONOSCOPY  1999   small polyps, hemms  . ESOPHAGOGASTRODUODENOSCOPY  1999  . ESOPHAGOGASTRODUODENOSCOPY  2/12   gastric polyp and HH  . HYSTEROSCOPY    . LAPAROSCOPY  2006   endometriosis   Social History  Substance Use Topics  . Smoking status: Former Smoker    Packs/day: 0.25    Years: 5.00    Types: Cigarettes  . Smokeless tobacco: Never Used  . Alcohol use 0.0 oz/week     Comment: Rarely, once every few months   Family History  Problem Relation Age of Onset  . Hypertension Father   . Diabetes Father   . Iron deficiency Father     anemia  . Other Father     Barrett's esoph  . Stroke Father   . Migraines Father   . Dementia Mother   . Diabetes Sister   . Hyperlipidemia Sister   . Neuropathy Daughter   . Anxiety disorder Daughter    Allergies  Allergen Reactions  . Hydrocodone-Acetaminophen Itching  . Nsaids Nausea And Vomiting       . Povidone-Iodine Rash       . Propoxyphene N-Acetaminophen Itching  Current Outpatient Prescriptions on File Prior to Visit  Medication Sig Dispense Refill  . albuterol (PROAIR HFA) 108 (90 Base) MCG/ACT inhaler Inhale 2 puffs into the lungs every 4 (four) hours as needed. 3 Inhaler 2  . atorvastatin (LIPITOR) 20 MG tablet Take 1 tablet (20 mg total) by mouth daily. 90 tablet 1  . BIOTIN 5000 PO Take 1 tablet by mouth daily.    . cyclobenzaprine (FLEXERIL) 5 MG tablet Take 1 tablet (5 mg total) by mouth at bedtime as needed for muscle spasms (and neck stiffness). 30 tablet 2  . doxycycline (VIBRA-TABS) 100 MG tablet Take 1 tablet (100 mg total) by mouth 2 (two) times daily. 20 tablet 0  . esomeprazole (NEXIUM) 40 MG capsule Take 1 capsule (40 mg total) by mouth every morning. 90 capsule 1  . FLUoxetine (PROZAC) 40 MG capsule Take 1 capsule (40 mg total) by  mouth daily. 90 capsule 1  . fluticasone (FLONASE) 50 MCG/ACT nasal spray Place 2 sprays into both nostrils as needed. 16 g 5  . gabapentin (NEURONTIN) 300 MG capsule Take 1 capsule (300 mg total) by mouth at bedtime. 30 capsule 2  . montelukast (SINGULAIR) 10 MG tablet Take 1 tablet (10 mg total) by mouth at bedtime. 90 tablet 1  . Omega-3 Fatty Acids (OMEGA 3 PO) Take 1 capsule by mouth daily.    . [DISCONTINUED] norethindrone-ethinyl estradiol-iron (MICROGESTIN FE1.5/30) 1.5-30 MG-MCG tablet Take 1 tablet by mouth daily.       No current facility-administered medications on file prior to visit.      Review of Systems Review of Systems  Constitutional: Negative for fever, appetite change, fatigue and unexpected weight change.  Eyes: Negative for pain and visual disturbance.  Respiratory: Negative for cough and shortness of breath.   Cardiovascular: Negative for cp or palpitations    Gastrointestinal: Negative for vomiting, diarrhea and constipation. pos for RUQ abd pain  Genitourinary: Negative for urgency and frequency.  Skin: Negative for pallor or rash   Neurological: Negative for weakness, light-headedness, numbness and headaches.  Hematological: Negative for adenopathy. Does not bruise/bleed easily.  Psychiatric/Behavioral: Negative for dysphoric mood. The patient is not nervous/anxious.         Objective:   Physical Exam  Constitutional: She appears well-developed and well-nourished. No distress.  obese and well appearing   HENT:  Head: Normocephalic and atraumatic.  Mouth/Throat: Oropharynx is clear and moist.  Eyes: Conjunctivae and EOM are normal. Pupils are equal, round, and reactive to light. No scleral icterus.  Neck: Normal range of motion. Neck supple.  Cardiovascular: Normal rate, regular rhythm and normal heart sounds.   Pulmonary/Chest: Effort normal and breath sounds normal. No respiratory distress. She has no wheezes. She has no rales.  Abdominal: Soft. Bowel  sounds are normal. She exhibits no distension, no abdominal bruit, no pulsatile midline mass and no mass. There is no hepatosplenomegaly. There is tenderness in the right upper quadrant and epigastric area. There is positive Murphy's sign. There is no rigidity, no rebound, no guarding, no CVA tenderness and no tenderness at McBurney's point.  RUQ tenderness with murphy's sign  No HSM  Lymphadenopathy:    She has no cervical adenopathy.  Neurological: She is alert. No cranial nerve deficit. She exhibits normal muscle tone. Coordination normal.  Skin: Skin is warm and dry. No erythema. No pallor.  No jaundice   Psychiatric: She has a normal mood and affect.          Assessment &  Plan:   Problem List Items Addressed This Visit      Other   FREQUENCY, URINARY - Primary    UA is clear today      Relevant Orders   POCT Urinalysis Dipstick (Automated) (Completed)   RUQ abdominal pain    New and intermittent  Suspect this could be gallstones given timing and location  Lab for LFT/chem/cbc and panc enz today  Schedule abd [place]  Disc imp of low fat diet/ small portions/ bland Keep up fluids  If symptoms become severe-inst to go to ED and alert [place]  Pending results for further action       Relevant Orders   CBC with Differential/Platelet (Completed)   Hepatic function panel (Completed)   Basic metabolic panel (Completed)   Lipase (Completed)   Amylase (Completed)   US Abdomen Complete    Other Visit Diagnoses    Need for influenza vaccination       Relevant Orders   Flu Vaccine QUAD 36+ mos IM (Completed)

## 2016-04-05 NOTE — Assessment & Plan Note (Signed)
New and intermittent  Suspect this could be gallstones given timing and location  Lab for LFT/chem/cbc and panc enz today  Schedule abd [place]  Disc imp of low fat diet/ small portions/ bland Keep up fluids  If symptoms become severe-inst to go to ED and alert [place]  Pending results for further action

## 2016-04-05 NOTE — Assessment & Plan Note (Signed)
UA is clear today

## 2016-04-06 ENCOUNTER — Ambulatory Visit
Admission: RE | Admit: 2016-04-06 | Discharge: 2016-04-06 | Disposition: A | Discharge status: Home / Self Care | Payer: Commercial Managed Care - PPO | Source: Ambulatory Visit | Attending: Family Medicine | Admitting: Family Medicine

## 2016-04-06 DIAGNOSIS — R1011 Right upper quadrant pain: Secondary | ICD-10-CM | POA: Insufficient documentation

## 2016-04-12 ENCOUNTER — Telehealth: Payer: Self-pay

## 2016-04-12 ENCOUNTER — Telehealth: Payer: Self-pay | Admitting: Family Medicine

## 2016-04-12 DIAGNOSIS — R1011 Right upper quadrant pain: Secondary | ICD-10-CM

## 2016-04-12 DIAGNOSIS — R110 Nausea: Secondary | ICD-10-CM | POA: Insufficient documentation

## 2016-04-12 NOTE — Telephone Encounter (Signed)
Patient has been playing phone tag trying to get her results from her Ultrasound.  I gave her the results.  Patient said she's still having sharp pains occasionally, but not as often as she was.  She still has nausea and bloating.  Patient is taking Nexium 1 x a day. Patient would like to be referred to GI.  Patient wants to go to [place] and she can go anytime.

## 2016-04-12 NOTE — Telephone Encounter (Signed)
Referral done Will route to [facility]  

## 2016-04-12 NOTE — Telephone Encounter (Signed)
PLEASE NOTE: All timestamps contained within this report are represented as [place] Standard Time. CONFIDENTIALTY NOTICE: This fax transmission is intended only for the addressee. It contains information that is legally privileged, confidential or otherwise protected from use or disclosure. If you are not the intended recipient, you are strictly prohibited from reviewing, disclosing, copying using or disseminating any of this information or taking any action in reliance on or regarding this information. If you have received this fax in error, please notify us immediately by telephone so that we can arrange for its return to [place]. Phone: [phone], Toll-Free: [phone], Fax: [phone] Page: 1 of 1 Call Id: [id number] [facility] Night - Client Nonclinical Telephone Record [place] Night - Client Client Site [facility], Roque Lias - MD Contact Type Call Who Is Calling Patient / Member / Family / Caregiver Caller Name [facility] Phone Number [phone] Reason for Call Request for Lab/Test Results Initial Comment Caller states she just got a call from the NP for test results. PT Michele [name] 1965-11-30. Call Closed By: Oswaldo Milian Transaction Date/Time: 04/11/2016 6:10:07 PM (ET)

## 2016-04-14 ENCOUNTER — Encounter: Payer: Self-pay | Admitting: *Deleted

## 2016-04-14 ENCOUNTER — Ambulatory Visit (INDEPENDENT_AMBULATORY_CARE_PROVIDER_SITE_OTHER): Payer: Commercial Managed Care - PPO | Admitting: Internal Medicine

## 2016-04-14 VITALS — BP 106/78 | HR 84 | Ht 67.75 in | Wt 216.4 lb

## 2016-04-14 DIAGNOSIS — R1011 Right upper quadrant pain: Secondary | ICD-10-CM

## 2016-04-14 DIAGNOSIS — K760 Fatty (change of) liver, not elsewhere classified: Secondary | ICD-10-CM

## 2016-04-14 DIAGNOSIS — R110 Nausea: Secondary | ICD-10-CM

## 2016-04-14 MED ORDER — ESOMEPRAZOLE MAGNESIUM 40 MG PO CPDR: 40.0000 mg | DELAYED_RELEASE_CAPSULE | Freq: Two times a day (BID) | ORAL | 1 refills | Status: DC

## 2016-04-14 MED ORDER — VITAMIN E 180 MG (400 UNIT) PO CAPS: 800.0000 [IU] | ORAL_CAPSULE | Freq: Every day | ORAL | 3 refills | Status: DC

## 2016-04-14 NOTE — Progress Notes (Signed)
Patient ID: Michele [name], female   DOB: 01/20/1966, 50 y.o.   MRN: [id number] HPI: Michele [name] is a 50 year old female with a past medical history of iron deficiency anemia, hyperplastic colon polyp, GERD, hiatal hernia, hemorrhoids who is seen in consultation at the request of Dr. Glori Bickers to evaluate right upper quadrant abdominal pain. She is here alone today. She also has a history of endometriosis, hyperlipidemia, and depression.  She reports that over the last 2-4 weeks she's developed considerable right upper quadrant abdominal pain. This is aching and at times quite sharp. It's worse with food. It's been associated with nausea and a feeling of indigestion. She has not vomited. She's been on Nexium long-term for heartburn which previously worked very well. Recently she feels like it may not be as effective. She denies dysphagia or odynophagia. She's been eating a very bland diet after being seen by primary care. She's been drinking smoothies rather than eating a lot of solid or heavy foods. She has a history of constipation but this seems to be a little better with her recent dietary change. She denies blood in her stool or melena. She denies mid or lower abdominal pain. She's not had fevers or chills. She denies chest pain and shortness of breath.  Dr. Glori Bickers performed an abdominal ultrasound which I have reviewed.  This showed fatty liver but no gallstones or evidence of acute cholecystitis. There are no other acute abnormality seen in the abdomen. Liver enzymes were checked and normal except for a mild elevation in serum alkaline phosphatase.  Family history notable for a father with Barrett's esophagus.  She recalls prior upper endoscopy colonoscopy and video capsule endoscopy performed with Dr. Lizbeth Bark. I have records of prior upper endoscopy and colonoscopy. EGD was performed on 08/11/2010. This showed a very tiny hiatal hernia and a single semi-sessile polyp in the greater curvature of the  stomach which was biopsied. The examined duodenum was normal. Colonoscopy performed on 08/17/2010 showed internal and external hemorrhoids and to small sessile polyps removed from the sigmoid. Pathology consistent with hyperplastic polyp. I do not have records of the video capsule results but she was told this was unremarkable.  Past Medical History:  Diagnosis Date  . Abnormal Pap smear of cervix    cryotx  . Anxiety   . Arthritis   . Asthma   . Depression   . Endometriosis   . Gastric polyp   . GERD (gastroesophageal reflux disease)   . Hemorrhoids   . Hiatal hernia   . HLD (hyperlipidemia)   . Hyperplastic colon polyp   . IBS (irritable bowel syndrome)   . Iron deficiency anemia 1/12  . Pneumonia   . Rotator cuff tear, left 2009  . Syncope   . Tachycardia    s/p negative cardiac work up with echo  . Uterine fibroid [id number]    Past Surgical History:  Procedure Laterality Date  . CARPAL TUNNEL RELEASE Bilateral   . COLONOSCOPY  1999   small polyps, hemms  . ESOPHAGOGASTRODUODENOSCOPY  1999  . ESOPHAGOGASTRODUODENOSCOPY  2/12   gastric polyp and HH  . HYSTEROSCOPY    . LAPAROSCOPY  2006   endometriosis  . ROTATOR CUFF REPAIR Left     Outpatient Medications Prior to Visit  Medication Sig Dispense Refill  . albuterol (PROAIR HFA) 108 (90 Base) MCG/ACT inhaler Inhale 2 puffs into the lungs every 4 (four) hours as needed. 3 Inhaler 2  . atorvastatin (LIPITOR) 20 MG tablet Take 1 tablet (  20 mg total) by mouth daily. 90 tablet 1  . BIOTIN 5000 PO Take 1 tablet by mouth daily.    . cyclobenzaprine (FLEXERIL) 5 MG tablet Take 1 tablet (5 mg total) by mouth at bedtime as needed for muscle spasms (and neck stiffness). 30 tablet 2  . doxycycline (VIBRA-TABS) 100 MG tablet Take 1 tablet (100 mg total) by mouth 2 (two) times daily. 20 tablet 0  . FLUoxetine (PROZAC) 40 MG capsule Take 1 capsule (40 mg total) by mouth daily. 90 capsule 1  . fluticasone (FLONASE) 50 MCG/ACT nasal  spray Place 2 sprays into both nostrils as needed. 16 g 5  . gabapentin (NEURONTIN) 300 MG capsule Take 1 capsule (300 mg total) by mouth at bedtime. (Patient taking differently: Take 300 mg by mouth at bedtime as needed. ) 30 capsule 2  . montelukast (SINGULAIR) 10 MG tablet Take 1 tablet (10 mg total) by mouth at bedtime. 90 tablet 1  . Omega-3 Fatty Acids (OMEGA 3 PO) Take 1 capsule by mouth daily.    Marland Kitchen esomeprazole (NEXIUM) 40 MG capsule Take 1 capsule (40 mg total) by mouth every morning. 90 capsule 1   No facility-administered medications prior to visit.     Allergies  Allergen Reactions  . Hydrocodone-Acetaminophen Itching  . Nsaids Nausea And Vomiting       . Povidone-Iodine Rash       . Propoxyphene N-Acetaminophen Itching         Family History  Problem Relation Age of Onset  . Hypertension Father   . Diabetes Father   . Stroke Father   . Migraines Father   . Barrett's esophagus Father   . Anemia Father     iron def  . Heart disease Father     pacemaker, a fib, CHF  . Dementia Mother   . Diabetes Sister   . Hyperlipidemia Sister   . Neuropathy Daughter   . Anxiety disorder Daughter     Social History  Substance Use Topics  . Smoking status: Former Smoker    Packs/day: 0.25    Years: 5.00    Types: Cigarettes    Quit date: 07/03/1992  . Smokeless tobacco: Never Used  . Alcohol use 0.0 oz/week     Comment: Rarely, once every few months    ROS: As per history of present illness, otherwise negative  BP 106/78 (BP Location: Left Arm, Patient Position: Sitting, Cuff Size: Normal)   Pulse 84   Ht 5' 7.75" (1.721 m) Comment: height measured without shoes  Wt 216 lb 6 oz (98.1 kg)   LMP 03/06/2014   BMI 33.14 kg/m  Constitutional: Well-developed and well-nourished. No distress. HEENT: Normocephalic and atraumatic. Oropharynx is clear and moist. No oropharyngeal exudate. Conjunctivae are normal.  No scleral icterus. Neck: Neck supple. Trachea  midline. Cardiovascular: Normal rate, regular rhythm and intact distal pulses. No M/R/G Pulmonary/chest: Effort normal and breath sounds normal. No wheezing, rales or rhonchi. Abdominal: Soft, Obese, right upper quadrant tenderness without rebound or guarding, nondistended. Bowel sounds active throughout. There are no masses palpable. No hepatosplenomegaly. Extremities: no clubbing, cyanosis, or edema Lymphadenopathy: No cervical adenopathy noted. Neurological: Alert and oriented to person place and time. Skin: Skin is warm and dry. No rashes noted. Psychiatric: Normal mood and affect. Behavior is normal.  RELEVANT LABS AND IMAGING: CBC    Component Value Date/Time   WBC 7.2 04/04/2016 1029   RBC 4.90 04/04/2016 1029   HGB 14.6 04/04/2016 1029   HGB  13.5 03/28/2013 1043   HCT 42.9 04/04/2016 1029   HCT 39.7 03/28/2013 1043   PLT 292.0 04/04/2016 1029   PLT 249 03/28/2013 1043   MCV 87.4 04/04/2016 1029   MCV 89.5 03/28/2013 1043   MCH 28.9 09/05/2015 0050   MCHC 34.1 04/04/2016 1029   RDW 13.4 04/04/2016 1029   RDW 12.9 03/28/2013 1043   LYMPHSABS 2.2 04/04/2016 1029   LYMPHSABS 1.6 03/28/2013 1043   MONOABS 0.5 04/04/2016 1029   MONOABS 0.5 03/28/2013 1043   EOSABS 0.3 04/04/2016 1029   EOSABS 0.3 03/28/2013 1043   BASOSABS 0.0 04/04/2016 1029   BASOSABS 0.0 03/28/2013 1043    CMP     Component Value Date/Time   NA 142 04/04/2016 1029   NA 140 03/28/2013 1044   K 4.2 04/04/2016 1029   K 4.3 03/28/2013 1044   CL 104 04/04/2016 1029   CO2 30 04/04/2016 1029   CO2 27 03/28/2013 1044   GLUCOSE 145 (H) 04/04/2016 1029   GLUCOSE 123 03/28/2013 1044   BUN 11 04/04/2016 1029   BUN 8.5 03/28/2013 1044   CREATININE 0.85 04/04/2016 1029   CREATININE 0.8 03/28/2013 1044   CALCIUM 9.4 04/04/2016 1029   CALCIUM 9.2 03/28/2013 1044   PROT 7.0 04/04/2016 1029   ALBUMIN 4.0 04/04/2016 1029   AST 22 04/04/2016 1029   ALT 34 04/04/2016 1029   ALKPHOS 120 (H) 04/04/2016 1029    BILITOT 0.4 04/04/2016 1029   GFRNONAA >60 09/05/2015 0050   GFRAA >60 09/05/2015 0050   ABDOMEN ULTRASOUND COMPLETE   COMPARISON:  None in PACs   FINDINGS: Gallbladder: No gallstones or wall thickening visualized. No sonographic Murphy sign noted by sonographer.   Common bile duct: Diameter: 4.8 mm   Liver: The hepatic echotexture is mildly increased diffusely. Decreased echo texture in the hepatic parenchyma adjacent to the gallbladder likely reflects focal fatty sparing. There is no discrete mass or ductal dilation.   IVC: No abnormality visualized.   Pancreas: Visualization of the pancreatic head and tail is limited due to bowel gas. The pancreatic body is unremarkable.   Spleen: Size and appearance within normal limits.   Right Kidney: Length: A 10.4 cm. Echogenicity within normal limits. No mass or hydronephrosis visualized.   Left Kidney: Length: 11.0 cm. Echogenicity within normal limits. No mass or hydronephrosis visualized.   Abdominal aorta: No aneurysm visualized.   Other findings: None.   IMPRESSION: Increased hepatic echotexture compatible with fatty infiltrative change. No gallstones or sonographic evidence of acute cholecystitis. If there are clinical concerns of gallbladder dysfunction, a nuclear medicine hepatobiliary scan with gallbladder ejection fraction determination may be useful.   No acute abnormality observed elsewhere within the abdomen.     Electronically Signed   By: David  Martinique M.D.   On: 04/06/2016 09:00  ASSESSMENT/PLAN:  50 year old female with a past medical history of iron deficiency anemia, hyperplastic colon polyp, GERD, hiatal hernia, hemorrhoids who is seen in consultation at the request of Dr. Glori Bickers to evaluate right upper quadrant abdominal pain.  1. Right upper quadrant abdominal pain -- symptoms suspicious for biliary pain. I recommended HIDA scan with CCK for further evaluation. I'm increasing her Nexium to 40 mg  twice daily as gastritis or duodenitis is also possible. If CCK unremarkable and no response to increase PPI, EGD would be recommended as the next diagnostic tests. Continue bland, low-fat diet. Notify me if pain worsening prior to completion of the workup. She voiced understanding  2. Fatty  liver -- we discussed fatty liver today including the long-term risk of fibrosis and even cirrhosis. No evidence of advanced liver disease at present. Transaminases are normal which is reassuring. I recommended that she worked control risk factors including obesity, hypertension and hyperlipidemia. I recommended that she maintain an exercise regimen and begin vitamin E 800 international units daily  3. CRC screening -- 2 previous colonoscopies in 2012 in 2001. No history of adenoma. Screening colonoscopy recommended to be repeated in 2022      Cc:Abner Greenspan, Md [address] [place], [place] [address]

## 2016-04-14 NOTE — Patient Instructions (Signed)
You will be due for a recall colonoscopy in 08/2020. We will send you a reminder in the mail when it gets closer to that time.  We have sent the following medications to your pharmacy for you to pick up at your convenience: Vitamin E 800 mg daily Nexium 40 mg twice daily  You have been scheduled for a HIDA scan at [facility] (1st floor) on 04/20/16. Please arrive 15 minutes prior to your scheduled appointment at  [id number] am. Make certain not to have anything to eat or drink after midnight on the night before your test. Should this appointment date or time not work well for you, please call radiology scheduling at [phone].  _____________________________________________________________________ hepatobiliary (HIDA) scan is an imaging procedure used to diagnose problems in the liver, gallbladder and bile ducts. In the HIDA scan, a radioactive chemical or tracer is injected into a vein in your arm. The tracer is handled by the liver like bile. Bile is a fluid produced and excreted by your liver that helps your digestive system break down fats in the foods you eat. Bile is stored in your gallbladder and the gallbladder releases the bile when you eat a meal. A special nuclear medicine scanner (gamma camera) tracks the flow of the tracer from your liver into your gallbladder and small intestine.  During your HIDA scan  You'll be asked to change into a hospital gown before your HIDA scan begins. Your health care team will position you on a table, usually on your back. The radioactive tracer is then injected into a vein in your arm.The tracer travels through your bloodstream to your liver, where it's taken up by the bile-producing cells. The radioactive tracer travels with the bile from your liver into your gallbladder and through your bile ducts to your small intestine.You may feel some pressure while the radioactive tracer is injected into your vein. As you lie on the table, a special gamma camera is  positioned over your abdomen taking pictures of the tracer as it moves through your body. The gamma camera takes pictures continually for about an hour. You'll need to keep still during the HIDA scan. This can become uncomfortable, but you may find that you can lessen the discomfort by taking deep breaths and thinking about other things. Tell your health care team if you're uncomfortable. The radiologist will watch on a computer the progress of the radioactive tracer through your body. The HIDA scan may be stopped when the radioactive tracer is seen in the gallbladder and enters your small intestine. This typically takes about an hour. In some cases extra imaging will be performed if original images aren't satisfactory, if morphine is given to help visualize the gallbladder or if the medication CCK is given to look at the contraction of the gallbladder. This test typically takes 2 hours to complete. ________________________________________________________________________  If your HIDA scan is normal, we may consider endoscopy.  If you are age 48 or older, your body mass index should be between 23-30. Your Body mass index is 33.14 kg/m. If this is out of the aforementioned range listed, please consider follow up with your Primary Care Provider.  If you are age 32 or younger, your body mass index should be between 19-25. Your Body mass index is 33.14 kg/m. If this is out of the aformentioned range listed, please consider follow up with your Primary Care Provider.

## 2016-04-20 ENCOUNTER — Encounter (HOSPITAL_COMMUNITY)
Admission: RE | Admit: 2016-04-20 | Discharge: 2016-04-20 | Disposition: A | Discharge status: Home / Self Care | Payer: Commercial Managed Care - PPO | Source: Ambulatory Visit | Attending: Internal Medicine | Admitting: Internal Medicine

## 2016-04-20 DIAGNOSIS — R1011 Right upper quadrant pain: Secondary | ICD-10-CM

## 2016-04-20 DIAGNOSIS — K760 Fatty (change of) liver, not elsewhere classified: Secondary | ICD-10-CM | POA: Diagnosis present

## 2016-04-20 DIAGNOSIS — R110 Nausea: Secondary | ICD-10-CM | POA: Diagnosis present

## 2016-04-20 MED ORDER — TECHNETIUM TC 99M MEBROFENIN IV KIT
5.0000 | PACK | Freq: Once | INTRAVENOUS | Status: AC | PRN
  Administered 2016-04-20: 5 via INTRAVENOUS

## 2016-04-20 MED ORDER — SINCALIDE 5 MCG IJ SOLR
0.0200 ug/kg | Freq: Once | INTRAMUSCULAR | Status: AC
  Administered 2016-04-20: 2 ug via INTRAVENOUS

## 2016-05-17 ENCOUNTER — Encounter: Payer: Self-pay | Admitting: Family Medicine

## 2016-05-17 ENCOUNTER — Ambulatory Visit (INDEPENDENT_AMBULATORY_CARE_PROVIDER_SITE_OTHER): Payer: Commercial Managed Care - PPO | Admitting: Family Medicine

## 2016-05-17 VITALS — BP 126/72 | HR 89 | Temp 98.5°F | Ht 67.75 in | Wt 219.8 lb

## 2016-05-17 DIAGNOSIS — J029 Acute pharyngitis, unspecified: Secondary | ICD-10-CM | POA: Diagnosis not present

## 2016-05-17 DIAGNOSIS — B9789 Other viral agents as the cause of diseases classified elsewhere: Secondary | ICD-10-CM

## 2016-05-17 DIAGNOSIS — J069 Acute upper respiratory infection, unspecified: Secondary | ICD-10-CM | POA: Diagnosis not present

## 2016-05-17 LAB — POCT RAPID STREP A (OFFICE): RAPID STREP A SCREEN: NEGATIVE

## 2016-05-17 NOTE — Progress Notes (Signed)
Pre visit review using our clinic review tool, if applicable. No additional management support is needed unless otherwise documented below in the visit note. 

## 2016-05-17 NOTE — Patient Instructions (Addendum)
Drink lots of fluids Try to get extra rest  Try zinc lozenges early in the course  Get nasal saline spray or a netti pot / and breathe steam  Aleve or ibuprofen with food (if they don't bother stomach) can help sinus swelling and pain and fever  tyelnol helps pain and fever  nyquil is ok at night/ day quil as well   Update if not starting to improve in a week or if worsening

## 2016-05-17 NOTE — Progress Notes (Signed)
Subjective:    Patient ID: Michele [name], female    DOB: 03-10-1966, 50 y.o.   MRN: [id number]  HPI Here for uri symptoms with sore throat   Started yesterday with raw throat and nasal congestion/ sinus congestion  Feels swollen in sinuses pnd /not a lot of runny nose  Worse on the L side   Not able to blow anything out   occ dry cough / clearing throat   Ears feel sore  No fever   Results for orders placed or performed in visit on 05/17/16  Rapid Strep A  Result Value Ref Range   Rapid Strep A Screen Negative Negative    Patient Active Problem List   Diagnosis Date Noted  . Viral URI 05/17/2016  . Nausea without vomiting 04/12/2016  . RUQ abdominal pain 04/04/2016  . Multiple joint pain 01/19/2016  . Myalgia 01/19/2016  . Rash and nonspecific skin eruption 01/19/2016  . Adjustment reaction with anxious mood 08/18/2015  . Arm paresthesia, right 07/14/2015  . Diarrhea 10/13/2014  . Rectal pressure 10/01/2013  . Rectocele 10/01/2013  . Painful intercourse 10/01/2013  . Skin tag 04/23/2013  . Stress incontinence in female 04/23/2013  . Routine gynecological examination 04/01/2012  . Hyperglycemia 04/01/2012  . Routine general medical examination at a health care facility 03/11/2012  . GERD 08/26/2010  . History of anemia 07/19/2010  . FREQUENCY, URINARY 07/06/2010  . MICROSCOPIC HEMATURIA 06/09/2010  . History of colonic polyps 11/15/2007  . IBS 10/08/2007  . ENDOMETRIOSIS 10/08/2007  . Hyperlipidemia 10/30/2006  . Anxiety and depression 10/30/2006  . HEMORRHOIDS, INTERNAL 10/30/2006  . ASTHMA 10/30/2006  . MIGRAINES, HX OF 10/30/2006   Past Medical History:  Diagnosis Date  . Abnormal Pap smear of cervix    cryotx  . Anxiety   . Arthritis   . Asthma   . Depression   . Endometriosis   . Gastric polyp   . GERD (gastroesophageal reflux disease)   . Hemorrhoids   . Hiatal hernia   . HLD (hyperlipidemia)   . Hyperplastic colon polyp   . IBS (irritable  bowel syndrome)   . Iron deficiency anemia 1/12  . Pneumonia   . Rotator cuff tear, left 2009  . Syncope   . Tachycardia    s/p negative cardiac work up with echo  . Uterine fibroid [id number]   Past Surgical History:  Procedure Laterality Date  . CARPAL TUNNEL RELEASE Bilateral   . COLONOSCOPY  1999   small polyps, hemms  . ESOPHAGOGASTRODUODENOSCOPY  1999  . ESOPHAGOGASTRODUODENOSCOPY  2/12   gastric polyp and HH  . HYSTEROSCOPY    . LAPAROSCOPY  2006   endometriosis  . ROTATOR CUFF REPAIR Left    Social History  Substance Use Topics  . Smoking status: Former Smoker    Packs/day: 0.25    Years: 5.00    Types: Cigarettes    Quit date: 07/03/1992  . Smokeless tobacco: Never Used  . Alcohol use 0.0 oz/week     Comment: Rarely, once every few months   Family History  Problem Relation Age of Onset  . Hypertension Father   . Diabetes Father   . Stroke Father   . Migraines Father   . Barrett's esophagus Father   . Anemia Father     iron def  . Heart disease Father     pacemaker, a fib, CHF  . Dementia Mother   . Diabetes Sister   . Hyperlipidemia Sister   . Neuropathy  Daughter   . Anxiety disorder Daughter    Allergies  Allergen Reactions  . Hydrocodone-Acetaminophen Itching  . Nsaids Nausea And Vomiting       . Povidone-Iodine Rash       . Propoxyphene N-Acetaminophen Itching        Current Outpatient Prescriptions on File Prior to Visit  Medication Sig Dispense Refill  . albuterol (PROAIR HFA) 108 (90 Base) MCG/ACT inhaler Inhale 2 puffs into the lungs every 4 (four) hours as needed. 3 Inhaler 2  . atorvastatin (LIPITOR) 20 MG tablet Take 1 tablet (20 mg total) by mouth daily. 90 tablet 1  . BIOTIN 5000 PO Take 1 tablet by mouth daily.    . Cholecalciferol (VITAMIN D3 PO) Take 1 tablet by mouth daily.    . cyclobenzaprine (FLEXERIL) 5 MG tablet Take 1 tablet (5 mg total) by mouth at bedtime as needed for muscle spasms (and neck stiffness). 30 tablet 2  .  doxycycline (VIBRA-TABS) 100 MG tablet Take 1 tablet (100 mg total) by mouth 2 (two) times daily. 20 tablet 0  . esomeprazole (NEXIUM) 40 MG capsule Take 1 capsule (40 mg total) by mouth 2 (two) times daily before a meal. 180 capsule 1  . FLUoxetine (PROZAC) 40 MG capsule Take 1 capsule (40 mg total) by mouth daily. 90 capsule 1  . fluticasone (FLONASE) 50 MCG/ACT nasal spray Place 2 sprays into both nostrils as needed. 16 g 5  . gabapentin (NEURONTIN) 300 MG capsule Take 1 capsule (300 mg total) by mouth at bedtime. (Patient taking differently: Take 300 mg by mouth at bedtime as needed. ) 30 capsule 2  . montelukast (SINGULAIR) 10 MG tablet Take 1 tablet (10 mg total) by mouth at bedtime. 90 tablet 1  . Omega-3 Fatty Acids (OMEGA 3 PO) Take 1 capsule by mouth daily.    . Prenatal Vit-Fe Fumarate-FA (PRENATAL PO) Take 1 tablet by mouth daily.    . vitamin E (VITAMIN E) 400 UNIT capsule Take 2 capsules (800 Units total) by mouth daily. 60 capsule 3  . [DISCONTINUED] norethindrone-ethinyl estradiol-iron (MICROGESTIN FE1.5/30) 1.5-30 MG-MCG tablet Take 1 tablet by mouth daily.       No current facility-administered medications on file prior to visit.     Review of Systems  Constitutional: Positive for appetite change and fatigue. Negative for fever.  HENT: Positive for congestion, postnasal drip, rhinorrhea, sinus pressure, sneezing and sore throat. Negative for ear pain.   Eyes: Negative for pain and discharge.  Respiratory: Positive for cough. Negative for shortness of breath, wheezing and stridor.   Cardiovascular: Negative for chest pain.  Gastrointestinal: Negative for diarrhea, nausea and vomiting.  Genitourinary: Negative for frequency, hematuria and urgency.  Musculoskeletal: Negative for arthralgias and myalgias.  Skin: Negative for rash.  Neurological: Positive for headaches. Negative for dizziness, weakness and light-headedness.  Psychiatric/Behavioral: Negative for confusion and  dysphoric mood.      Review of Systems      Objective:   Physical Exam  Constitutional: She appears well-developed and well-nourished. No distress.  obese and well appearing   HENT:  Head: Normocephalic and atraumatic.  Right Ear: External ear normal.  Left Ear: External ear normal.  Mouth/Throat: Oropharynx is clear and moist.  Nares are injected and congested  No sinus tenderness Throat is mildly injected  Clear rhinorrhea and post nasal drip   Eyes: Conjunctivae and EOM are normal. Pupils are equal, round, and reactive to light. Right eye exhibits no discharge. Left eye exhibits  no discharge.  Neck: Normal range of motion. Neck supple.  Cardiovascular: Normal rate and normal heart sounds.   Pulmonary/Chest: Effort normal and breath sounds normal. No respiratory distress. She has no wheezes. She has no rales. She exhibits no tenderness.  Lymphadenopathy:    She has no cervical adenopathy.  Neurological: She is alert.  Skin: Skin is warm and dry. No rash noted.  Psychiatric: She has a normal mood and affect.          Assessment & Plan:   Problem List Items Addressed This Visit      Respiratory   Viral URI    With sinus pressure and pnd and st Neg rst  Disc symptomatic care - see instructions on AVS  Update if not starting to improve in a week or if worsening    Disc s/s of bacterial infection to watch for         Other Visit Diagnoses    Sore throat    -  Primary   Relevant Orders   Rapid Strep A (Completed)

## 2016-05-17 NOTE — Assessment & Plan Note (Signed)
With sinus pressure and pnd and st Neg rst  Disc symptomatic care - see instructions on AVS  Update if not starting to improve in a week or if worsening    Disc s/s of bacterial infection to watch for

## 2016-07-18 ENCOUNTER — Telehealth: Payer: Self-pay | Admitting: Family Medicine

## 2016-07-18 NOTE — Telephone Encounter (Signed)
[facility]  Patient Name: Michele [name]  DOB: [birth year]    Initial Comment RECORD 1 OF 2 Caller says a skunk got in and bit their dog and was told that she and husband were exposed to rabies because they handled the dog or the skunk. The skunk is being tested. Wants to know what the protocol is for them   Nurse Assessment  Nurse: Wynetta Emery, RN, Baker Janus Date/Time Eilene Ghazi Time): 07/18/2016 5:04:55 PM  Confirm and document reason for call. If symptomatic, describe symptoms. ---Makana and David's dog got bit by a skunk in their yard on Saturday ==bit him on nose and paw; Shanon Brow handled the [employer] with gloves and Raynelle cleaned dog's wounds with gloves animal control was immediately contacted but [facility] and none to respond they are worried they were exposed to saliva from the skunk  Does the patient have any new or worsening symptoms? ---Yes  Will a triage be completed? ---Yes  Related visit to physician within the last 2 weeks? ---No  Does the PT have any chronic conditions? (i.e. diabetes, asthma, etc.) ---No  Is the patient pregnant or possibly pregnant? (Ask all females between the ages of 68-55) ---No  Is this a behavioral health or substance abuse call? ---No     Guidelines    Guideline Title Affirmed Question Affirmed Notes  Animal Bite [1] Any break in skin (e.g., cut, puncture or scratch) AND [2] wild animal at risk for RABIES (e.g., bat, raccoon, fox, skunk, coyote, other carnivores)    Final Disposition User   Go to ED Now Wynetta Emery, RN, Baker Janus    Comments  NOTE Nurse called her back and advised she would need to be seen by Department of Health tomorrow to see if they want to start administering the rabies vaccination.   Referrals  GO TO FACILITY UNDECIDED   Disagree/Comply: Comply

## 2016-07-19 ENCOUNTER — Other Ambulatory Visit: Payer: Self-pay | Admitting: Family Medicine

## 2016-07-21 ENCOUNTER — Emergency Department (HOSPITAL_COMMUNITY)
Admission: EM | Admit: 2016-07-21 | Discharge: 2016-07-21 | Disposition: A | Discharge status: Home / Self Care | Payer: Commercial Managed Care - PPO | Attending: Emergency Medicine | Admitting: Emergency Medicine

## 2016-07-21 ENCOUNTER — Encounter (HOSPITAL_COMMUNITY): Payer: Self-pay | Admitting: Emergency Medicine

## 2016-07-21 DIAGNOSIS — Z203 Contact with and (suspected) exposure to rabies: Secondary | ICD-10-CM | POA: Insufficient documentation

## 2016-07-21 DIAGNOSIS — Z87891 Personal history of nicotine dependence: Secondary | ICD-10-CM | POA: Diagnosis not present

## 2016-07-21 DIAGNOSIS — Z23 Encounter for immunization: Secondary | ICD-10-CM

## 2016-07-21 DIAGNOSIS — J45909 Unspecified asthma, uncomplicated: Secondary | ICD-10-CM | POA: Diagnosis not present

## 2016-07-21 MED ORDER — RABIES IMMUNE GLOBULIN 150 UNIT/ML IM INJ
20.0000 [IU]/kg | INJECTION | Freq: Once | INTRAMUSCULAR | Status: AC
  Administered 2016-07-21: 1875 [IU] via INTRAMUSCULAR
  Filled 2016-07-21: qty 12.5

## 2016-07-21 MED ORDER — RABIES VACCINE, PCEC IM SUSR
1.0000 mL | Freq: Once | INTRAMUSCULAR | Status: AC
  Administered 2016-07-21: 1 mL via INTRAMUSCULAR
  Filled 2016-07-21: qty 1

## 2016-07-21 NOTE — Discharge Instructions (Signed)
DAY 0:  07/21/2016      DAY 3:  07/24/2016       DAY 7:  07/28/2016     DAY 14:  08/04/2016

## 2016-07-21 NOTE — Telephone Encounter (Signed)
Thanks for letting me know - if they cannot tell her today then I would not put off the rabies shots much longer

## 2016-07-21 NOTE — Telephone Encounter (Signed)
appt scheduled and med refilled 

## 2016-07-21 NOTE — Telephone Encounter (Signed)
Pt said she got snowed in and animal control should have results today to see if skunk had rabies. Pt said she feels fine and her dog is OK also.FYI to Dr Glori Bickers.

## 2016-07-21 NOTE — Telephone Encounter (Signed)
Please schedule 30 min f/u with lab prior in march or April and refill until then Thanks

## 2016-07-21 NOTE — ED Provider Notes (Signed)
[facility] DEPT Provider Note   CSN: [id number] Arrival date & time: 07/21/16  1840     History   Chief Complaint Needs rabies vaccination   HPI   Blood pressure 130/81, pulse 77, temperature 98.2 F (36.8 C), temperature source Oral, resp. rate 17, height 5' 7.5" (1.715 m), weight 95.3 kg, SpO2 100 %.  Michele [name] is a 51 y.o. female resenting for rabies vaccination to request of her primary care. 3 days ago she found a skunk in her dogs pain. The skunk had bitten the dog. Her husband removed the skunk from the dog pen and the skunk was submitted for analysis to the state that for rabies. She cared for the dog at the time of his injury. There was no trauma or break in the skin, she was not scratched or bitten by the dog or the skunk as far she knows. There was a delay in coming in for rabies vaccination because she was unsure where to present. Patient with no complaints at this time.  Past Medical History:  Diagnosis Date  . Abnormal Pap smear of cervix    cryotx  . Anxiety   . Arthritis   . Asthma   . Depression   . Endometriosis   . Gastric polyp   . GERD (gastroesophageal reflux disease)   . Hemorrhoids   . Hiatal hernia   . HLD (hyperlipidemia)   . Hyperplastic colon polyp   . IBS (irritable bowel syndrome)   . Iron deficiency anemia 1/12  . Pneumonia   . Rotator cuff tear, left 2009  . Syncope   . Tachycardia    s/p negative cardiac work up with echo  . Uterine fibroid [id number]    Patient Active Problem List   Diagnosis Date Noted  . Viral URI 05/17/2016  . Nausea without vomiting 04/12/2016  . RUQ abdominal pain 04/04/2016  . Multiple joint pain 01/19/2016  . Myalgia 01/19/2016  . Rash and nonspecific skin eruption 01/19/2016  . Adjustment reaction with anxious mood 08/18/2015  . Arm paresthesia, right 07/14/2015  . Diarrhea 10/13/2014  . Rectal pressure 10/01/2013  . Rectocele 10/01/2013  . Painful intercourse 10/01/2013  . Skin tag 04/23/2013  .  Stress incontinence in female 04/23/2013  . Routine gynecological examination 04/01/2012  . Hyperglycemia 04/01/2012  . Routine general medical examination at a health care facility 03/11/2012  . GERD 08/26/2010  . History of anemia 07/19/2010  . FREQUENCY, URINARY 07/06/2010  . MICROSCOPIC HEMATURIA 06/09/2010  . History of colonic polyps 11/15/2007  . IBS 10/08/2007  . ENDOMETRIOSIS 10/08/2007  . Hyperlipidemia 10/30/2006  . Anxiety and depression 10/30/2006  . HEMORRHOIDS, INTERNAL 10/30/2006  . ASTHMA 10/30/2006  . MIGRAINES, HX OF 10/30/2006    Past Surgical History:  Procedure Laterality Date  . CARPAL TUNNEL RELEASE Bilateral   . COLONOSCOPY  1999   small polyps, hemms  . ESOPHAGOGASTRODUODENOSCOPY  1999  . ESOPHAGOGASTRODUODENOSCOPY  2/12   gastric polyp and HH  . HYSTEROSCOPY    . LAPAROSCOPY  2006   endometriosis  . ROTATOR CUFF REPAIR Left     OB History    Gravida Para Term Preterm AB Living   1 1 1     1    SAB TAB Ectopic Multiple Live Births                   Home Medications    Prior to Admission medications   Medication Sig Start Date End Date Taking? Authorizing Provider  albuterol (PROAIR HFA) 108 (90 Base) MCG/ACT inhaler Inhale 2 puffs into the lungs every 4 (four) hours as needed. 08/16/15   Abner Greenspan, MD  atorvastatin (LIPITOR) 20 MG tablet TAKE 1 TABLET DAILY 07/21/16   Abner Greenspan, MD  BIOTIN 5000 PO Take 1 tablet by mouth daily.    Historical Provider, MD  Cholecalciferol (VITAMIN D3 PO) Take 1 tablet by mouth daily.    Historical Provider, MD  cyclobenzaprine (FLEXERIL) 5 MG tablet Take 1 tablet (5 mg total) by mouth at bedtime as needed for muscle spasms (and neck stiffness). 08/26/15   Donika Keith Rake, DO  doxycycline (VIBRA-TABS) 100 MG tablet Take 1 tablet (100 mg total) by mouth 2 (two) times daily. 01/21/16   Abner Greenspan, MD  esomeprazole (NEXIUM) 40 MG capsule Take 1 capsule (40 mg total) by mouth 2 (two) times daily before a  meal. 04/14/16   Jerene Bears, MD  FLUoxetine (PROZAC) 40 MG capsule Take 1 capsule (40 mg total) by mouth daily. 02/08/16   Abner Greenspan, MD  fluticasone (FLONASE) 50 MCG/ACT nasal spray Place 2 sprays into both nostrils as needed. 08/16/15   Abner Greenspan, MD  gabapentin (NEURONTIN) 300 MG capsule Take 1 capsule (300 mg total) by mouth at bedtime. Patient taking differently: Take 300 mg by mouth at bedtime as needed.  08/26/15   Donika K Patel, DO  montelukast (SINGULAIR) 10 MG tablet Take 1 tablet (10 mg total) by mouth at bedtime. 02/08/16   Abner Greenspan, MD  Omega-3 Fatty Acids (OMEGA 3 PO) Take 1 capsule by mouth daily.    Historical Provider, MD  Prenatal Vit-Fe Fumarate-FA (PRENATAL PO) Take 1 tablet by mouth daily.    Historical Provider, MD  vitamin E (VITAMIN E) 400 UNIT capsule Take 2 capsules (800 Units total) by mouth daily. 04/14/16   Jerene Bears, MD    Family History Family History  Problem Relation Age of Onset  . Hypertension Father   . Diabetes Father   . Stroke Father   . Migraines Father   . Barrett's esophagus Father   . Anemia Father     iron def  . Heart disease Father     pacemaker, a fib, CHF  . Dementia Mother   . Diabetes Sister   . Hyperlipidemia Sister   . Neuropathy Daughter   . Anxiety disorder Daughter     Social History Social History  Substance Use Topics  . Smoking status: Former Smoker    Packs/day: 0.25    Years: 5.00    Types: Cigarettes    Quit date: 07/03/1992  . Smokeless tobacco: Never Used  . Alcohol use 0.0 oz/week     Comment: Rarely, once every few months     Allergies   Hydrocodone-acetaminophen; Nsaids; Povidone-iodine; and Propoxyphene n-acetaminophen   Review of Systems Review of Systems  10 systems reviewed and found to be negative, except as noted in the HPI.   Physical Exam Updated Vital Signs BP 130/81 (BP Location: Left Arm)   Pulse 77   Temp 98.2 F (36.8 C) (Oral)   Resp 17   Ht 5' 7.5" (1.715 m)   Wt  95.3 kg   SpO2 100%   BMI 32.41 kg/m   Physical Exam  Constitutional: She is oriented to person, place, and time. She appears well-developed and well-nourished. No distress.  HENT:  Head: Normocephalic and atraumatic.  Mouth/Throat: Oropharynx is clear and moist.  Eyes: Conjunctivae and  EOM are normal. Pupils are equal, round, and reactive to light.  Neck: Normal range of motion.  Cardiovascular: Normal rate, regular rhythm and intact distal pulses.   Pulmonary/Chest: Effort normal and breath sounds normal.  Abdominal: Soft. There is no tenderness.  Musculoskeletal: Normal range of motion.  Neurological: She is alert and oriented to person, place, and time.  Skin: She is not diaphoretic.  Psychiatric: She has a normal mood and affect.  Nursing note and vitals reviewed.    ED Treatments / Results  Labs (all labs ordered are listed, but only abnormal results are displayed) Labs Reviewed - No data to display  EKG  EKG Interpretation None       Radiology No results found.  Procedures Procedures (including critical care time)  Medications Ordered in ED Medications  rabies vaccine (RABAVERT) injection 1 mL (1 mL Intramuscular Given 07/21/16 2130)  rabies immune globulin (HYPERAB) injection 1,875 Units (1,875 Units Intramuscular Given 07/21/16 2129)     Initial Impression / Assessment and Plan / ED Course  I have reviewed the triage vital signs and the nursing notes.  Pertinent labs & imaging results that were available during my care of the patient were reviewed by me and considered in my medical decision making (see chart for details).     Vitals:   07/21/16 1905 07/21/16 2154  BP: (!) 136/108 130/81  Pulse: 90 77  Resp: 20 17  Temp: 98.2 F (36.8 C)   TempSrc: Oral   SpO2: 99% 100%  Weight: 95.3 kg   Height: 5' 7.5" (1.715 m)     Medications  rabies vaccine (RABAVERT) injection 1 mL (1 mL Intramuscular Given 07/21/16 2130)  rabies immune globulin  (HYPERAB) injection 1,875 Units (1,875 Units Intramuscular Given 07/21/16 2129)    Michele [name] is 51 y.o. female presenting withpresenting with request for rabies vaccination. He was exposed several days ago. There was no direct break in the skin however he did come into contact with the skunk who bit his dog. His dog is behaving normally, he is fully vaccinated. Patient is given rabies vaccine and IgG, I've counseled them on where to present for rabies booster shots and the timing of the boosters.   Evaluation does not show pathology that would require ongoing emergent intervention or inpatient treatment. Pt is hemodynamically stable and mentating appropriately. Discussed findings and plan with patient/guardian, who agrees with care plan. All questions answered. Return precautions discussed and outpatient follow up given.      Final Clinical Impressions(s) / ED Diagnoses   Final diagnoses:  Need for rabies vaccination    New Prescriptions New Prescriptions   No medications on file     Monico Blitz, PA-C 07/21/16 [place], MD 07/22/16 2251

## 2016-07-21 NOTE — Telephone Encounter (Signed)
Pt hasn't had lipid labs in over a year, please advise

## 2016-07-21 NOTE — Telephone Encounter (Signed)
Pt notified of Dr. Tower's comments and verbalized understanding  

## 2016-07-21 NOTE — ED Triage Notes (Signed)
Patient reports potential rabies exposure last Saturday when a skunk bit the patients dog. Patient reports she came in contact with the saliva of the dog and skunk. States she was wearing gloves but was referred here for rabies shots from her PCP. Denies any complaints.

## 2016-07-24 ENCOUNTER — Ambulatory Visit (HOSPITAL_COMMUNITY)
Admission: EM | Admit: 2016-07-24 | Discharge: 2016-07-24 | Disposition: A | Discharge status: Home / Self Care | Payer: Commercial Managed Care - PPO | Attending: Family Medicine | Admitting: Family Medicine

## 2016-07-24 ENCOUNTER — Encounter (HOSPITAL_COMMUNITY): Payer: Self-pay | Admitting: Emergency Medicine

## 2016-07-24 DIAGNOSIS — Z203 Contact with and (suspected) exposure to rabies: Secondary | ICD-10-CM

## 2016-07-24 MED ORDER — RABIES VACCINE, PCEC IM SUSR
1.0000 mL | Freq: Once | INTRAMUSCULAR | Status: AC
  Administered 2016-07-24: 1 mL via INTRAMUSCULAR

## 2016-07-24 MED ORDER — RABIES VACCINE, PCEC IM SUSR
INTRAMUSCULAR | Status: AC
  Filled 2016-07-24: qty 1

## 2016-07-24 NOTE — ED Triage Notes (Signed)
The patient presented to the [facility] to receive the Day 3 booster injection for the rabies series.

## 2016-07-24 NOTE — Discharge Instructions (Signed)
Please return to the [facility] on 07/28/2016 for your Day 7 rabies booster injection.

## 2016-07-28 ENCOUNTER — Ambulatory Visit (HOSPITAL_COMMUNITY)
Admission: EM | Admit: 2016-07-28 | Discharge: 2016-07-28 | Disposition: A | Discharge status: Home / Self Care | Payer: Commercial Managed Care - PPO | Attending: Family Medicine | Admitting: Family Medicine

## 2016-07-28 ENCOUNTER — Encounter (HOSPITAL_COMMUNITY): Payer: Self-pay | Admitting: *Deleted

## 2016-07-28 DIAGNOSIS — Z203 Contact with and (suspected) exposure to rabies: Secondary | ICD-10-CM

## 2016-07-28 MED ORDER — RABIES VACCINE, PCEC IM SUSR
INTRAMUSCULAR | Status: AC
  Filled 2016-07-28: qty 1

## 2016-07-28 MED ORDER — RABIES VACCINE, PCEC IM SUSR
1.0000 mL | Freq: Once | INTRAMUSCULAR | Status: AC
  Administered 2016-07-28: 1 mL via INTRAMUSCULAR

## 2016-07-28 NOTE — ED Triage Notes (Signed)
Pt  Presents  Here  Today    For    Day  7     Of  Rabies   Booster  series

## 2016-07-28 NOTE — Discharge Instructions (Signed)
Return in  1   Week  For  Next   In  Series     Of  Immunization   Sooner  If  Any  Problems

## 2016-07-31 ENCOUNTER — Other Ambulatory Visit: Payer: Self-pay | Admitting: Family Medicine

## 2016-07-31 DIAGNOSIS — Z1231 Encounter for screening mammogram for malignant neoplasm of breast: Secondary | ICD-10-CM

## 2016-08-04 ENCOUNTER — Encounter (HOSPITAL_COMMUNITY): Payer: Self-pay | Admitting: Emergency Medicine

## 2016-08-04 ENCOUNTER — Ambulatory Visit (HOSPITAL_COMMUNITY)
Admission: EM | Admit: 2016-08-04 | Discharge: 2016-08-04 | Disposition: A | Discharge status: Home / Self Care | Payer: Commercial Managed Care - PPO | Attending: Family Medicine | Admitting: Family Medicine

## 2016-08-04 DIAGNOSIS — Z203 Contact with and (suspected) exposure to rabies: Secondary | ICD-10-CM

## 2016-08-04 MED ORDER — RABIES VACCINE, PCEC IM SUSR
INTRAMUSCULAR | Status: AC
  Filled 2016-08-04: qty 1

## 2016-08-04 MED ORDER — RABIES VACCINE, PCEC IM SUSR
1.0000 mL | Freq: Once | INTRAMUSCULAR | Status: AC
  Administered 2016-08-04: 1 mL via INTRAMUSCULAR

## 2016-08-04 NOTE — ED Triage Notes (Signed)
Here for rabies vaccination day 14 (#4)  Voices no new concerns.  A&O x4... NAD

## 2016-08-06 ENCOUNTER — Other Ambulatory Visit: Payer: Self-pay | Admitting: Family Medicine

## 2016-08-06 DIAGNOSIS — J4520 Mild intermittent asthma, uncomplicated: Secondary | ICD-10-CM

## 2016-08-25 ENCOUNTER — Ambulatory Visit
Admission: RE | Admit: 2016-08-25 | Discharge: 2016-08-25 | Disposition: A | Discharge status: Home / Self Care | Payer: Commercial Managed Care - PPO | Source: Ambulatory Visit | Attending: Family Medicine | Admitting: Family Medicine

## 2016-08-25 ENCOUNTER — Encounter: Payer: Self-pay | Admitting: Radiology

## 2016-08-25 DIAGNOSIS — Z1231 Encounter for screening mammogram for malignant neoplasm of breast: Secondary | ICD-10-CM

## 2016-09-11 ENCOUNTER — Ambulatory Visit: Payer: Commercial Managed Care - PPO | Admitting: Family Medicine

## 2016-09-12 ENCOUNTER — Ambulatory Visit: Payer: Commercial Managed Care - PPO | Admitting: Family Medicine

## 2016-09-25 ENCOUNTER — Encounter: Payer: Self-pay | Admitting: Internal Medicine

## 2016-09-25 ENCOUNTER — Ambulatory Visit (INDEPENDENT_AMBULATORY_CARE_PROVIDER_SITE_OTHER): Payer: Commercial Managed Care - PPO | Admitting: Internal Medicine

## 2016-09-25 VITALS — BP 136/84 | HR 76 | Temp 98.1°F | Wt 212.0 lb

## 2016-09-25 DIAGNOSIS — J301 Allergic rhinitis due to pollen: Secondary | ICD-10-CM | POA: Diagnosis not present

## 2016-09-25 MED ORDER — BENZONATATE 200 MG PO CAPS: 200.0000 mg | ORAL_CAPSULE | Freq: Three times a day (TID) | ORAL | 0 refills | Status: DC | PRN

## 2016-09-25 MED ORDER — PROMETHAZINE-DM 6.25-15 MG/5ML PO SYRP: 5.0000 mL | ORAL_SOLUTION | Freq: Four times a day (QID) | ORAL | 0 refills | Status: DC | PRN

## 2016-09-25 MED ORDER — FLUTICASONE PROPIONATE 50 MCG/ACT NA SUSP: 2.0000 | NASAL | 5 refills | Status: DC | PRN

## 2016-09-25 NOTE — Progress Notes (Signed)
HPI  Pt presents to the clinic today with c/o headache, facial pain and pressure, sneezing, runny nose and cough. This started 1 month ago. She is blowing clear mucous out of her nose. The cough is productive of white mucous. She denies fever, chills or body aches. She has tried Mucinex DM, Aleve and Singulair with minimal relief. She does have a history of asthma. Her flu shot is UTD. She has not had sick contacts that she is aware of.   Review of Systems     Past Medical History:  Diagnosis Date  . Abnormal Pap smear of cervix    cryotx  . Anxiety   . Arthritis   . Asthma   . Depression   . Endometriosis   . Gastric polyp   . GERD (gastroesophageal reflux disease)   . Hemorrhoids   . Hiatal hernia   . HLD (hyperlipidemia)   . Hyperplastic colon polyp   . IBS (irritable bowel syndrome)   . Iron deficiency anemia 1/12  . Pneumonia   . Rotator cuff tear, left 2009  . Syncope   . Tachycardia    s/p negative cardiac work up with echo  . Uterine fibroid [id number]    Family History  Problem Relation Age of Onset  . Hypertension Father   . Diabetes Father   . Stroke Father   . Migraines Father   . Barrett's esophagus Father   . Anemia Father     iron def  . Heart disease Father     pacemaker, a fib, CHF  . Dementia Mother   . Diabetes Sister   . Hyperlipidemia Sister   . Neuropathy Daughter   . Anxiety disorder Daughter     Social History   Social History  . Marital status: Married    Spouse name: N/A  . Number of children: 1  . Years of education: N/A   Occupational History  . Store Freight forwarder    Social History Main Topics  . Smoking status: Former Smoker    Packs/day: 0.25    Years: 5.00    Types: Cigarettes    Quit date: 07/03/1992  . Smokeless tobacco: Never Used  . Alcohol use 0.0 oz/week     Comment: Rarely, once every few months  . Drug use: No  . Sexual activity: Yes    Partners: Male    Birth control/ protection: None   Other Topics Concern  .  Not on file   Social History Narrative   She lives with husband.   She works as a Dance movement psychotherapist.   Highest level of education:  Some college    Allergies  Allergen Reactions  . Hydrocodone-Acetaminophen Itching  . Nsaids Nausea And Vomiting       . Povidone-Iodine Rash       . Propoxyphene N-Acetaminophen Itching          Constitutional: Positive headache. Denies fatigue, fever or abrupt weight changes.  HEENT:  Positive facial pain and runny nose. Denies eye redness, ear pain, ringing in the ears, wax buildup, nasal congestion or sore throat. Respiratory: Positive cough. Denies difficulty breathing or shortness of breath.  Cardiovascular: Denies chest pain, chest tightness, palpitations or swelling in the hands or feet.   No other specific complaints in a complete review of systems (except as listed in HPI above).  Objective:   BP 136/84   Pulse 76   Temp 98.1 F (36.7 C) (Oral)   Wt 212 lb (96.2 kg)   LMP  03/06/2014   SpO2 97%   BMI 32.71 kg/m    General: Appears her stated age, in NAD. HEENT: Head: normal shape and size, no sinus tenderness noted; Eyes: sclera white, no icterus, conjunctiva pink; Ears: Tm's pink but intact, normal light reflex; Nose: mucosa boggy and moist, septum midline; Throat/Mouth: + PND. Teeth present, mucosa erythematous and moist, no exudate noted, no lesions or ulcerations noted.  Neck:  No adenopathy noted.  Cardiovascular: Normal rate and rhythm. S1,S2 noted.  No murmur, rubs or gallops noted.  Pulmonary/Chest: Normal effort and positive vesicular breath sounds. No respiratory distress. No wheezes, rales or ronchi noted.       Assessment & Plan:   Allergic Rhinitis  80 mg Depo IM today Continue Singular Start Zyrtec OTC Flonase 2 sprays each nostril for 3 days and then as needed. eRx for Benzonate 200 mg TID for 10 days  RTC as needed or if symptoms persist. Webb Silversmith, NP

## 2016-09-25 NOTE — Patient Instructions (Signed)
Allergic Rhinitis Allergic rhinitis is when the mucous membranes in the nose respond to allergens. Allergens are particles in the air that cause your body to have an allergic reaction. This causes you to release allergic antibodies. Through a chain of events, these eventually cause you to release histamine into the blood stream. Although meant to protect the body, it is this release of histamine that causes your discomfort, such as frequent sneezing, congestion, and an itchy, runny nose. What are the causes? Seasonal allergic rhinitis (hay fever) is caused by pollen allergens that may come from grasses, trees, and weeds. Year-round allergic rhinitis (perennial allergic rhinitis) is caused by allergens such as house dust mites, pet dander, and mold spores. What are the signs or symptoms?  Nasal stuffiness (congestion).  Itchy, runny nose with sneezing and tearing of the eyes. How is this diagnosed? Your health care provider can help you determine the allergen or allergens that trigger your symptoms. If you and your health care provider are unable to determine the allergen, skin or blood testing may be used. Your health care provider will diagnose your condition after taking your health history and performing a physical exam. Your health care provider may assess you for other related conditions, such as asthma, pink eye, or an ear infection. How is this treated? Allergic rhinitis does not have a cure, but it can be controlled by:  Medicines that block allergy symptoms. These may include allergy shots, nasal sprays, and oral antihistamines.  Avoiding the allergen. Hay fever may often be treated with antihistamines in pill or nasal spray forms. Antihistamines block the effects of histamine. There are over-the-counter medicines that may help with nasal congestion and swelling around the eyes. Check with your health care provider before taking or giving this medicine. If avoiding the allergen or the  medicine prescribed do not work, there are many new medicines your health care provider can prescribe. Stronger medicine may be used if initial measures are ineffective. Desensitizing injections can be used if medicine and avoidance does not work. Desensitization is when a patient is given ongoing shots until the body becomes less sensitive to the allergen. Make sure you follow up with your health care provider if problems continue. Follow these instructions at home: It is not possible to completely avoid allergens, but you can reduce your symptoms by taking steps to limit your exposure to them. It helps to know exactly what you are allergic to so that you can avoid your specific triggers. Contact a health care provider if:  You have a fever.  You develop a cough that does not stop easily (persistent).  You have shortness of breath.  You start wheezing.  Symptoms interfere with normal daily activities. This information is not intended to replace advice given to you by your health care provider. Make sure you discuss any questions you have with your health care provider. Document Released: 03/14/2001 Document Revised: 02/18/2016 Document Reviewed: 02/24/2013 Elsevier Interactive Patient Education  2017 [employer]vier Inc.  

## 2016-09-26 ENCOUNTER — Ambulatory Visit: Payer: Self-pay | Admitting: Family Medicine

## 2016-09-27 MED ORDER — METHYLPREDNISOLONE ACETATE 80 MG/ML IJ SUSP
80.0000 mg | Freq: Once | INTRAMUSCULAR | Status: AC
  Administered 2016-09-25: 80 mg via INTRAMUSCULAR

## 2016-09-27 NOTE — Addendum Note (Signed)
Addended by: Lurlean Nanny on: 09/27/2016 11:46 AM   Modules accepted: Orders

## 2016-10-05 ENCOUNTER — Telehealth: Payer: Self-pay | Admitting: Family Medicine

## 2016-10-05 DIAGNOSIS — R739 Hyperglycemia, unspecified: Secondary | ICD-10-CM

## 2016-10-05 DIAGNOSIS — Z862 Personal history of diseases of the blood and blood-forming organs and certain disorders involving the immune mechanism: Secondary | ICD-10-CM

## 2016-10-05 DIAGNOSIS — E7800 Pure hypercholesterolemia, unspecified: Secondary | ICD-10-CM

## 2016-10-05 NOTE — Telephone Encounter (Signed)
-----   Message from Ellamae Sia sent at 10/04/2016 12:13 PM EDT ----- Regarding: Lab orders for Thursday, 4.19.18 Lab orders for a f/u  appt

## 2016-10-08 NOTE — Telephone Encounter (Signed)
-----   Message from Ellamae Sia sent at 10/04/2016 12:13 PM EDT ----- Regarding: Lab orders for Thursday, 4.19.18 Lab orders for a f/u  appt

## 2016-10-09 ENCOUNTER — Ambulatory Visit: Payer: Commercial Managed Care - PPO | Admitting: Family Medicine

## 2016-10-19 ENCOUNTER — Other Ambulatory Visit: Payer: Commercial Managed Care - PPO

## 2016-10-23 ENCOUNTER — Ambulatory Visit: Payer: Commercial Managed Care - PPO | Admitting: Family Medicine

## 2016-11-04 ENCOUNTER — Other Ambulatory Visit: Payer: Self-pay | Admitting: Family Medicine

## 2016-11-04 DIAGNOSIS — J4520 Mild intermittent asthma, uncomplicated: Secondary | ICD-10-CM

## 2016-11-06 NOTE — Telephone Encounter (Signed)
Not recent/future f/u or CPE, please advise

## 2016-11-06 NOTE — Telephone Encounter (Signed)
Please schedule PE or f/u in about 6 mo and refill until then Thanks

## 2016-11-07 NOTE — Telephone Encounter (Signed)
Pt has an appt with Tower on 11/15/16, will schedule a f/u or CPE then

## 2016-11-15 ENCOUNTER — Encounter: Payer: Self-pay | Admitting: Family Medicine

## 2016-11-15 ENCOUNTER — Ambulatory Visit (INDEPENDENT_AMBULATORY_CARE_PROVIDER_SITE_OTHER): Payer: BLUE CROSS/BLUE SHIELD | Admitting: Family Medicine

## 2016-11-15 VITALS — BP 122/66 | HR 75 | Temp 98.4°F | Ht 67.75 in | Wt 211.8 lb

## 2016-11-15 DIAGNOSIS — F329 Major depressive disorder, single episode, unspecified: Secondary | ICD-10-CM

## 2016-11-15 DIAGNOSIS — R3915 Urgency of urination: Secondary | ICD-10-CM

## 2016-11-15 DIAGNOSIS — N3946 Mixed incontinence: Secondary | ICD-10-CM

## 2016-11-15 DIAGNOSIS — W57XXXA Bitten or stung by nonvenomous insect and other nonvenomous arthropods, initial encounter: Secondary | ICD-10-CM | POA: Diagnosis not present

## 2016-11-15 DIAGNOSIS — F419 Anxiety disorder, unspecified: Secondary | ICD-10-CM

## 2016-11-15 DIAGNOSIS — S30861A Insect bite (nonvenomous) of abdominal wall, initial encounter: Secondary | ICD-10-CM | POA: Insufficient documentation

## 2016-11-15 DIAGNOSIS — F32A Depression, unspecified: Secondary | ICD-10-CM

## 2016-11-15 LAB — POC URINALSYSI DIPSTICK (AUTOMATED)
Bilirubin, UA: NEGATIVE
Glucose, UA: 250
Ketones, UA: NEGATIVE
Leukocytes, UA: NEGATIVE
NITRITE UA: NEGATIVE
PROTEIN UA: NEGATIVE
RBC UA: NEGATIVE
SPEC GRAV UA: 1.025 (ref 1.010–1.025)
UROBILINOGEN UA: 0.2 U/dL
pH, UA: 6 (ref 5.0–8.0)

## 2016-11-15 MED ORDER — SOLIFENACIN SUCCINATE 10 MG PO TABS: 10.0000 mg | ORAL_TABLET | Freq: Every day | ORAL | 11 refills | Status: DC

## 2016-11-15 MED ORDER — ESTRADIOL 0.1 MG/GM VA CREA: TOPICAL_CREAM | VAGINAL | 11 refills | Status: DC

## 2016-11-15 MED ORDER — FLUOXETINE HCL 20 MG PO TABS: 60.0000 mg | ORAL_TABLET | Freq: Every day | ORAL | 11 refills | Status: DC

## 2016-11-15 NOTE — Patient Instructions (Addendum)
You should be able to get vitamin E over the counter- 400 iu (you would take 2 daily)  Try the estrogen cream three times weekly  Try the vesicare for bladder control once daily  Increase fluoxetine (prozac) to 60 mg daily   Follow up in 6-8 weeks   Keep tick bite clean with soap and water- alert [place] if it changes

## 2016-11-15 NOTE — Progress Notes (Signed)
Subjective:    Patient ID: Michele [name], female    DOB: August 16, 1965, 51 y.o.   MRN: [id number]  HPI Here for bladder issues   Hx of stress incontinence in the past Worse now with sneeze and cough  Also urge incontinence  Wears a pad all the time   Has done kegel exercises /not as much as she should    Cystocele and rectocele  Has not had a repair -not severe enough    No burning  No blood in urine   A lot of vaginal dryness Intercourse is painful   Sees Dr Hulan Fray for gyn Pap was 2016      Results for orders placed or performed in visit on 11/15/16  POCT Urinalysis Dipstick (Automated)  Result Value Ref Range   Color, UA Yellow    Clarity, UA Clear    Glucose, UA 250 mg/dL    Bilirubin, UA Negative    Ketones, UA Negative    Spec Grav, UA 1.025 1.010 - 1.025   Blood, UA Negative    pH, UA 6.0 5.0 - 8.0   Protein, UA Negative    Urobilinogen, UA 0.2 0.2 or 1.0 E.U./dL   Nitrite, UA Negative    Leukocytes, UA Negative Negative     Also wanted to take vit E Wants to know if she can get it otc   Also has a tick bite on L mid abdomen from the 10th  Still a small itchy red spot No other symptoms   Worse depression lately  On fluoxetine  Mixed sympt-dep and anx  Caring for mother with alz-tough on her    Patient Active Problem List   Diagnosis Date Noted  . Tick bite of abdomen 11/15/2016  . Nausea without vomiting 04/12/2016  . RUQ abdominal pain 04/04/2016  . Multiple joint pain 01/19/2016  . Myalgia 01/19/2016  . Adjustment reaction with anxious mood 08/18/2015  . Arm paresthesia, right 07/14/2015  . Diarrhea 10/13/2014  . Rectal pressure 10/01/2013  . Rectocele 10/01/2013  . Painful intercourse 10/01/2013  . Skin tag 04/23/2013  . Mixed incontinence 04/23/2013  . Routine gynecological examination 04/01/2012  . Hyperglycemia 04/01/2012  . Routine general medical examination at a health care facility 03/11/2012  . GERD 08/26/2010  . History of  anemia 07/19/2010  . FREQUENCY, URINARY 07/06/2010  . MICROSCOPIC HEMATURIA 06/09/2010  . History of colonic polyps 11/15/2007  . IBS 10/08/2007  . ENDOMETRIOSIS 10/08/2007  . Hyperlipidemia 10/30/2006  . Anxiety and depression 10/30/2006  . HEMORRHOIDS, INTERNAL 10/30/2006  . ASTHMA 10/30/2006  . MIGRAINES, HX OF 10/30/2006   Past Medical History:  Diagnosis Date  . Abnormal Pap smear of cervix    cryotx  . Anxiety   . Arthritis   . Asthma   . Depression   . Endometriosis   . Gastric polyp   . GERD (gastroesophageal reflux disease)   . Hemorrhoids   . Hiatal hernia   . HLD (hyperlipidemia)   . Hyperplastic colon polyp   . IBS (irritable bowel syndrome)   . Iron deficiency anemia 1/12  . Pneumonia   . Rotator cuff tear, left 2009  . Syncope   . Tachycardia    s/p negative cardiac work up with echo  . Uterine fibroid [id number]   Past Surgical History:  Procedure Laterality Date  . CARPAL TUNNEL RELEASE Bilateral   . COLONOSCOPY  1999   small polyps, hemms  . ESOPHAGOGASTRODUODENOSCOPY  1999  . ESOPHAGOGASTRODUODENOSCOPY  2/12  gastric polyp and HH  . HYSTEROSCOPY    . LAPAROSCOPY  2006   endometriosis  . ROTATOR CUFF REPAIR Left    Social History  Substance Use Topics  . Smoking status: Former Smoker    Packs/day: 0.25    Years: 5.00    Types: Cigarettes    Quit date: 07/03/1992  . Smokeless tobacco: Never Used  . Alcohol use 0.0 oz/week     Comment: Rarely, once every few months   Family History  Problem Relation Age of Onset  . Hypertension Father   . Diabetes Father   . Stroke Father   . Migraines Father   . Barrett's esophagus Father   . Anemia Father        iron def  . Heart disease Father        pacemaker, a fib, CHF  . Dementia Mother   . Diabetes Sister   . Hyperlipidemia Sister   . Neuropathy Daughter   . Anxiety disorder Daughter    Allergies  Allergen Reactions  . Hydrocodone-Acetaminophen Itching  . Nsaids Nausea And Vomiting        . Povidone-Iodine Rash       . Propoxyphene N-Acetaminophen Itching        Current Outpatient Prescriptions on File Prior to Visit  Medication Sig Dispense Refill  . albuterol (PROAIR HFA) 108 (90 Base) MCG/ACT inhaler Inhale 2 puffs into the lungs every 4 (four) hours as needed. 3 Inhaler 2  . atorvastatin (LIPITOR) 20 MG tablet TAKE 1 TABLET DAILY 90 tablet 1  . benzonatate (TESSALON) 200 MG capsule Take 1 capsule (200 mg total) by mouth 3 (three) times daily as needed for cough. 30 capsule 0  . BIOTIN 5000 PO Take 1 tablet by mouth daily.    . Cholecalciferol (VITAMIN D3 PO) Take 1 tablet by mouth daily.    Marland Kitchen esomeprazole (NEXIUM) 40 MG capsule TAKE 1 CAPSULE EVERY MORNING 90 capsule 0  . FLUoxetine (PROZAC) 40 MG capsule TAKE 1 CAPSULE DAILY 90 capsule 0  . fluticasone (FLONASE) 50 MCG/ACT nasal spray Place 2 sprays into both nostrils as needed. 16 g 5  . montelukast (SINGULAIR) 10 MG tablet TAKE 1 TABLET AT BEDTIME 90 tablet 0  . Omega-3 Fatty Acids (OMEGA 3 PO) Take 1 capsule by mouth daily.    . Prenatal Vit-Fe Fumarate-FA (PRENATAL PO) Take 1 tablet by mouth daily.    . vitamin E (VITAMIN E) 400 UNIT capsule Take 2 capsules (800 Units total) by mouth daily. 60 capsule 3  . [DISCONTINUED] norethindrone-ethinyl estradiol-iron (MICROGESTIN FE1.5/30) 1.5-30 MG-MCG tablet Take 1 tablet by mouth daily.       No current facility-administered medications on file prior to visit.     Review of Systems Review of Systems  Constitutional: Negative for fever, appetite change, fatigue and unexpected weight change.  Eyes: Negative for pain and visual disturbance.  Respiratory: Negative for cough and shortness of breath.   Cardiovascular: Negative for cp or palpitations    Gastrointestinal: Negative for nausea, diarrhea and constipation.  Genitourinary: pos for incontinence and  for urgency and frequency. neg for hematuria or dysuria  Skin: Negative for pallor or rash   Neurological:  Negative for weakness, light-headedness, numbness and headaches.  Hematological: Negative for adenopathy. Does not bruise/bleed easily.  Psychiatric/Behavioral: pos for anx and dysphoric mood w/o SI , pos for caregiver stress      Objective:   Physical Exam  Constitutional: She appears well-developed and well-nourished. No distress.  obese and well appearing   HENT:  Head: Normocephalic and atraumatic.  Eyes: Conjunctivae and EOM are normal. Pupils are equal, round, and reactive to light.  Neck: Normal range of motion. Neck supple.  Cardiovascular: Normal rate, regular rhythm and normal heart sounds.   Pulmonary/Chest: Effort normal and breath sounds normal.  Abdominal: Soft. Bowel sounds are normal. She exhibits no distension. There is no tenderness. There is no rebound.  No cva tenderness or fullness  No suprapubic tenderness  Musculoskeletal: She exhibits no edema.  Lymphadenopathy:    She has no cervical adenopathy.  Neurological: She is alert.  Skin: No rash noted.  1-2 mm erythematous flat spot on L abdomen  Psychiatric: Her speech is normal and behavior is normal. Thought content normal. Her mood appears anxious. Her affect is not blunt, not labile and not inappropriate. Cognition and memory are normal. She exhibits a depressed mood.          Assessment & Plan:   Problem List Items Addressed This Visit      Musculoskeletal and Integument   Tick bite of abdomen    1-2 mm erythematous spot where tick was found a week ago on L mid abd No other symptoms  No rash  Will update if this changes or new symptoms occur        Other   Anxiety and depression    Worse lately with caregiver stress  Increase fluoxetine to 60 mg daily  Discussed expectations of SSRI medication including time to effectiveness and mechanism of action, also poss of side effects (early and late)- including mental fuzziness, weight or appetite change, nausea and poss of worse dep or anxiety (even  suicidal thoughts)  Pt voiced understanding and will stop med and update if this occurs   Update if not helpful Counseling offered Reviewed stressors/ coping techniques/symptoms/ support sources/ tx options and side effects in detail today       Mixed incontinence    With symptoms of atrophic vaginitis as well  Trial of vesicare 10 mg daily -disc poss side eff  Also estrace cream 3 times weekly - 1 cm of cream in vagina and on external vulva /urethra F/u planned ua reviewed  Update if side eff      Relevant Medications   solifenacin (VESICARE) 10 MG tablet    Other Visit Diagnoses    Urinary urgency    -  Primary   Relevant Orders   POCT Urinalysis Dipstick (Automated) (Completed)

## 2016-11-16 ENCOUNTER — Telehealth: Payer: Self-pay | Admitting: *Deleted

## 2016-11-16 MED ORDER — TOLTERODINE TARTRATE ER 4 MG PO CP24: 4.0000 mg | ORAL_CAPSULE | Freq: Every day | ORAL | 11 refills | Status: DC

## 2016-11-16 NOTE — Telephone Encounter (Signed)
I sent the generic detrol LA to her pharmacy-please let her know we will do that instead

## 2016-11-16 NOTE — Assessment & Plan Note (Signed)
Worse lately with caregiver stress  Increase fluoxetine to 60 mg daily  Discussed expectations of SSRI medication including time to effectiveness and mechanism of action, also poss of side effects (early and late)- including mental fuzziness, weight or appetite change, nausea and poss of worse dep or anxiety (even suicidal thoughts)  Pt voiced understanding and will stop med and update if this occurs   Update if not helpful Counseling offered Reviewed stressors/ coping techniques/symptoms/ support sources/ tx options and side effects in detail today

## 2016-11-16 NOTE — Assessment & Plan Note (Signed)
1-2 mm erythematous spot where tick was found a week ago on L mid abd No other symptoms  No rash  Will update if this changes or new symptoms occur

## 2016-11-16 NOTE — Telephone Encounter (Signed)
Left voicemail letting pt know of the change

## 2016-11-16 NOTE — Assessment & Plan Note (Signed)
With symptoms of atrophic vaginitis as well  Trial of vesicare 10 mg daily -disc poss side eff  Also estrace cream 3 times weekly - 1 cm of cream in vagina and on external vulva /urethra F/u planned ua reviewed  Update if side eff

## 2016-11-16 NOTE — Telephone Encounter (Signed)
PA received for pt's vesicare. Went to [email or web address] and it says that pt should have tried and failed oxybutynin or oxybutynin ER, or Tolterodine or Tolterodine ER, if not a new Rx for one of these 4 meds should be sent in 1st. I don't see any of these meds on her past med list, please advise if you want me to proceed with PA or would you like to switch to one of the covered meds?

## 2017-01-10 ENCOUNTER — Encounter: Payer: Self-pay | Admitting: Family Medicine

## 2017-01-10 ENCOUNTER — Ambulatory Visit (INDEPENDENT_AMBULATORY_CARE_PROVIDER_SITE_OTHER): Payer: BLUE CROSS/BLUE SHIELD | Admitting: Family Medicine

## 2017-01-10 VITALS — BP 128/70 | HR 74 | Temp 98.3°F | Ht 67.75 in | Wt 211.5 lb

## 2017-01-10 DIAGNOSIS — N3946 Mixed incontinence: Secondary | ICD-10-CM

## 2017-01-10 DIAGNOSIS — J4520 Mild intermittent asthma, uncomplicated: Secondary | ICD-10-CM

## 2017-01-10 DIAGNOSIS — F32A Depression, unspecified: Secondary | ICD-10-CM

## 2017-01-10 DIAGNOSIS — R739 Hyperglycemia, unspecified: Secondary | ICD-10-CM | POA: Diagnosis not present

## 2017-01-10 DIAGNOSIS — Z862 Personal history of diseases of the blood and blood-forming organs and certain disorders involving the immune mechanism: Secondary | ICD-10-CM | POA: Diagnosis not present

## 2017-01-10 DIAGNOSIS — E7800 Pure hypercholesterolemia, unspecified: Secondary | ICD-10-CM | POA: Diagnosis not present

## 2017-01-10 DIAGNOSIS — F419 Anxiety disorder, unspecified: Secondary | ICD-10-CM

## 2017-01-10 DIAGNOSIS — J309 Allergic rhinitis, unspecified: Secondary | ICD-10-CM | POA: Insufficient documentation

## 2017-01-10 DIAGNOSIS — J302 Other seasonal allergic rhinitis: Secondary | ICD-10-CM | POA: Diagnosis not present

## 2017-01-10 DIAGNOSIS — F329 Major depressive disorder, single episode, unspecified: Secondary | ICD-10-CM | POA: Diagnosis not present

## 2017-01-10 LAB — LIPID PANEL
CHOL/HDL RATIO: 3
Cholesterol: 160 mg/dL (ref 0–200)
HDL: 55.7 mg/dL (ref >39.00)
LDL CALC: 81 mg/dL (ref 0–99)
NonHDL: 104.63
TRIGLYCERIDES: 119 mg/dL (ref 0.0–149.0)
VLDL: 23.8 mg/dL (ref 0.0–40.0)

## 2017-01-10 LAB — COMPREHENSIVE METABOLIC PANEL
ALT: 38 U/L — ABNORMAL HIGH (ref 0–35)
AST: 24 U/L (ref 0–37)
Albumin: 4.1 g/dL (ref 3.5–5.2)
Alkaline Phosphatase: 130 U/L — ABNORMAL HIGH (ref 39–117)
BUN: 7 mg/dL (ref 6–23)
CHLORIDE: 105 meq/L (ref 96–112)
CO2: 29 meq/L (ref 19–32)
Calcium: 9.4 mg/dL (ref 8.4–10.5)
Creatinine, Ser: 0.8 mg/dL (ref 0.40–1.20)
GFR: 80.04 mL/min (ref >60.00)
GLUCOSE: 168 mg/dL — AB (ref 70–99)
POTASSIUM: 4.7 meq/L (ref 3.5–5.1)
SODIUM: 141 meq/L (ref 135–145)
TOTAL PROTEIN: 6.8 g/dL (ref 6.0–8.3)
Total Bilirubin: 0.4 mg/dL (ref 0.2–1.2)

## 2017-01-10 LAB — CBC WITH DIFFERENTIAL/PLATELET
BASOS PCT: 0.9 % (ref 0.0–3.0)
Basophils Absolute: 0.1 10*3/uL (ref 0.0–0.1)
EOS ABS: 0.2 10*3/uL (ref 0.0–0.7)
EOS PCT: 4.1 % (ref 0.0–5.0)
HEMATOCRIT: 40.9 % (ref 36.0–46.0)
HEMOGLOBIN: 13.8 g/dL (ref 12.0–15.0)
Lymphocytes Relative: 32.5 % (ref 12.0–46.0)
Lymphs Abs: 2 10*3/uL (ref 0.7–4.0)
MCHC: 33.7 g/dL (ref 30.0–36.0)
MCV: 89.6 fl (ref 78.0–100.0)
MONO ABS: 0.4 10*3/uL (ref 0.1–1.0)
Monocytes Relative: 6.9 % (ref 3.0–12.0)
NEUTROS ABS: 3.4 10*3/uL (ref 1.4–7.7)
Neutrophils Relative %: 55.6 % (ref 43.0–77.0)
PLATELETS: 305 10*3/uL (ref 150.0–400.0)
RBC: 4.56 Mil/uL (ref 3.87–5.11)
RDW: 13.5 % (ref 11.5–15.5)
WBC: 6 10*3/uL (ref 4.0–10.5)

## 2017-01-10 LAB — TSH: TSH: 3.01 u[IU]/mL (ref 0.35–4.50)

## 2017-01-10 LAB — HEMOGLOBIN A1C: HEMOGLOBIN A1C: 6 % (ref 4.6–6.5)

## 2017-01-10 MED ORDER — BUSPIRONE HCL 15 MG PO TABS: 15.0000 mg | ORAL_TABLET | Freq: Two times a day (BID) | ORAL | 5 refills | Status: DC

## 2017-01-10 MED ORDER — TOLTERODINE TARTRATE ER 4 MG PO CP24: 4.0000 mg | ORAL_CAPSULE | Freq: Every day | ORAL | 11 refills | Status: DC

## 2017-01-10 NOTE — Assessment & Plan Note (Signed)
Pt never started detrol because pharmacy claimed "not to get it" - it was sent again  Also estrogen cream-try  Update with progress

## 2017-01-10 NOTE — Progress Notes (Signed)
Subjective:    Patient ID: Michele [name], female    DOB: 1965/07/25, 51 y.o.   MRN: [id number]  HPI Here for f/u of several issues incl bladder control and depression   Also notes asthma is worse lately  Has not seen an allergist for a while-wants a ref  On singulair  Albuterol as needed - occ / does not like to take it  flonase for allergies (worse lately also)  Weather change is a big trigger for her / also humidity  Gets wheezing and bronchial cough   Wt Readings from Last 3 Encounters:  01/10/17 211 lb 8 oz (95.9 kg)  11/15/16 211 lb 12 oz (96 kg)  09/25/16 212 lb (96.2 kg)   bmi 32.4  Started on vesicare and estrogen cream for mixed incontinence (changed to detrol for coverage) Pharmacy claims they never got the detrol (our chart says that it went through)  She has not tried the estrogen cream yet    Inc fluoxetine to 60 mg for dep/anx -no improvement  Flip flops between anx /dep and jitteriness No SI  Found out her father was diagnosed with bipolar / she may have other family members with it  (daughter)   Family issues and the state of the world overwhelm her  Hard to make dec No motivation to get up and do  Talks herself out of doing things  Not wanting to be social in general  Anxious and irritable/ poor sleep  Busy brain   Self care- she has been taking her dog for more frequent walks and getting out Talks to a friend   Lab Results  Component Value Date   TSH 1.89 01/19/2016    Due for lab for lipid and anemia as well   Patient Active Problem List   Diagnosis Date Noted  . Allergic rhinitis 01/10/2017  . Nausea without vomiting 04/12/2016  . RUQ abdominal pain 04/04/2016  . Multiple joint pain 01/19/2016  . Myalgia 01/19/2016  . Adjustment reaction with anxious mood 08/18/2015  . Arm paresthesia, right 07/14/2015  . Diarrhea 10/13/2014  . Rectal pressure 10/01/2013  . Rectocele 10/01/2013  . Painful intercourse 10/01/2013  . Skin tag 04/23/2013    . Mixed incontinence 04/23/2013  . Routine gynecological examination 04/01/2012  . Hyperglycemia 04/01/2012  . Routine general medical examination at a health care facility 03/11/2012  . GERD 08/26/2010  . History of anemia 07/19/2010  . FREQUENCY, URINARY 07/06/2010  . MICROSCOPIC HEMATURIA 06/09/2010  . History of colonic polyps 11/15/2007  . IBS 10/08/2007  . ENDOMETRIOSIS 10/08/2007  . Hyperlipidemia 10/30/2006  . Anxiety and depression 10/30/2006  . HEMORRHOIDS, INTERNAL 10/30/2006  . Asthma 10/30/2006  . MIGRAINES, HX OF 10/30/2006   Past Medical History:  Diagnosis Date  . Abnormal Pap smear of cervix    cryotx  . Anxiety   . Arthritis   . Asthma   . Depression   . Endometriosis   . Gastric polyp   . GERD (gastroesophageal reflux disease)   . Hemorrhoids   . Hiatal hernia   . HLD (hyperlipidemia)   . Hyperplastic colon polyp   . IBS (irritable bowel syndrome)   . Iron deficiency anemia 1/12  . Pneumonia   . Rotator cuff tear, left 2009  . Syncope   . Tachycardia    s/p negative cardiac work up with echo  . Uterine fibroid [id number]   Past Surgical History:  Procedure Laterality Date  . CARPAL TUNNEL RELEASE Bilateral   .  COLONOSCOPY  1999   small polyps, hemms  . ESOPHAGOGASTRODUODENOSCOPY  1999  . ESOPHAGOGASTRODUODENOSCOPY  2/12   gastric polyp and HH  . HYSTEROSCOPY    . LAPAROSCOPY  2006   endometriosis  . ROTATOR CUFF REPAIR Left    Social History  Substance Use Topics  . Smoking status: Former Smoker    Packs/day: 0.25    Years: 5.00    Types: Cigarettes    Quit date: 07/03/1992  . Smokeless tobacco: Never Used  . Alcohol use 0.0 oz/week     Comment: Rarely, once every few months   Family History  Problem Relation Age of Onset  . Hypertension Father   . Diabetes Father   . Stroke Father   . Migraines Father   . Barrett's esophagus Father   . Anemia Father        iron def  . Heart disease Father        pacemaker, a fib, CHF  .  Dementia Mother   . Diabetes Sister   . Hyperlipidemia Sister   . Neuropathy Daughter   . Anxiety disorder Daughter    Allergies  Allergen Reactions  . Hydrocodone-Acetaminophen Itching  . Nsaids Nausea And Vomiting       . Povidone-Iodine Rash       . Propoxyphene N-Acetaminophen Itching        Current Outpatient Prescriptions on File Prior to Visit  Medication Sig Dispense Refill  . albuterol (PROAIR HFA) 108 (90 Base) MCG/ACT inhaler Inhale 2 puffs into the lungs every 4 (four) hours as needed. 3 Inhaler 2  . atorvastatin (LIPITOR) 20 MG tablet TAKE 1 TABLET DAILY 90 tablet 1  . benzonatate (TESSALON) 200 MG capsule Take 1 capsule (200 mg total) by mouth 3 (three) times daily as needed for cough. 30 capsule 0  . BIOTIN 5000 PO Take 1 tablet by mouth daily.    . Cholecalciferol (VITAMIN D3 PO) Take 1 tablet by mouth daily.    Marland Kitchen esomeprazole (NEXIUM) 40 MG capsule TAKE 1 CAPSULE EVERY MORNING 90 capsule 0  . estradiol (ESTRACE) 0.1 MG/GM vaginal cream Use 1 cm of cream in vagina and on outer vulva three times weekly 42.5 g 11  . FLUoxetine (PROZAC) 20 MG tablet Take 3 tablets (60 mg total) by mouth daily. 90 tablet 11  . FLUoxetine (PROZAC) 40 MG capsule TAKE 1 CAPSULE DAILY 90 capsule 0  . fluticasone (FLONASE) 50 MCG/ACT nasal spray Place 2 sprays into both nostrils as needed. 16 g 5  . montelukast (SINGULAIR) 10 MG tablet TAKE 1 TABLET AT BEDTIME 90 tablet 0  . Omega-3 Fatty Acids (OMEGA 3 PO) Take 1 capsule by mouth daily.    . Prenatal Vit-Fe Fumarate-FA (PRENATAL PO) Take 1 tablet by mouth daily.    . vitamin E (VITAMIN E) 400 UNIT capsule Take 2 capsules (800 Units total) by mouth daily. 60 capsule 3  . [DISCONTINUED] norethindrone-ethinyl estradiol-iron (MICROGESTIN FE1.5/30) 1.5-30 MG-MCG tablet Take 1 tablet by mouth daily.       No current facility-administered medications on file prior to visit.     Review of Systems Review of Systems  Constitutional: Negative for  fever, appetite change, fatigue and unexpected weight change.  Eyes: Negative for pain and visual disturbance.  Respiratory: Negative for cough and shortness of breath.   Cardiovascular: Negative for cp or palpitations    Gastrointestinal: Negative for nausea, diarrhea and constipation.  Genitourinary: pos for urgency and frequency. pos for mixed type urine  incontinence Skin: Negative for pallor or rash   Neurological: Negative for weakness, light-headedness, numbness and headaches.  Hematological: Negative for adenopathy. Does not bruise/bleed easily.  Psychiatric/Behavioral: pos for mood swings/anx/irritability and neg for SI        Objective:   Physical Exam  Constitutional: She appears well-developed and well-nourished. No distress.  obese and well appearing   HENT:  Head: Normocephalic and atraumatic.  Mouth/Throat: Oropharynx is clear and moist.  Eyes: Conjunctivae and EOM are normal. Pupils are equal, round, and reactive to light.  Neck: Normal range of motion. Neck supple. No JVD present. Carotid bruit is not present. No thyromegaly present.  Cardiovascular: Normal rate, regular rhythm, normal heart sounds and intact distal pulses.  Exam reveals no gallop.   Pulmonary/Chest: Effort normal and breath sounds normal. No respiratory distress. She has no wheezes. She has no rales.  No crackles  Abdominal: Soft. Bowel sounds are normal. She exhibits no distension, no abdominal bruit and no mass. There is no tenderness.  No suprapubic tenderness or fullness   No cva tenderness   Musculoskeletal: She exhibits no edema.  Lymphadenopathy:    She has no cervical adenopathy.  Neurological: She is alert. She has normal reflexes. No cranial nerve deficit. She exhibits normal muscle tone. Coordination normal.  Mild anxious hand tremor   Skin: Skin is warm and dry. No rash noted.  Psychiatric: Her speech is normal and behavior is normal. Thought content normal. Her mood appears anxious. Her  affect is not blunt and not inappropriate. Thought content is not paranoid. Cognition and memory are normal. She exhibits a depressed mood. She expresses no homicidal and no suicidal ideation.  Attentive Candid about stressors and symptoms  occ tearful          Assessment & Plan:   Problem List Items Addressed This Visit      Respiratory   Allergic rhinitis    Ref to allergist at pt req On flonase and singulair      Relevant Orders   Ambulatory referral to Allergy   Asthma    Worse symptoms with change in weather/season and with humidity Not severe Cough and some wheeze  Reassuring exam today  She wants to return to allergy -will refer       Relevant Orders   Ambulatory referral to Allergy     Other   Anxiety and depression - Primary    More problems with mood swings and irritability  Pt wonders if poss bipolar  Reviewed stressors/ coping techniques/symptoms/ support sources/ tx options and side effects in detail today On prozac 60  Add buspar 15 bid (titrate from 7.5 bid as tol) Discussed expectations of this medication including time to effectiveness and mechanism of action, also poss of side effects (early and late)- including mental fuzziness, weight or appetite change, nausea and poss of worse dep or anxiety (even suicidal thoughts)  Pt voiced understanding and will stop med and update if this occurs   Disc self care Ref to psychiatry Ref to counseling also  No SI- will alert [place] if this changes       Relevant Orders   Ambulatory referral to Psychiatry   Ambulatory referral to Psychology   TSH   History of anemia    Lab today       Hyperglycemia    A1C today  disc imp of low glycemic diet and wt loss to prevent DM2       Relevant Orders   Hemoglobin A1c  Comprehensive metabolic panel   Hyperlipidemia    Due for lipid panel on atorvastatin Lab today      Mixed incontinence    Pt never started detrol because pharmacy claimed "not to get it" - it  was sent again  Also estrogen cream-try  Update with progress      Relevant Medications   tolterodine (DETROL LA) 4 MG 24 hr capsule

## 2017-01-10 NOTE — Assessment & Plan Note (Signed)
Due for lipid panel on atorvastatin Lab today

## 2017-01-10 NOTE — Assessment & Plan Note (Signed)
A1C today  disc imp of low glycemic diet and wt loss to prevent DM2  

## 2017-01-10 NOTE — Assessment & Plan Note (Signed)
Worse symptoms with change in weather/season and with humidity Not severe Cough and some wheeze  Reassuring exam today  She wants to return to allergy -will refer

## 2017-01-10 NOTE — Patient Instructions (Addendum)
I will send the detrol again  Start that and add the estrogen cream when ready   We will refer you to psychiatry and counseling  Start buspar 1/2 pill twice daily for 1-2 weeks and then increase it to 1 pill twice daily  This will help anxiety  If side effects or if you feel worse please stop it and call   Also referring to allergist

## 2017-01-10 NOTE — Assessment & Plan Note (Signed)
More problems with mood swings and irritability  Pt wonders if poss bipolar  Reviewed stressors/ coping techniques/symptoms/ support sources/ tx options and side effects in detail today On prozac 60  Add buspar 15 bid (titrate from 7.5 bid as tol) Discussed expectations of this medication including time to effectiveness and mechanism of action, also poss of side effects (early and late)- including mental fuzziness, weight or appetite change, nausea and poss of worse dep or anxiety (even suicidal thoughts)  Pt voiced understanding and will stop med and update if this occurs   Disc self care Ref to psychiatry Ref to counseling also  No SI- will alert [place] if this changes

## 2017-01-10 NOTE — Assessment & Plan Note (Signed)
Lab today.

## 2017-01-10 NOTE — Assessment & Plan Note (Signed)
Ref to allergist at pt req On flonase and singulair

## 2017-01-11 ENCOUNTER — Encounter: Payer: Self-pay | Admitting: Family Medicine

## 2017-01-31 ENCOUNTER — Ambulatory Visit (INDEPENDENT_AMBULATORY_CARE_PROVIDER_SITE_OTHER): Payer: BLUE CROSS/BLUE SHIELD | Admitting: Clinical

## 2017-01-31 DIAGNOSIS — F331 Major depressive disorder, recurrent, moderate: Secondary | ICD-10-CM | POA: Diagnosis not present

## 2017-02-15 ENCOUNTER — Encounter: Payer: Self-pay | Admitting: Allergy

## 2017-02-15 ENCOUNTER — Ambulatory Visit (INDEPENDENT_AMBULATORY_CARE_PROVIDER_SITE_OTHER): Payer: BLUE CROSS/BLUE SHIELD | Admitting: Clinical

## 2017-02-15 ENCOUNTER — Ambulatory Visit (INDEPENDENT_AMBULATORY_CARE_PROVIDER_SITE_OTHER): Payer: BLUE CROSS/BLUE SHIELD | Admitting: Allergy

## 2017-02-15 VITALS — BP 130/80 | HR 75 | Temp 98.1°F | Resp 17 | Ht 67.72 in | Wt 211.4 lb

## 2017-02-15 DIAGNOSIS — F331 Major depressive disorder, recurrent, moderate: Secondary | ICD-10-CM

## 2017-02-15 DIAGNOSIS — J309 Allergic rhinitis, unspecified: Secondary | ICD-10-CM

## 2017-02-15 DIAGNOSIS — J4540 Moderate persistent asthma, uncomplicated: Secondary | ICD-10-CM

## 2017-02-15 DIAGNOSIS — H1010 Acute atopic conjunctivitis, unspecified eye: Secondary | ICD-10-CM

## 2017-02-15 MED ORDER — LEVOCETIRIZINE DIHYDROCHLORIDE 5 MG PO TABS: 5.0000 mg | ORAL_TABLET | Freq: Every evening | ORAL | 5 refills | Status: DC

## 2017-02-15 MED ORDER — ALBUTEROL SULFATE HFA 108 (90 BASE) MCG/ACT IN AERS: 2.0000 | INHALATION_SPRAY | RESPIRATORY_TRACT | 3 refills | Status: DC | PRN

## 2017-02-15 MED ORDER — BUDESONIDE-FORMOTEROL FUMARATE 160-4.5 MCG/ACT IN AERO: 2.0000 | INHALATION_SPRAY | Freq: Two times a day (BID) | RESPIRATORY_TRACT | 5 refills | Status: DC

## 2017-02-15 MED ORDER — OLOPATADINE HCL 0.2 % OP SOLN: 1.0000 [drp] | OPHTHALMIC | 5 refills | Status: DC

## 2017-02-15 NOTE — Progress Notes (Signed)
New Patient Note  RE: Michele [name] MRN: [id number] DOB: Nov 21, 1965 Date of Office Visit: 02/15/2017  Referring provider: Abner Greenspan, MD Primary care provider: Abner Greenspan, MD  Chief Complaint: asthma  History of present illness: Michele [name] is a 51 y.o. female presenting today for consultation for asthma.    She feels that hormonal changes with menopause as well as the weather changes has caused her to have increase in asthma symptoms.  She states she has a harsh sounding dry cough as well as difficulty breathing.  The cough can occur all throughout the day.  She also reports wheezing and chest tightness.  She reports she does have nighttime awakenings about 1-2 nights/weeks.  She reports she can't be outside for a long period of time with getting short of breath.  She ran out of her albuterol about 6-7 months ago.  She has not been seen in ED or UC for these symptoms and has not had any recent steroid use.    She reports she has been getting bronchitis more frequently over the past couple years which are typically treated with antibiotics and steroids.  She feels she requires steroids 1-2 times a year.    She was previously diagnosed with asthma around '95 when she initially was evaluated by our practice.  She does recall taking a daily inhaler in the past.  She does take Singulair that she has been on since the '90s.  She reports pregnancy was a big trigger of her asthma when she was younger.   She does have history of seasonal allergies.  She reports changes of the seasons is a worse time for her.  She reports nasal congestion with PND, sinus frontal HA, itchy/watery eyes, and sneezing.  Has used flonase, zyrtec as well as eye drops in the past but does not have any of these medications currently to manage her symptoms.    No history of food allergy or eczema.  Review of systems: Review of Systems  Constitutional: Negative for chills, fever and malaise/fatigue.  HENT:  Positive for congestion. Negative for ear discharge, ear pain, nosebleeds, sinus pain, sore throat and tinnitus.   Eyes: Negative for pain, discharge and redness.  Respiratory: Positive for cough, shortness of breath and wheezing. Negative for sputum production.   Cardiovascular: Negative for chest pain.  Gastrointestinal: Negative for abdominal pain, constipation, diarrhea, heartburn, nausea and vomiting.  Musculoskeletal: Negative for joint pain.  Skin: Negative for itching and rash.  Neurological: Negative for headaches.    All other systems negative unless noted above in HPI  Past medical history: Past Medical History:  Diagnosis Date  . Abnormal Pap smear of cervix    cryotx  . Anxiety   . Arthritis   . Asthma   . Depression   . Endometriosis   . Gastric polyp   . GERD (gastroesophageal reflux disease)   . Hemorrhoids   . Hiatal hernia   . HLD (hyperlipidemia)   . Hyperplastic colon polyp   . IBS (irritable bowel syndrome)   . Iron deficiency anemia 1/12  . Pneumonia   . Rotator cuff tear, left 2009  . Syncope   . Tachycardia    s/p negative cardiac work up with echo  . Uterine fibroid [id number]    Past surgical history: Past Surgical History:  Procedure Laterality Date  . CARPAL TUNNEL RELEASE Bilateral   . COLONOSCOPY  1999   small polyps, hemms  . ESOPHAGOGASTRODUODENOSCOPY  1999  . ESOPHAGOGASTRODUODENOSCOPY  2/12   gastric polyp and HH  . HYSTEROSCOPY    . LAPAROSCOPY  2006   endometriosis  . ROTATOR CUFF REPAIR Left     Family history:  Family History  Problem Relation Age of Onset  . Hypertension Father   . Diabetes Father   . Stroke Father   . Migraines Father   . Barrett's esophagus Father   . Anemia Father        iron def  . Heart disease Father        pacemaker, a fib, CHF  . Dementia Mother   . Allergic rhinitis Mother   . Diabetes Sister   . Hyperlipidemia Sister   . Neuropathy Daughter   . Anxiety disorder Daughter   . Angioedema  Neg Hx   . Asthma Neg Hx   . Atopy Neg Hx   . Eczema Neg Hx   . Immunodeficiency Neg Hx   . Urticaria Neg Hx     Social history:  Social History Main Topics  . Smoking status: Former Smoker    Packs/day: 0.25    Years: 5.00    Types: Cigarettes    Quit date: 07/03/1992  . Smokeless tobacco: Never Used  . Alcohol use 0.0 oz/week     Comment: Rarely, once every few months    Social History Narrative   She lives with husband In a home with carpeting with electric heating and central cooling. There are 2 dogs and 5 cats in the home. There is no concern for water damage, mildew or oaches in the home.    She works as a Dance movement psychotherapist.    Medication List: Allergies as of 02/15/2017      Reactions   Hydrocodone-acetaminophen Itching   Nsaids Nausea And Vomiting       Povidone-iodine Rash      Propoxyphene N-acetaminophen Itching          Medication List       Accurate as of 02/15/17  1:33 PM. Always use your most recent med list.          atorvastatin 20 MG tablet Commonly known as:  LIPITOR TAKE 1 TABLET DAILY   benzonatate 200 MG capsule Commonly known as:  TESSALON Take 1 capsule (200 mg total) by mouth 3 (three) times daily as needed for cough.   BIOTIN 5000 PO Take 1 tablet by mouth daily.   busPIRone 15 MG tablet Commonly known as:  BUSPAR Take 1 tablet (15 mg total) by mouth 2 (two) times daily. Take 1/2 pill twice daily for 1-2 weeks and then increase to 1 pill twice daily   citalopram 20 MG tablet Commonly known as:  CELEXA Take 20 mg by mouth daily with breakfast.   esomeprazole 40 MG capsule Commonly known as:  NEXIUM TAKE 1 CAPSULE BY MOUTH EVERY DAY IN THE MORNING   estradiol 0.1 MG/GM vaginal cream Commonly known as:  ESTRACE Use 1 cm of cream in vagina and on outer vulva three times weekly   FLUoxetine 20 MG tablet Commonly known as:  PROZAC Take 3 tablets (60 mg total) by mouth daily.   fluticasone 50 MCG/ACT nasal spray Commonly known  as:  FLONASE Place 2 sprays into both nostrils as needed.   montelukast 10 MG tablet Commonly known as:  SINGULAIR TAKE 1 TABLET AT BEDTIME   OMEGA 3 PO Take 1 capsule by mouth daily.   PRENATAL PO Take 1 tablet by mouth daily.   tolterodine 4 MG 24  hr capsule Commonly known as:  DETROL LA Take 1 capsule (4 mg total) by mouth daily.   VITAMIN D3 PO Take 1 tablet by mouth daily.   vitamin E 400 UNIT capsule Commonly known as:  vitamin E Take 2 capsules (800 Units total) by mouth daily.       Known medication allergies: Allergies  Allergen Reactions  . Hydrocodone-Acetaminophen Itching  . Nsaids Nausea And Vomiting       . Povidone-Iodine Rash       . Propoxyphene N-Acetaminophen Itching          Physical examination: Blood pressure 130/80, pulse 75, temperature 98.1 F (36.7 C), temperature source Oral, resp. rate 17, height 5' 7.72" (1.72 m), weight 211 lb 6.4 oz (95.9 kg), last menstrual period 03/06/2014, SpO2 93 %.  General: Alert, interactive, in no acute distress. HEENT: PERRLA, TMs pearly gray, turbinates moderately edematous with clear discharge, post-pharynx non erythematous. Neck: Supple without lymphadenopathy. Lungs: Clear to auscultation without wheezing, rhonchi or rales. {no increased work of breathing. CV: Normal S1, S2 without murmurs. Abdomen: Nondistended, nontender. Skin: Warm and dry, without lesions or rashes. Extremities:  No clubbing, cyanosis or edema. Neuro:   Grossly intact.  Diagnositics/Labs:  Spirometry: FEV1: 2.73L  94%, FVC: 3.39L  95%, ratio consistent with Nonobstructive pattern  Allergy testing: environmental allergy skin prick testing was positive to short ragweed, cottonwood, cockroach.   Intradermal testing was positive for mold mix 2, mold mix 4 and mite mix Allergy testing results were read and interpreted by provider, documented by clinical staff.   Assessment and plan:   Asthma, mod persistent   - not well  controlled at this time however lung function on spirometry today is normal   - start Symbicort 147mcg  2 puffs twice a day   - continue Singulair 10mg  daily   - have access to albuterol inhaler 2 puffs every 4-6 hours as needed for cough/wheeze/shortness of breath/chest tightness.  May use 15-20 minutes prior to activity.   Monitor frequency of use.    Asthma control goals:   Full participation in all desired activities (may need albuterol before activity)  Albuterol use two time or less a week on average (not counting use with activity)  Cough interfering with sleep two time or less a month  Oral steroids no more than once a year  No hospitalizations  Allergic rhinoconjunctivitis    - allergy testing today is positive for cockroach, ragweed, cottonwood tree, molds, cockroach and dust mites.  Allergen avoidance measures discussed and handouts provided.     - recommend use of Pataday 1 drop each eye as needed for itchy/watery/red eyes    - Trial Xyzal 5mg  daily    - Nasacort 2 sprays each nostril daily for nasal congestion  Follow-up 4 months  I appreciate the opportunity to take part in Nechama's care. Please do not hesitate to contact me with questions.  Sincerely,   Prudy Feeler, MD Allergy/Immunology [facility]

## 2017-02-15 NOTE — Patient Instructions (Addendum)
Asthma   - not well controlled at this time however lung function on spirometry today is normal   - start Symbicort 122mcg  2 puffs twice a day   - continue Singulair 10mg  daily   - have access to albuterol inhaler 2 puffs every 4-6 hours as needed for cough/wheeze/shortness of breath/chest tightness.  May use 15-20 minutes prior to activity.   Monitor frequency of use.    Asthma control goals:   Full participation in all desired activities (may need albuterol before activity)  Albuterol use two time or less a week on average (not counting use with activity)  Cough interfering with sleep two time or less a month  Oral steroids no more than once a year  No hospitalizations   Allergic rhinoconjunctivitis    - allergy testing today is positive for cockroach, ragweed, cottonwood tree, molds, cockroach and dust mites.  Allergen avoidance measures discussed and handouts provided.     - recommend use of Pataday 1 drop each eye as needed for itchy/watery/red eyes    - Trial Xyzal 5mg  daily    - Nasacort 2 sprays each nostril daily for nasal congestion  Follow-up 4 months

## 2017-02-20 ENCOUNTER — Ambulatory Visit (INDEPENDENT_AMBULATORY_CARE_PROVIDER_SITE_OTHER): Payer: BLUE CROSS/BLUE SHIELD | Admitting: Family Medicine

## 2017-02-20 ENCOUNTER — Encounter: Payer: Self-pay | Admitting: Family Medicine

## 2017-02-20 VITALS — BP 122/76 | HR 98 | Temp 98.5°F | Ht 67.75 in | Wt 202.5 lb

## 2017-02-20 DIAGNOSIS — R197 Diarrhea, unspecified: Secondary | ICD-10-CM

## 2017-02-20 LAB — CBC WITH DIFFERENTIAL/PLATELET
BASOS ABS: 0 10*3/uL (ref 0.0–0.1)
BASOS PCT: 0.3 % (ref 0.0–3.0)
EOS ABS: 0.2 10*3/uL (ref 0.0–0.7)
Eosinophils Relative: 1.9 % (ref 0.0–5.0)
HEMATOCRIT: 44.8 % (ref 36.0–46.0)
HEMOGLOBIN: 15 g/dL (ref 12.0–15.0)
LYMPHS PCT: 7.2 % — AB (ref 12.0–46.0)
Lymphs Abs: 0.8 10*3/uL (ref 0.7–4.0)
MCHC: 33.5 g/dL (ref 30.0–36.0)
MCV: 90.5 fl (ref 78.0–100.0)
Monocytes Absolute: 0.6 10*3/uL (ref 0.1–1.0)
Monocytes Relative: 5.6 % (ref 3.0–12.0)
Neutro Abs: 9 10*3/uL — ABNORMAL HIGH (ref 1.4–7.7)
Neutrophils Relative %: 85 % — ABNORMAL HIGH (ref 43.0–77.0)
Platelets: 320 10*3/uL (ref 150.0–400.0)
RBC: 4.95 Mil/uL (ref 3.87–5.11)
RDW: 13.5 % (ref 11.5–15.5)
WBC: 10.6 10*3/uL — AB (ref 4.0–10.5)

## 2017-02-20 NOTE — Patient Instructions (Signed)
I think you likely have a viral diarrhea but we want to check for c diff and food poisoning  Sip fluids -at least 2 sips per 10 minutes  Stir bubbles out of your ginger ale  Do not eat until you fell ready - bland  /cracker   Lab today  Stool tests today  Go to the ER if you think you are dehydrated -- dry mouth/ rapid heart beat/dizziness   Ok to try immodium - if it causes abdominal pain let us know

## 2017-02-20 NOTE — Progress Notes (Signed)
Subjective:    Patient ID: Michele [name], female    DOB: [birth year], 51 y.o.   MRN: [id number]  HPI Here for GI symptoms incl bloating and diarrhea with low grade temp   She has hx of gerd and ibs in the past  Last colonoscopy 2/12 (hyperplastic polyps)  Started 7 pm sat  All liquid stool -light to clear fluid  Last ate a [place] with her parents (no one else got sick)  Some nausea -never vomited  Bloating  A lot of abdominal noise  Some sharp pains above umbilicus at times  No blood in her stool   Having bm at least 1 per hour if not more   No fainting    Low grade temp  achey Temp: 98.5 F (36.9 C)  Weak also  Never above 99.7  No known sick exposures    Tried immodium and it helped some  Nothing to eat until Monday  Took ginger ale  Crackers and cheese/ and a little pb  Symptoms worsened after that last night     Wt Readings from Last 3 Encounters:  02/20/17 202 lb 8 oz (91.9 kg)  02/15/17 211 lb 6.4 oz (95.9 kg)  01/10/17 211 lb 8 oz (95.9 kg)   BP Readings from Last 3 Encounters:  02/20/17 122/76  02/15/17 130/80  01/10/17 128/70   Patient Active Problem List   Diagnosis Date Noted  . Allergic rhinitis 01/10/2017  . Nausea without vomiting 04/12/2016  . RUQ abdominal pain 04/04/2016  . Multiple joint pain 01/19/2016  . Myalgia 01/19/2016  . Adjustment reaction with anxious mood 08/18/2015  . Arm paresthesia, right 07/14/2015  . Diarrhea 10/13/2014  . Rectal pressure 10/01/2013  . Rectocele 10/01/2013  . Painful intercourse 10/01/2013  . Skin tag 04/23/2013  . Mixed incontinence 04/23/2013  . Routine gynecological examination 04/01/2012  . Hyperglycemia 04/01/2012  . Routine general medical examination at a health care facility 03/11/2012  . GERD 08/26/2010  . History of anemia 07/19/2010  . FREQUENCY, URINARY 07/06/2010  . MICROSCOPIC HEMATURIA 06/09/2010  . History of colonic polyps 11/15/2007  . IBS 10/08/2007  . ENDOMETRIOSIS  10/08/2007  . Hyperlipidemia 10/30/2006  . Anxiety and depression 10/30/2006  . HEMORRHOIDS, INTERNAL 10/30/2006  . Asthma 10/30/2006  . MIGRAINES, HX OF 10/30/2006   Past Medical History:  Diagnosis Date  . Abnormal Pap smear of cervix    cryotx  . Anxiety   . Arthritis   . Asthma   . Depression   . Endometriosis   . Gastric polyp   . GERD (gastroesophageal reflux disease)   . Hemorrhoids   . Hiatal hernia   . HLD (hyperlipidemia)   . Hyperplastic colon polyp   . IBS (irritable bowel syndrome)   . Iron deficiency anemia 1/12  . Pneumonia   . Rotator cuff tear, left 2009  . Syncope   . Tachycardia    s/p negative cardiac work up with echo  . Uterine fibroid [id number]   Past Surgical History:  Procedure Laterality Date  . CARPAL TUNNEL RELEASE Bilateral   . COLONOSCOPY  1999   small polyps, hemms  . ESOPHAGOGASTRODUODENOSCOPY  1999  . ESOPHAGOGASTRODUODENOSCOPY  2/12   gastric polyp and HH  . HYSTEROSCOPY    . LAPAROSCOPY  2006   endometriosis  . ROTATOR CUFF REPAIR Left    Social History  Substance Use Topics  . Smoking status: Former Smoker    Packs/day: 0.25    Years: 5.00  Types: Cigarettes    Quit date: 07/03/1992  . Smokeless tobacco: Never Used  . Alcohol use 0.0 oz/week     Comment: Rarely, once every few months   Family History  Problem Relation Age of Onset  . Hypertension Father   . Diabetes Father   . Stroke Father   . Migraines Father   . Barrett's esophagus Father   . Anemia Father        iron def  . Heart disease Father        pacemaker, a fib, CHF  . Dementia Mother   . Allergic rhinitis Mother   . Diabetes Sister   . Hyperlipidemia Sister   . Neuropathy Daughter   . Anxiety disorder Daughter   . Angioedema Neg Hx   . Asthma Neg Hx   . Atopy Neg Hx   . Eczema Neg Hx   . Immunodeficiency Neg Hx   . Urticaria Neg Hx    Allergies  Allergen Reactions  . Hydrocodone-Acetaminophen Itching  . Nsaids Nausea And Vomiting       .  Povidone-Iodine Rash       . Propoxyphene N-Acetaminophen Itching        Current Outpatient Prescriptions on File Prior to Visit  Medication Sig Dispense Refill  . albuterol (PROAIR HFA) 108 (90 Base) MCG/ACT inhaler Inhale 2 puffs into the lungs every 4 (four) hours as needed for wheezing or shortness of breath. 1 Inhaler 3  . atorvastatin (LIPITOR) 20 MG tablet TAKE 1 TABLET DAILY 90 tablet 1  . benzonatate (TESSALON) 200 MG capsule Take 1 capsule (200 mg total) by mouth 3 (three) times daily as needed for cough. 30 capsule 0  . BIOTIN 5000 PO Take 1 tablet by mouth daily.    . budesonide-formoterol (SYMBICORT) 160-4.5 MCG/ACT inhaler Inhale 2 puffs into the lungs 2 (two) times daily. 1 Inhaler 5  . busPIRone (BUSPAR) 15 MG tablet Take 1 tablet (15 mg total) by mouth 2 (two) times daily. Take 1/2 pill twice daily for 1-2 weeks and then increase to 1 pill twice daily 60 tablet 5  . Cholecalciferol (VITAMIN D3 PO) Take 1 tablet by mouth daily.    . citalopram (CELEXA) 20 MG tablet Take 20 mg by mouth daily with breakfast.  0  . esomeprazole (NEXIUM) 40 MG capsule TAKE 1 CAPSULE BY MOUTH EVERY DAY IN THE MORNING  0  . estradiol (ESTRACE) 0.1 MG/GM vaginal cream Use 1 cm of cream in vagina and on outer vulva three times weekly 42.5 g 11  . fluticasone (FLONASE) 50 MCG/ACT nasal spray Place 2 sprays into both nostrils as needed. 16 g 5  . levocetirizine (XYZAL) 5 MG tablet Take 1 tablet (5 mg total) by mouth every evening. 30 tablet 5  . montelukast (SINGULAIR) 10 MG tablet TAKE 1 TABLET AT BEDTIME 90 tablet 0  . Olopatadine HCl (PATADAY) 0.2 % SOLN Place 1 drop into both eyes 1 day or 1 dose. 1 Bottle 5  . Omega-3 Fatty Acids (OMEGA 3 PO) Take 1 capsule by mouth daily.    . Prenatal Vit-Fe Fumarate-FA (PRENATAL PO) Take 1 tablet by mouth daily.    Marland Kitchen tolterodine (DETROL LA) 4 MG 24 hr capsule Take 1 capsule (4 mg total) by mouth daily. 30 capsule 11  . vitamin E (VITAMIN E) 400 UNIT capsule  Take 2 capsules (800 Units total) by mouth daily. 60 capsule 3  . [DISCONTINUED] norethindrone-ethinyl estradiol-iron (MICROGESTIN FE1.5/30) 1.5-30 MG-MCG tablet Take 1 tablet  by mouth daily.       No current facility-administered medications on file prior to visit.     Review of Systems Review of Systems  Constitutional: pos for malaise/ elevated temp and decreased appetite.  Eyes: Negative for pain and visual disturbance.  Respiratory: Negative for cough and shortness of breath.   Cardiovascular: Negative for cp or palpitations    Gastrointestinal: Negative for constipation/ blood in stool/ black stool  Genitourinary: Negative for urgency and frequency.  Skin: Negative for pallor or rash   Neurological: Negative for weakness, light-headedness, numbness and headaches.  Hematological: Negative for adenopathy. Does not bruise/bleed easily.  Psychiatric/Behavioral: Negative for dysphoric mood. The patient is not nervous/anxious.         Objective:   Physical Exam  Constitutional: She appears well-developed and well-nourished. No distress.  Obese and fatigued appearing   HENT:  Head: Normocephalic and atraumatic.  Mouth/Throat: Oropharynx is clear and moist.  MM are tacky but not dry  Eyes: Pupils are equal, round, and reactive to light. Conjunctivae and EOM are normal. No scleral icterus.  Neck: Normal range of motion. Neck supple.  Cardiovascular: Regular rhythm and normal heart sounds.   Mild tachycardia  Pulmonary/Chest: Effort normal and breath sounds normal. No respiratory distress. She has no wheezes. She has no rales.  Abdominal: Soft. She exhibits no distension, no ascites, no pulsatile midline mass and no mass. Bowel sounds are increased. There is no hepatosplenomegaly. There is generalized tenderness. There is no rebound, no guarding, no CVA tenderness, no tenderness at McBurney's point and negative Murphy's sign.  Mild general abdominal tenderness w/o rebound  Inc bs (not  high pitched)  Lymphadenopathy:    She has no cervical adenopathy.  Neurological: She is alert. She exhibits normal muscle tone.  Skin: Skin is warm and dry. No erythema. No pallor.  Nl skin turgor and color Brisk capillary refill   Psychiatric: She has a normal mood and affect.          Assessment & Plan:   Problem List Items Addressed This Visit      Other   Diarrhea - Primary    Presumed infectious / likely viral with poss early dehydration  Disc imp of fluid hydration and plan made for this  immodium prn unless bloody stool or inc abd pain  Lab today cbc and cmet  Stool cx and c diff  Rest  Adv diet as tolerated BRAT (no dairy)  Update if worse and go to ED if s/s of dehydration /reviewed       Relevant Orders   CBC with Differential/Platelet (Completed)   C. difficile GDH and Toxin A/B (Completed)   Stool culture

## 2017-02-21 ENCOUNTER — Telehealth: Payer: Self-pay | Admitting: Family Medicine

## 2017-02-21 LAB — C. DIFFICILE GDH AND TOXIN A/B
C. difficile GDH: NOT DETECTED
C. difficile Toxin A/B: NOT DETECTED

## 2017-02-21 NOTE — Assessment & Plan Note (Signed)
Presumed infectious / likely viral with poss early dehydration  Disc imp of fluid hydration and plan made for this  immodium prn unless bloody stool or inc abd pain  Lab today cbc and cmet  Stool cx and c diff  Rest  Adv diet as tolerated BRAT (no dairy)  Update if worse and go to ED if s/s of dehydration Jolinda Croak

## 2017-02-21 NOTE — Telephone Encounter (Signed)
Patient returned call about her lab work. °

## 2017-02-21 NOTE — Telephone Encounter (Signed)
Left voicemail requesting pt to call the office back 

## 2017-02-21 NOTE — Telephone Encounter (Signed)
Addressed through result notes  

## 2017-02-24 LAB — STOOL CULTURE

## 2017-02-27 ENCOUNTER — Ambulatory Visit (INDEPENDENT_AMBULATORY_CARE_PROVIDER_SITE_OTHER): Payer: BLUE CROSS/BLUE SHIELD | Admitting: Clinical

## 2017-02-27 DIAGNOSIS — F331 Major depressive disorder, recurrent, moderate: Secondary | ICD-10-CM

## 2017-03-07 ENCOUNTER — Ambulatory Visit (INDEPENDENT_AMBULATORY_CARE_PROVIDER_SITE_OTHER): Payer: BLUE CROSS/BLUE SHIELD | Admitting: Clinical

## 2017-03-07 DIAGNOSIS — F331 Major depressive disorder, recurrent, moderate: Secondary | ICD-10-CM | POA: Diagnosis not present

## 2017-03-14 ENCOUNTER — Ambulatory Visit: Payer: BLUE CROSS/BLUE SHIELD | Admitting: Clinical

## 2017-03-16 ENCOUNTER — Other Ambulatory Visit: Payer: Self-pay | Admitting: Family Medicine

## 2017-03-20 ENCOUNTER — Ambulatory Visit: Payer: BLUE CROSS/BLUE SHIELD | Admitting: Clinical

## 2017-04-02 ENCOUNTER — Ambulatory Visit: Payer: BLUE CROSS/BLUE SHIELD | Admitting: Clinical

## 2017-05-07 ENCOUNTER — Other Ambulatory Visit: Payer: Self-pay | Admitting: Family Medicine

## 2017-05-07 DIAGNOSIS — J4520 Mild intermittent asthma, uncomplicated: Secondary | ICD-10-CM

## 2017-06-06 ENCOUNTER — Ambulatory Visit: Payer: BLUE CROSS/BLUE SHIELD | Admitting: Allergy

## 2018-02-25 ENCOUNTER — Emergency Department (HOSPITAL_COMMUNITY): Payer: No Typology Code available for payment source

## 2018-02-25 ENCOUNTER — Other Ambulatory Visit: Payer: Self-pay

## 2018-02-25 ENCOUNTER — Emergency Department (HOSPITAL_COMMUNITY)
Admission: EM | Admit: 2018-02-25 | Discharge: 2018-02-26 | Disposition: A | Discharge status: Home / Self Care | Payer: No Typology Code available for payment source | Attending: Emergency Medicine | Admitting: Emergency Medicine

## 2018-02-25 ENCOUNTER — Encounter (HOSPITAL_COMMUNITY): Payer: Self-pay

## 2018-02-25 DIAGNOSIS — Z79899 Other long term (current) drug therapy: Secondary | ICD-10-CM | POA: Diagnosis not present

## 2018-02-25 DIAGNOSIS — Y998 Other external cause status: Secondary | ICD-10-CM | POA: Insufficient documentation

## 2018-02-25 DIAGNOSIS — S60221A Contusion of right hand, initial encounter: Secondary | ICD-10-CM | POA: Diagnosis not present

## 2018-02-25 DIAGNOSIS — F332 Major depressive disorder, recurrent severe without psychotic features: Secondary | ICD-10-CM | POA: Insufficient documentation

## 2018-02-25 DIAGNOSIS — Y939 Activity, unspecified: Secondary | ICD-10-CM | POA: Insufficient documentation

## 2018-02-25 DIAGNOSIS — F430 Acute stress reaction: Secondary | ICD-10-CM | POA: Diagnosis not present

## 2018-02-25 DIAGNOSIS — S8011XA Contusion of right lower leg, initial encounter: Secondary | ICD-10-CM | POA: Diagnosis not present

## 2018-02-25 DIAGNOSIS — S8991XA Unspecified injury of right lower leg, initial encounter: Secondary | ICD-10-CM | POA: Diagnosis present

## 2018-02-25 DIAGNOSIS — Z87891 Personal history of nicotine dependence: Secondary | ICD-10-CM | POA: Insufficient documentation

## 2018-02-25 DIAGNOSIS — J45909 Unspecified asthma, uncomplicated: Secondary | ICD-10-CM | POA: Diagnosis not present

## 2018-02-25 DIAGNOSIS — E785 Hyperlipidemia, unspecified: Secondary | ICD-10-CM | POA: Diagnosis not present

## 2018-02-25 DIAGNOSIS — Y92014 Private driveway to single-family (private) house as the place of occurrence of the external cause: Secondary | ICD-10-CM | POA: Diagnosis not present

## 2018-02-25 LAB — ACETAMINOPHEN LEVEL: Acetaminophen (Tylenol), Serum: <10 ug/mL — ABNORMAL LOW (ref 10–30)

## 2018-02-25 LAB — COMPREHENSIVE METABOLIC PANEL
ALT: 60 U/L — AB (ref 0–44)
AST: 42 U/L — ABNORMAL HIGH (ref 15–41)
Albumin: 4.5 g/dL (ref 3.5–5.0)
Alkaline Phosphatase: 113 U/L (ref 38–126)
Anion gap: 13 (ref 5–15)
BUN: 13 mg/dL (ref 6–20)
CHLORIDE: 107 mmol/L (ref 98–111)
CO2: 25 mmol/L (ref 22–32)
CREATININE: 1.12 mg/dL — AB (ref 0.44–1.00)
Calcium: 9.6 mg/dL (ref 8.9–10.3)
GFR, EST NON AFRICAN AMERICAN: 55 mL/min — AB (ref >60)
Glucose, Bld: 128 mg/dL — ABNORMAL HIGH (ref 70–99)
POTASSIUM: 3.9 mmol/L (ref 3.5–5.1)
Sodium: 145 mmol/L (ref 135–145)
Total Bilirubin: 0.8 mg/dL (ref 0.3–1.2)
Total Protein: 7.8 g/dL (ref 6.5–8.1)

## 2018-02-25 LAB — RAPID URINE DRUG SCREEN, HOSP PERFORMED
AMPHETAMINES: NOT DETECTED
Barbiturates: NOT DETECTED
Benzodiazepines: NOT DETECTED
Cocaine: NOT DETECTED
OPIATES: NOT DETECTED
Tetrahydrocannabinol: NOT DETECTED

## 2018-02-25 LAB — CBC
HCT: 44.8 % (ref 36.0–46.0)
Hemoglobin: 15.2 g/dL — ABNORMAL HIGH (ref 12.0–15.0)
MCH: 30.7 pg (ref 26.0–34.0)
MCHC: 33.9 g/dL (ref 30.0–36.0)
MCV: 90.5 fL (ref 78.0–100.0)
PLATELETS: 346 10*3/uL (ref 150–400)
RBC: 4.95 MIL/uL (ref 3.87–5.11)
RDW: 13 % (ref 11.5–15.5)
WBC: 12.1 10*3/uL — ABNORMAL HIGH (ref 4.0–10.5)

## 2018-02-25 LAB — SALICYLATE LEVEL

## 2018-02-25 LAB — ETHANOL

## 2018-02-25 MED ORDER — ACETAMINOPHEN 500 MG PO TABS
1000.0000 mg | ORAL_TABLET | Freq: Once | ORAL | Status: AC
  Administered 2018-02-25: 1000 mg via ORAL
  Filled 2018-02-25: qty 2

## 2018-02-25 NOTE — ED Notes (Signed)
Patient given water and is aware a urine sample is needed.
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
## 2018-02-25 NOTE — ED Notes (Signed)
Patient made aware that bra needed to be removed prior to chest x-ray. Patient has not taken it off at this time but is aware. Will make x-ray tech aware that patient was made aware.

## 2018-02-25 NOTE — BH Assessment (Signed)
Clinician attempted to make contact with pt for TTS Assessment but pt is in radiology for x-rays. Will attempt again at a later time.
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
## 2018-02-26 NOTE — ED Notes (Signed)
Pt and family given warm blankets. Will continue to monitor.
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
## 2018-02-26 NOTE — ED Notes (Signed)
Discharge instructions reviewed with pt. Pt verbalized understanding. Pt to follow up with resources. Pt ambulatory to waiting room using crutches.

## 2018-02-26 NOTE — ED Notes (Signed)
Patient given crutches and explained how to use them. Patient walked to BR with crutches and tolerated well.

## 2018-02-26 NOTE — ED Notes (Signed)
Bed: BB79 Expected date:  Expected time:  Means of arrival:  Comments: rm 5

## 2018-04-04 ENCOUNTER — Encounter: Payer: Self-pay | Admitting: Family Medicine

## 2018-04-04 ENCOUNTER — Ambulatory Visit (INDEPENDENT_AMBULATORY_CARE_PROVIDER_SITE_OTHER): Payer: BC Managed Care – PPO | Admitting: Family Medicine

## 2018-04-04 ENCOUNTER — Other Ambulatory Visit (HOSPITAL_COMMUNITY)
Admission: RE | Admit: 2018-04-04 | Discharge: 2018-04-04 | Disposition: A | Discharge status: Home / Self Care | Payer: BC Managed Care – PPO | Source: Ambulatory Visit | Attending: Family Medicine | Admitting: Family Medicine

## 2018-04-04 VITALS — BP 126/78 | HR 66 | Temp 97.6°F | Ht 67.75 in | Wt 204.5 lb

## 2018-04-04 DIAGNOSIS — Z86010 Personal history of colonic polyps: Secondary | ICD-10-CM

## 2018-04-04 DIAGNOSIS — N3946 Mixed incontinence: Secondary | ICD-10-CM

## 2018-04-04 DIAGNOSIS — R7303 Prediabetes: Secondary | ICD-10-CM | POA: Diagnosis not present

## 2018-04-04 DIAGNOSIS — J4520 Mild intermittent asthma, uncomplicated: Secondary | ICD-10-CM

## 2018-04-04 DIAGNOSIS — K219 Gastro-esophageal reflux disease without esophagitis: Secondary | ICD-10-CM

## 2018-04-04 DIAGNOSIS — Z113 Encounter for screening for infections with a predominantly sexual mode of transmission: Secondary | ICD-10-CM | POA: Insufficient documentation

## 2018-04-04 DIAGNOSIS — Z01419 Encounter for gynecological examination (general) (routine) without abnormal findings: Secondary | ICD-10-CM

## 2018-04-04 DIAGNOSIS — E7800 Pure hypercholesterolemia, unspecified: Secondary | ICD-10-CM

## 2018-04-04 DIAGNOSIS — Z23 Encounter for immunization: Secondary | ICD-10-CM | POA: Diagnosis not present

## 2018-04-04 DIAGNOSIS — R350 Frequency of micturition: Secondary | ICD-10-CM

## 2018-04-04 DIAGNOSIS — Z0000 Encounter for general adult medical examination without abnormal findings: Secondary | ICD-10-CM

## 2018-04-04 DIAGNOSIS — F4322 Adjustment disorder with anxiety: Secondary | ICD-10-CM

## 2018-04-04 LAB — CBC WITH DIFFERENTIAL/PLATELET
BASOS PCT: 0.9 % (ref 0.0–3.0)
Basophils Absolute: 0 10*3/uL (ref 0.0–0.1)
EOS PCT: 3.6 % (ref 0.0–5.0)
Eosinophils Absolute: 0.2 10*3/uL (ref 0.0–0.7)
HCT: 43.5 % (ref 36.0–46.0)
HEMOGLOBIN: 14.5 g/dL (ref 12.0–15.0)
Lymphocytes Relative: 40.6 % (ref 12.0–46.0)
Lymphs Abs: 1.9 10*3/uL (ref 0.7–4.0)
MCHC: 33.3 g/dL (ref 30.0–36.0)
MCV: 90.8 fl (ref 78.0–100.0)
MONOS PCT: 9 % (ref 3.0–12.0)
Monocytes Absolute: 0.4 10*3/uL (ref 0.1–1.0)
Neutro Abs: 2.2 10*3/uL (ref 1.4–7.7)
Neutrophils Relative %: 45.9 % (ref 43.0–77.0)
Platelets: 281 10*3/uL (ref 150.0–400.0)
RBC: 4.79 Mil/uL (ref 3.87–5.11)
RDW: 13.5 % (ref 11.5–15.5)
WBC: 4.7 10*3/uL (ref 4.0–10.5)

## 2018-04-04 LAB — COMPREHENSIVE METABOLIC PANEL
ALBUMIN: 4.2 g/dL (ref 3.5–5.2)
ALK PHOS: 96 U/L (ref 39–117)
ALT: 54 U/L — ABNORMAL HIGH (ref 0–35)
AST: 40 U/L — ABNORMAL HIGH (ref 0–37)
BUN: 15 mg/dL (ref 6–23)
CALCIUM: 9.4 mg/dL (ref 8.4–10.5)
CO2: 31 mEq/L (ref 19–32)
Chloride: 104 mEq/L (ref 96–112)
Creatinine, Ser: 0.91 mg/dL (ref 0.40–1.20)
GFR: 68.66 mL/min (ref >60.00)
Glucose, Bld: 120 mg/dL — ABNORMAL HIGH (ref 70–99)
POTASSIUM: 3.7 meq/L (ref 3.5–5.1)
Sodium: 140 mEq/L (ref 135–145)
Total Bilirubin: 0.9 mg/dL (ref 0.2–1.2)
Total Protein: 7 g/dL (ref 6.0–8.3)

## 2018-04-04 LAB — LIPID PANEL
CHOLESTEROL: 283 mg/dL — AB (ref 0–200)
HDL: 47.6 mg/dL (ref >39.00)
LDL Cholesterol: 208 mg/dL — ABNORMAL HIGH (ref 0–99)
NonHDL: 235.73
TRIGLYCERIDES: 139 mg/dL (ref 0.0–149.0)
Total CHOL/HDL Ratio: 6
VLDL: 27.8 mg/dL (ref 0.0–40.0)

## 2018-04-04 LAB — HEMOGLOBIN A1C: Hgb A1c MFr Bld: 5.8 % (ref 4.6–6.5)

## 2018-04-04 LAB — TSH: TSH: 3.13 u[IU]/mL (ref 0.35–4.50)

## 2018-04-04 MED ORDER — ATORVASTATIN CALCIUM 20 MG PO TABS: 20.0000 mg | ORAL_TABLET | Freq: Every day | ORAL | 11 refills | Status: DC

## 2018-04-04 MED ORDER — MONTELUKAST SODIUM 10 MG PO TABS: ORAL_TABLET | ORAL | 3 refills | Status: DC

## 2018-04-04 MED ORDER — ESOMEPRAZOLE MAGNESIUM 40 MG PO CPDR: DELAYED_RELEASE_CAPSULE | ORAL | 11 refills | Status: DC

## 2018-04-04 MED ORDER — FLUTICASONE PROPIONATE 50 MCG/ACT NA SUSP: 2.0000 | NASAL | 11 refills | Status: DC | PRN

## 2018-04-04 MED ORDER — BUSPIRONE HCL 15 MG PO TABS: 15.0000 mg | ORAL_TABLET | Freq: Two times a day (BID) | ORAL | 11 refills | Status: DC

## 2018-04-04 NOTE — Assessment & Plan Note (Signed)
No longer on detrol Wears a pad

## 2018-04-04 NOTE — Assessment & Plan Note (Signed)
Routine exam and pap  Also gc/chlamydia screening and HPV

## 2018-04-04 NOTE — Assessment & Plan Note (Addendum)
Found out husband cheated- really trying to work on it She is in individual counseling but wants ref for couples counseling  Self care is much better-walking 5-6 mi per day/eating better and wants to loose wt (commended) Still also caring for mother with alzheimer's Reviewed stressors/ coping techniques/symptoms/ support sources/ tx options and side effects in detail today Overall good coping technique Will ref for couples counseling  buspar refilled

## 2018-04-04 NOTE — Patient Instructions (Addendum)
Flu shot today  Labs today  Pap and STD screening today  We will refer - for couples counseling   Also we did referral for colonoscopy   Don't forget to schedule your mammogram   Keep taking good care of yourself

## 2018-04-04 NOTE — Assessment & Plan Note (Signed)
A1C today  Has worked hard on diet and exercise lately

## 2018-04-04 NOTE — Assessment & Plan Note (Signed)
Reviewed health habits including diet and exercise and skin cancer prevention Reviewed appropriate screening tests for age  Also reviewed health mt list, fam hx and immunization status , as well as social and family history   See HPI Off meds for a while w/o ins-refilled chronic medications incl statin  Stressors are high -but better self care Planning couples counseling-ref done Labs ordered including std screening  Flu shot today  Ref for colonoscopy  She will schedule her own mammogram

## 2018-04-04 NOTE — Assessment & Plan Note (Signed)
Stable on nexium (severe if she missed medication) Working on lifestyle habits

## 2018-04-04 NOTE — Assessment & Plan Note (Signed)
No longer taking detrol  Wears a pad for stress incontinence

## 2018-04-04 NOTE — Assessment & Plan Note (Signed)
Disc goals for lipids and reasons to control them Rev last labs with pt Rev low sat fat diet in detail Lipid panel today off lipitor (will be high)-plans to re start it however She has changed diet significantly

## 2018-04-04 NOTE — Assessment & Plan Note (Signed)
Overdue for 5 y recall colonoscopy with Dr Watt Climes  Will ref for that (was due in 2017)
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
## 2018-04-04 NOTE — Assessment & Plan Note (Signed)
With husband's infidelity-he was checked at the health dept  HIV/ Hep C/ RPR and gc/chlamydia and HPV screen ordered

## 2018-04-05 LAB — CYTOLOGY - PAP
CHLAMYDIA, DNA PROBE: NEGATIVE
DIAGNOSIS: NEGATIVE
HPV: NOT DETECTED
Neisseria Gonorrhea: NEGATIVE

## 2018-04-05 LAB — RPR: RPR Ser Ql: NONREACTIVE

## 2018-04-05 LAB — HIV ANTIBODY (ROUTINE TESTING W REFLEX): HIV: NONREACTIVE

## 2018-04-05 LAB — HEPATITIS C ANTIBODY
Hepatitis C Ab: NONREACTIVE
SIGNAL TO CUT-OFF: 0.02 (ref <1.00)

## 2018-04-18 ENCOUNTER — Ambulatory Visit: Payer: BC Managed Care – PPO | Admitting: Psychology

## 2018-04-18 DIAGNOSIS — F418 Other specified anxiety disorders: Secondary | ICD-10-CM

## 2018-04-22 ENCOUNTER — Other Ambulatory Visit: Payer: Self-pay | Admitting: Family Medicine

## 2018-04-22 DIAGNOSIS — Z1231 Encounter for screening mammogram for malignant neoplasm of breast: Secondary | ICD-10-CM

## 2018-04-30 ENCOUNTER — Ambulatory Visit: Payer: BC Managed Care – PPO | Admitting: Psychology

## 2018-05-02 ENCOUNTER — Ambulatory Visit: Payer: BC Managed Care – PPO | Admitting: Psychology

## 2018-05-28 ENCOUNTER — Encounter: Payer: Self-pay | Admitting: *Deleted

## 2018-05-29 ENCOUNTER — Ambulatory Visit: Payer: Self-pay

## 2018-06-05 ENCOUNTER — Encounter: Payer: Self-pay | Admitting: Internal Medicine

## 2018-06-05 ENCOUNTER — Other Ambulatory Visit (INDEPENDENT_AMBULATORY_CARE_PROVIDER_SITE_OTHER): Payer: BC Managed Care – PPO

## 2018-06-05 ENCOUNTER — Ambulatory Visit: Payer: BC Managed Care – PPO | Admitting: Internal Medicine

## 2018-06-05 VITALS — BP 122/84 | HR 80 | Ht 68.0 in | Wt 194.2 lb

## 2018-06-05 DIAGNOSIS — R198 Other specified symptoms and signs involving the digestive system and abdomen: Secondary | ICD-10-CM

## 2018-06-05 DIAGNOSIS — K760 Fatty (change of) liver, not elsewhere classified: Secondary | ICD-10-CM

## 2018-06-05 DIAGNOSIS — N8110 Cystocele, unspecified: Secondary | ICD-10-CM | POA: Diagnosis not present

## 2018-06-05 DIAGNOSIS — N816 Rectocele: Secondary | ICD-10-CM | POA: Diagnosis not present

## 2018-06-05 LAB — CBC WITH DIFFERENTIAL/PLATELET
Basophils Absolute: 0 10*3/uL (ref 0.0–0.1)
Basophils Relative: 0.7 % (ref 0.0–3.0)
Eosinophils Absolute: 0.1 10*3/uL (ref 0.0–0.7)
Eosinophils Relative: 1.5 % (ref 0.0–5.0)
HCT: 43.3 % (ref 36.0–46.0)
Hemoglobin: 14.6 g/dL (ref 12.0–15.0)
Lymphocytes Relative: 30 % (ref 12.0–46.0)
Lymphs Abs: 1.8 10*3/uL (ref 0.7–4.0)
MCHC: 33.7 g/dL (ref 30.0–36.0)
MCV: 89.7 fl (ref 78.0–100.0)
MONOS PCT: 8.6 % (ref 3.0–12.0)
Monocytes Absolute: 0.5 10*3/uL (ref 0.1–1.0)
Neutro Abs: 3.6 10*3/uL (ref 1.4–7.7)
Neutrophils Relative %: 59.2 % (ref 43.0–77.0)
Platelets: 270 10*3/uL (ref 150.0–400.0)
RBC: 4.83 Mil/uL (ref 3.87–5.11)
RDW: 13.1 % (ref 11.5–15.5)
WBC: 6.2 10*3/uL (ref 4.0–10.5)

## 2018-06-05 LAB — PROTIME-INR
INR: 1 ratio (ref 0.8–1.0)
Prothrombin Time: 11.8 s (ref 9.6–13.1)

## 2018-06-05 MED ORDER — VITAMIN E 180 MG (400 UNIT) PO CAPS: 800.0000 [IU] | ORAL_CAPSULE | Freq: Every day | ORAL | 5 refills | Status: AC

## 2018-06-05 NOTE — Patient Instructions (Signed)
Your provider has requested that you go to the basement level for lab work before leaving today. Press "B" on the elevator. The lab is located at the first door on the left as you exit the elevator.  You will be due for a recall colonoscopy in 08/2020. We will send you a reminder in the mail when it gets closer to that time.  Please purchase the following medications over the counter and take as directed: Benefiber 1 tablespoon daily x 1 week, then work up to 2 tablespoons daily thereafter.  Continue Vitamin E 800 IU daily.  Please consider seeing [place] Pink with urogynecology for your rectocele/cystocele.  If you are age 82 or older, your body mass index should be between 23-30. Your Body mass index is 29.54 kg/m. If this is out of the aforementioned range listed, please consider follow up with your Primary Care Provider.  If you are age 96 or younger, your body mass index should be between 19-25. Your Body mass index is 29.54 kg/m. If this is out of the aformentioned range listed, please consider follow up with your Primary Care Provider.

## 2018-06-05 NOTE — Progress Notes (Signed)
Patient ID: Michele [name], female   DOB: 1966/02/21, 52 y.o.   MRN: [id number] HPI: Michele [name] is a 52 year old female with a past medical history of GERD, fatty liver, sigmoid hyperplastic colon polyp, rectocele who was seen for follow-up at the request of Dr. Glori Bickers to consider colon cancer screening.  She now has insurance and reports that her my chart is telling her colonoscopy is due.  She is here alone today.  She reports that on the whole she is feeling well.  She does struggle with alternating diarrhea and constipation.  She also reports her rectocele is symptomatic and seems to be worsening.  She reports needing to apply perineal pressure in order to aid with evacuation.  This is most necessary when stools are hard or more formed.  She has intermittent liquid loose stool which can be urgent and at times has led to accidents.  She also has issues with leakage of urine and some fecal smearing.  She wears a Geographical information systems officer.  She reports when she drinks coffee her stools tend to stay softer and are easier to pass.  No rectal bleeding.  She reports her rectocele and cystocele were diagnosed by gynecology some years ago and at that point not felt surgical.  She has been actively exercising and monitoring/modifying her diet.  She is lost 25 to 30 pounds intentionally in the last 3 to 4 months.  She has increased her water intake and her exercise primarily with walking.  She very rarely if ever drinks alcohol.  She takes vitamin E  but has been somewhat inconsistent with this therapy.  She also takes atorvastatin daily, buspirone, Nexium 40 mg daily.  Reports good control of heartburn with no dysphagia or odynophagia.  No nausea or vomiting.  Past Medical History:  Diagnosis Date  . Abnormal Pap smear of cervix    cryotx  . Anxiety   . Arthritis   . Asthma   . Depression   . Endometriosis   . Fatty liver   . Gastric polyp   . GERD (gastroesophageal reflux disease)   . Hemorrhoids   . Hiatal  hernia   . HLD (hyperlipidemia)   . Hyperplastic colon polyp   . IBS (irritable bowel syndrome)   . Iron deficiency anemia 1/12  . Pneumonia   . Rectocele   . Rotator cuff tear, left 2009  . Syncope   . Tachycardia    s/p negative cardiac work up with echo  . Uterine fibroid [id number]    Past Surgical History:  Procedure Laterality Date  . CARPAL TUNNEL RELEASE Bilateral   . COLONOSCOPY  1999   small polyps, hemms  . ESOPHAGOGASTRODUODENOSCOPY  2/12   gastric polyp and HH  . HYSTEROSCOPY    . LAPAROSCOPY  2006   endometriosis  . ROTATOR CUFF REPAIR Left     Outpatient Medications Prior to Visit  Medication Sig Dispense Refill  . atorvastatin (LIPITOR) 20 MG tablet Take 1 tablet (20 mg total) by mouth daily. 30 tablet 11  . busPIRone (BUSPAR) 15 MG tablet Take 1 tablet (15 mg total) by mouth 2 (two) times daily. Take 1/2 pill twice daily for 1-2 weeks and then increase to 1 pill twice daily 60 tablet 11  . diphenhydrAMINE (BENADRYL) 25 MG tablet Take 75 mg by mouth at bedtime as needed for sleep.    Marland Kitchen esomeprazole (NEXIUM) 40 MG capsule TAKE 1 CAPSULE BY MOUTH EVERY DAY IN THE MORNING 30 capsule 11  . fluticasone (FLONASE)  50 MCG/ACT nasal spray Place 2 sprays into both nostrils as needed. 16 g 11  . montelukast (SINGULAIR) 10 MG tablet TAKE 1 TABLET BY MOUTH EVERYDAY AT BEDTIME 90 tablet 3  . naproxen sodium (ALEVE) 220 MG tablet Take 440 mg by mouth daily as needed (pain).    . Prenatal Vit-Fe Fumarate-FA (PRENATAL PO) Take 1 tablet by mouth daily.    . vitamin E (VITAMIN E) 400 UNIT capsule Take 2 capsules (800 Units total) by mouth daily. 60 capsule 3  . albuterol (PROAIR HFA) 108 (90 Base) MCG/ACT inhaler Inhale 2 puffs into the lungs every 4 (four) hours as needed for wheezing or shortness of breath. (Patient not taking: Reported on 02/25/2018) 1 Inhaler 3  . budesonide-formoterol (SYMBICORT) 160-4.5 MCG/ACT inhaler Inhale 2 puffs into the lungs 2 (two) times daily. (Patient  not taking: Reported on 04/04/2018) 1 Inhaler 5  . levocetirizine (XYZAL) 5 MG tablet Take 1 tablet (5 mg total) by mouth every evening. 30 tablet 5  . Olopatadine HCl (PATADAY) 0.2 % SOLN Place 1 drop into both eyes 1 day or 1 dose. (Patient taking differently: Place 1 drop into both eyes daily as needed. ) 1 Bottle 5   No facility-administered medications prior to visit.     Allergies  Allergen Reactions  . Hydrocodone-Acetaminophen Itching  . Nsaids Nausea And Vomiting       . Povidone-Iodine Rash       . Propoxyphene N-Acetaminophen Itching         Family History  Problem Relation Age of Onset  . Hypertension Father   . Diabetes Father   . Stroke Father   . Migraines Father   . Anemia Father        iron def  . Heart disease Father        pacemaker, a fib, CHF  . Dementia Mother   . Allergic rhinitis Mother   . Diabetes Sister   . Hyperlipidemia Sister   . Neuropathy Daughter   . Anxiety disorder Daughter   . Angioedema Neg Hx   . Asthma Neg Hx   . Atopy Neg Hx   . Eczema Neg Hx   . Immunodeficiency Neg Hx   . Urticaria Neg Hx     Social History   Tobacco Use  . Smoking status: Former Smoker    Packs/day: 0.25    Years: 5.00    Pack years: 1.25    Types: Cigarettes    Last attempt to quit: 07/03/1992    Years since quitting: 25.9  . Smokeless tobacco: Never Used  Substance Use Topics  . Alcohol use: Yes    Alcohol/week: 0.0 standard drinks    Comment: Rarely, once every few months  . Drug use: No    ROS: As per history of present illness, otherwise negative  BP 122/84   Pulse 80   Ht 5\' 8"  (1.727 m)   Wt 194 lb 4 oz (88.1 kg)   LMP 03/06/2014   BMI 29.54 kg/m  Constitutional: Well-developed and well-nourished. No distress. HEENT: Normocephalic and atraumatic. Conjunctivae are normal.  No scleral icterus. Neck: Neck supple. Trachea midline. Cardiovascular: Normal rate, regular rhythm and intact distal pulses. No M/R/G Pulmonary/chest: Effort  normal and breath sounds normal. No wheezing, rales or rhonchi. Abdominal: Soft, nontender, nondistended. Bowel sounds active throughout. There are no masses palpable. No hepatosplenomegaly. Extremities: no clubbing, cyanosis, or edema Neurological: Alert and oriented to person place and time. Skin: Skin is warm and dry.  Psychiatric: Normal mood and affect. Behavior is normal.  RELEVANT LABS AND IMAGING: CBC    Component Value Date/Time   WBC 6.2 06/05/2018 1556   RBC 4.83 06/05/2018 1556   HGB 14.6 06/05/2018 1556   HGB 13.5 03/28/2013 1043   HCT 43.3 06/05/2018 1556   HCT 39.7 03/28/2013 1043   PLT 270.0 06/05/2018 1556   PLT 249 03/28/2013 1043   MCV 89.7 06/05/2018 1556   MCV 89.5 03/28/2013 1043   MCH 30.7 02/25/2018 1945   MCHC 33.7 06/05/2018 1556   RDW 13.1 06/05/2018 1556   RDW 12.9 03/28/2013 1043   LYMPHSABS 1.8 06/05/2018 1556   LYMPHSABS 1.6 03/28/2013 1043   MONOABS 0.5 06/05/2018 1556   MONOABS 0.5 03/28/2013 1043   EOSABS 0.1 06/05/2018 1556   EOSABS 0.3 03/28/2013 1043   BASOSABS 0.0 06/05/2018 1556   BASOSABS 0.0 03/28/2013 1043    CMP     Component Value Date/Time   NA 140 04/04/2018 0921   NA 140 03/28/2013 1044   K 3.7 04/04/2018 0921   K 4.3 03/28/2013 1044   CL 104 04/04/2018 0921   CO2 31 04/04/2018 0921   CO2 27 03/28/2013 1044   GLUCOSE 120 (H) 04/04/2018 0921   GLUCOSE 123 03/28/2013 1044   BUN 15 04/04/2018 0921   BUN 8.5 03/28/2013 1044   CREATININE 0.91 04/04/2018 0921   CREATININE 0.8 03/28/2013 1044   CALCIUM 9.4 04/04/2018 0921   CALCIUM 9.2 03/28/2013 1044   PROT 7.0 04/04/2018 0921   ALBUMIN 4.2 04/04/2018 0921   AST 40 (H) 04/04/2018 0921   ALT 54 (H) 04/04/2018 0921   ALKPHOS 96 04/04/2018 0921   BILITOT 0.9 04/04/2018 0921   GFRNONAA 55 (L) 02/25/2018 1945   GFRAA >60 02/25/2018 1945    ASSESSMENT/PLAN:  52 year old female with a past medical history of GERD, fatty liver, sigmoid hyperplastic colon polyp, rectocele  who was seen for follow-up at the request of Dr. Glori Bickers to consider colon cancer screening.   1.  Alternating diarrhea and constipation/rectocele --part of her issue is likely anatomic.  She may have constipation predominance with overflow diarrhea.  There are no alarm symptoms and no rectal bleeding.  We discussed how the rectocele may be causing her current bowel habits, which are actually not new.  I recommended that she begin Benefiber 1 working to 2 tablespoons daily to help prevent hard stool but also help guard against loose stool with fecal smearing.  I also recommended that she be seen by uro-gynecologist, Dr. Zigmund Daniel at [facility].  Referral was offered but she would like to research this a bit and call for her own appointment.  If she runs into issues I asked her let me know.  2.  Fatty liver --I would expect this to improve significantly with weight loss.  I congratulated her on her weight loss and exercise program and encouraged her to continue this exercise program and dietary modification.  Repeat liver enzymes today and also check INR.  Previous her ANA was negative as was her viral evaluation.  Ultrasound also showed fatty liver without advanced scarring or cirrhosis.  There is no evidence for decompensated liver disease or stigmata of chronic liver disease on exam today. -- Continue vitamin E 800 IU daily  3.  Colon cancer screening --no history of adenomatous polyps nor family history of colon cancer.  Her sigmoid polyps were subcentimeter and hyperplastic only.  I explained that this would make her average risk with her next  colonoscopy being recommended in February 2022  Return in 6 months, sooner if necessary.  Follow-up appointment for continuity particularly with issue 1 and 2  25 minutes spent with the patient today. Greater than 50% was spent in counseling and coordination of care with the patient    Cc:[address], [place], [place],  [id number]

## 2018-06-06 NOTE — Addendum Note (Signed)
Addended by: Larina Bras on: 06/06/2018 01:48 PM   Modules accepted: Orders

## 2018-06-12 ENCOUNTER — Other Ambulatory Visit (INDEPENDENT_AMBULATORY_CARE_PROVIDER_SITE_OTHER): Payer: BC Managed Care – PPO

## 2018-06-12 DIAGNOSIS — R198 Other specified symptoms and signs involving the digestive system and abdomen: Secondary | ICD-10-CM | POA: Diagnosis not present

## 2018-06-12 DIAGNOSIS — K760 Fatty (change of) liver, not elsewhere classified: Secondary | ICD-10-CM | POA: Diagnosis not present

## 2018-06-12 DIAGNOSIS — N816 Rectocele: Secondary | ICD-10-CM

## 2018-06-12 DIAGNOSIS — N8110 Cystocele, unspecified: Secondary | ICD-10-CM

## 2018-06-12 LAB — COMPREHENSIVE METABOLIC PANEL
ALK PHOS: 111 U/L (ref 39–117)
ALT: 36 U/L — ABNORMAL HIGH (ref 0–35)
AST: 26 U/L (ref 0–37)
Albumin: 4.5 g/dL (ref 3.5–5.2)
BUN: 10 mg/dL (ref 6–23)
CHLORIDE: 105 meq/L (ref 96–112)
CO2: 29 mEq/L (ref 19–32)
Calcium: 9.8 mg/dL (ref 8.4–10.5)
Creatinine, Ser: 0.92 mg/dL (ref 0.40–1.20)
GFR: 67.75 mL/min (ref >60.00)
Glucose, Bld: 120 mg/dL — ABNORMAL HIGH (ref 70–99)
Potassium: 4.4 mEq/L (ref 3.5–5.1)
Sodium: 142 mEq/L (ref 135–145)
Total Bilirubin: 0.6 mg/dL (ref 0.2–1.2)
Total Protein: 7.3 g/dL (ref 6.0–8.3)

## 2018-07-10 ENCOUNTER — Encounter: Payer: Self-pay | Admitting: Family Medicine

## 2018-07-10 DIAGNOSIS — F32A Depression, unspecified: Secondary | ICD-10-CM

## 2018-07-10 DIAGNOSIS — F419 Anxiety disorder, unspecified: Principal | ICD-10-CM

## 2018-07-10 DIAGNOSIS — F329 Major depressive disorder, single episode, unspecified: Secondary | ICD-10-CM

## 2018-07-15 ENCOUNTER — Ambulatory Visit
Admission: RE | Admit: 2018-07-15 | Discharge: 2018-07-15 | Disposition: A | Discharge status: Home / Self Care | Payer: BC Managed Care – PPO | Source: Ambulatory Visit | Attending: Family Medicine | Admitting: Family Medicine

## 2018-07-15 DIAGNOSIS — Z1231 Encounter for screening mammogram for malignant neoplasm of breast: Secondary | ICD-10-CM

## 2018-07-23 ENCOUNTER — Ambulatory Visit (INDEPENDENT_AMBULATORY_CARE_PROVIDER_SITE_OTHER): Payer: BC Managed Care – PPO | Admitting: Psychology

## 2018-07-23 DIAGNOSIS — F4323 Adjustment disorder with mixed anxiety and depressed mood: Secondary | ICD-10-CM | POA: Diagnosis not present

## 2018-07-26 ENCOUNTER — Ambulatory Visit (HOSPITAL_COMMUNITY): Payer: BC Managed Care – PPO | Admitting: Psychiatry

## 2018-08-07 ENCOUNTER — Ambulatory Visit (INDEPENDENT_AMBULATORY_CARE_PROVIDER_SITE_OTHER): Payer: BC Managed Care – PPO | Admitting: Psychology

## 2018-08-07 ENCOUNTER — Ambulatory Visit: Payer: BC Managed Care – PPO | Admitting: Psychology

## 2018-08-07 DIAGNOSIS — F4323 Adjustment disorder with mixed anxiety and depressed mood: Secondary | ICD-10-CM

## 2018-08-14 ENCOUNTER — Ambulatory Visit: Payer: BC Managed Care – PPO | Admitting: Psychology

## 2018-08-21 ENCOUNTER — Ambulatory Visit: Payer: BC Managed Care – PPO | Admitting: Psychology

## 2018-08-21 DIAGNOSIS — F4323 Adjustment disorder with mixed anxiety and depressed mood: Secondary | ICD-10-CM

## 2018-08-23 ENCOUNTER — Telehealth: Payer: BC Managed Care – PPO | Admitting: Nurse Practitioner

## 2018-08-23 ENCOUNTER — Encounter: Payer: Self-pay | Admitting: Family Medicine

## 2018-08-23 DIAGNOSIS — L0100 Impetigo, unspecified: Secondary | ICD-10-CM

## 2018-08-23 MED ORDER — MUPIROCIN CALCIUM 2 % EX CREA: 1.0000 "application " | TOPICAL_CREAM | Freq: Two times a day (BID) | CUTANEOUS | 0 refills | Status: DC

## 2018-08-23 NOTE — Progress Notes (Signed)
E Visit for Rash  We are sorry that you are not feeling well. Here is how we plan to help!  According to the picture you sent there appears to be a slight honey comb appearance which is indication of impetigo. Impetigo is a skin infection. I have rx Topical mupiricin to be used 2x a day Good handwashing is encouraged.  HOME CARE:   Take cool showers and avoid direct sunlight.  Apply cool compress or wet dressings.  Take a bath in an oatmeal bath.  Sprinkle content of one Aveeno packet under running faucet with comfortably warm water.  Bathe for 15-20 minutes, 1-2 times daily.  Pat dry with a towel. Do not rub the rash.  Use hydrocortisone cream.  Take an antihistamine like Benadryl for widespread rashes that itch.  The adult dose of Benadryl is 25-50 mg by mouth 4 times daily.  Caution:  This type of medication may cause sleepiness.  Do not drink alcohol, drive, or operate dangerous machinery while taking antihistamines.  Do not take these medications if you have prostate enlargement.  Read package instructions thoroughly on all medications that you take.  GET HELP RIGHT AWAY IF:   Symptoms don't go away after treatment.  Severe itching that persists.  If you rash spreads or swells.  If you rash begins to smell.  If it blisters and opens or develops a yellow-[place]anery crust.  You develop a fever.  You have a sore throat.  You become short of breath.  MAKE SURE YOU:  Understand these instructions. Will watch your condition. Will get help right away if you are not doing well or get worse.  Thank you for choosing an e-visit. Your e-visit answers were reviewed by a board certified advanced clinical practitioner to complete your personal care plan. Depending upon the condition, your plan could have included both over the counter or prescription medications. Please review your pharmacy choice. Be sure that the pharmacy you have chosen is open so that you can pick up your  prescription now.  If there is a problem you may message your provider in [place] to have the prescription routed to another pharmacy. Your safety is important to [place]. If you have drug allergies check your prescription carefully.  For the next 24 hours, you can use MyChart to ask questions about today's visit, request a non-urgent call back, or ask for a work or school excuse from your e-visit provider. You will get an email in the next two days asking about your experience. I hope that your e-visit has been valuable and will speed your recovery.   5 minutes spent reviewing and documenting in chart.

## 2018-08-27 ENCOUNTER — Ambulatory Visit: Payer: BC Managed Care – PPO | Admitting: Family Medicine

## 2018-08-27 ENCOUNTER — Encounter: Payer: Self-pay | Admitting: Family Medicine

## 2018-08-27 VITALS — BP 126/80 | HR 89 | Temp 98.3°F | Ht 67.75 in | Wt 197.0 lb

## 2018-08-27 DIAGNOSIS — R0602 Shortness of breath: Secondary | ICD-10-CM

## 2018-08-27 DIAGNOSIS — L0100 Impetigo, unspecified: Secondary | ICD-10-CM | POA: Diagnosis not present

## 2018-08-27 DIAGNOSIS — R072 Precordial pain: Secondary | ICD-10-CM | POA: Insufficient documentation

## 2018-08-27 DIAGNOSIS — R079 Chest pain, unspecified: Secondary | ICD-10-CM

## 2018-08-27 DIAGNOSIS — R002 Palpitations: Secondary | ICD-10-CM | POA: Diagnosis not present

## 2018-08-27 NOTE — Patient Instructions (Signed)
Impetigo looks to be improving  Gentle soap and water and continue the ointment  Use it on any new spots also   If it gets worse-please call and let me know and we will start you on an antibiotic   Our office will call you about a cardiology referral  If symptoms get severe- go to the ER

## 2018-08-27 NOTE — Assessment & Plan Note (Signed)
In ams with palpitations and sob  Reassuring EKG Some cardiac risk factors  Ref to cardiology  If this persists or worsens inst to go to ED

## 2018-08-27 NOTE — Progress Notes (Signed)
Subjective:    Patient ID: Michele [name], female    DOB: 01-Sep-1965, 53 y.o.   MRN: [id number]  HPI Here for rash on face - e visit dx with impetigo  Was px bactroban ointment Little bumps under nose  Then spread towards mouth  Now in L nasolabial fold  One area had a little crust on it / not weepy   Now rash looks like it is drying up  One new bump    Also mentions more sob/ and chest discomfort lately   Has been waking up in ams with elevated HR and some chest discomfort  She thinks her HR is elevated resting (70s-80s) at like 3 am  Pain over mid chest and a little to L shoulder (? If she feels short of breath)  This wakes her up  Usually lasts 1 hour or less Does not feel anxious or panicked  Using her phone - says her 02 goes occ low (? 80) -during the day She gets a little sob with hiking (including inclines)- trying to get in shape  Occ pain then also   No PND or orthopnea  No leg or ankle swelling     Wt Readings from Last 3 Encounters:  08/27/18 197 lb (89.4 kg)  06/05/18 194 lb 4 oz (88.1 kg)  04/04/18 204 lb 8 oz (92.8 kg)   30.18 kg/m   Takes atorvastatin for cholesterol- last lipid panel was off of it  Lab Results  Component Value Date   CHOL 283 (H) 04/04/2018   HDL 47.60 04/04/2018   LDLCALC 208 (H) 04/04/2018   LDLDIRECT 202.8 03/11/2012   TRIG 139.0 04/04/2018   CHOLHDL 6 04/04/2018   BP Readings from Last 3 Encounters:  08/27/18 126/80  06/05/18 122/84  04/04/18 126/78   Pulse Readings from Last 3 Encounters:  08/27/18 89  06/05/18 80  04/04/18 66   Prediabetes Lab Results  Component Value Date   HGBA1C 5.8 04/04/2018   EKG today: NSR with rate of 80 and no acute changes   Father has a fib and CHF and pacer for bradycardia   Patient Active Problem List   Diagnosis Date Noted  . Palpitations 08/27/2018  . Chest pain 08/27/2018  . Shortness of breath on exertion 08/27/2018  . Impetigo 08/27/2018  . Screen for STD (sexually  transmitted disease) 04/04/2018  . Allergic rhinitis 01/10/2017  . RUQ abdominal pain 04/04/2016  . Adjustment reaction with anxious mood 08/18/2015  . Arm paresthesia, right 07/14/2015  . Diarrhea 10/13/2014  . Rectal pressure 10/01/2013  . Rectocele 10/01/2013  . Skin tag 04/23/2013  . Mixed incontinence 04/23/2013  . Encounter for routine gynecological examination 04/01/2012  . Prediabetes 04/01/2012  . Routine general medical examination at a health care facility 03/11/2012  . GERD 08/26/2010  . History of anemia 07/19/2010  . FREQUENCY, URINARY 07/06/2010  . MICROSCOPIC HEMATURIA 06/09/2010  . History of colonic polyps 11/15/2007  . IBS 10/08/2007  . ENDOMETRIOSIS 10/08/2007  . Hyperlipidemia 10/30/2006  . Anxiety and depression 10/30/2006  . HEMORRHOIDS, INTERNAL 10/30/2006  . Asthma 10/30/2006  . MIGRAINES, HX OF 10/30/2006   Past Medical History:  Diagnosis Date  . Abnormal Pap smear of cervix    cryotx  . Anxiety   . Arthritis   . Asthma   . Depression   . Endometriosis   . Fatty liver   . Gastric polyp   . GERD (gastroesophageal reflux disease)   . Hemorrhoids   . Hiatal  hernia   . HLD (hyperlipidemia)   . Hyperplastic colon polyp   . IBS (irritable bowel syndrome)   . Iron deficiency anemia 1/12  . Pneumonia   . Rectocele   . Rotator cuff tear, left 2009  . Syncope   . Tachycardia    s/p negative cardiac work up with echo  . Uterine fibroid 130865   Past Surgical History:  Procedure Laterality Date  . CARPAL TUNNEL RELEASE Bilateral   . COLONOSCOPY  1999   small polyps, hemms  . ESOPHAGOGASTRODUODENOSCOPY  2/12   gastric polyp and HH  . HYSTEROSCOPY    . LAPAROSCOPY  2006   endometriosis  . ROTATOR CUFF REPAIR Left    Social History   Tobacco Use  . Smoking status: Former Smoker    Packs/day: 0.25    Years: 5.00    Pack years: 1.25    Types: Cigarettes    Last attempt to quit: 07/03/1992    Years since quitting: 26.1  . Smokeless  tobacco: Never Used  Substance Use Topics  . Alcohol use: Yes    Alcohol/week: 0.0 standard drinks    Comment: Rarely, once every few months  . Drug use: No   Family History  Problem Relation Age of Onset  . Hypertension Father   . Diabetes Father   . Stroke Father   . Migraines Father   . Barrett's esophagus Father   . Anemia Father        iron def  . Heart disease Father        pacemaker, a fib, CHF  . Dementia Mother   . Allergic rhinitis Mother   . Diabetes Sister   . Hyperlipidemia Sister   . Neuropathy Daughter   . Anxiety disorder Daughter   . Angioedema Neg Hx   . Asthma Neg Hx   . Atopy Neg Hx   . Eczema Neg Hx   . Immunodeficiency Neg Hx   . Urticaria Neg Hx    Allergies  Allergen Reactions  . Hydrocodone-Acetaminophen Itching  . Nsaids Nausea And Vomiting       . Povidone-Iodine Rash       . Propoxyphene N-Acetaminophen Itching        Current Outpatient Medications on File Prior to Visit  Medication Sig Dispense Refill  . atorvastatin (LIPITOR) 20 MG tablet Take 1 tablet (20 mg total) by mouth daily. 30 tablet 11  . busPIRone (BUSPAR) 15 MG tablet Take 1 tablet (15 mg total) by mouth 2 (two) times daily. Take 1/2 pill twice daily for 1-2 weeks and then increase to 1 pill twice daily 60 tablet 11  . diphenhydrAMINE (BENADRYL) 25 MG tablet Take 75 mg by mouth at bedtime as needed for sleep.    Marland Kitchen esomeprazole (NEXIUM) 40 MG capsule TAKE 1 CAPSULE BY MOUTH EVERY DAY IN THE MORNING 30 capsule 11  . fluticasone (FLONASE) 50 MCG/ACT nasal spray Place 2 sprays into both nostrils as needed. 16 g 11  . montelukast (SINGULAIR) 10 MG tablet TAKE 1 TABLET BY MOUTH EVERYDAY AT BEDTIME 90 tablet 3  . mupirocin cream (BACTROBAN) 2 % Apply 1 application topically 2 (two) times daily. 15 g 0  . naproxen sodium (ALEVE) 220 MG tablet Take 440 mg by mouth daily as needed (pain).    . Prenatal Vit-Fe Fumarate-FA (PRENATAL PO) Take 1 tablet by mouth daily.    . vitamin E  (VITAMIN E) 400 UNIT capsule Take 2 capsules (800 Units total) by mouth daily.  60 capsule 5  . [DISCONTINUED] norethindrone-ethinyl estradiol-iron (MICROGESTIN FE1.5/30) 1.5-30 MG-MCG tablet Take 1 tablet by mouth daily.       No current facility-administered medications on file prior to visit.      Review of Systems  Constitutional: Positive for fatigue. Negative for activity change, appetite change, fever and unexpected weight change.  HENT: Negative for congestion, ear pain, rhinorrhea, sinus pressure and sore throat.   Eyes: Negative for pain, redness and visual disturbance.  Respiratory: Positive for shortness of breath. Negative for cough, chest tightness, wheezing and stridor.   Cardiovascular: Positive for chest pain. Negative for palpitations and leg swelling.  Gastrointestinal: Negative for abdominal pain, blood in stool, constipation and diarrhea.  Endocrine: Negative for polydipsia and polyuria.  Genitourinary: Negative for dysuria, frequency and urgency.  Musculoskeletal: Negative for arthralgias, back pain and myalgias.  Skin: Positive for rash. Negative for pallor and wound.  Allergic/Immunologic: Negative for environmental allergies.  Neurological: Negative for dizziness, syncope and headaches.  Hematological: Negative for adenopathy. Does not bruise/bleed easily.  Psychiatric/Behavioral: Negative for decreased concentration and dysphoric mood. The patient is not nervous/anxious.        Objective:   Physical Exam Constitutional:      General: She is not in acute distress.    Appearance: She is well-developed. She is obese. She is not ill-appearing.  HENT:     Head: Normocephalic and atraumatic.     Mouth/Throat:     Mouth: Mucous membranes are moist.     Pharynx: Oropharynx is clear.  Eyes:     Conjunctiva/sclera: Conjunctivae normal.     Pupils: Pupils are equal, round, and reactive to light.  Neck:     Musculoskeletal: Normal range of motion and neck supple. No  muscular tenderness.     Thyroid: No thyromegaly.     Vascular: No carotid bruit or JVD.  Cardiovascular:     Rate and Rhythm: Normal rate and regular rhythm.     Pulses: Normal pulses.     Heart sounds: Normal heart sounds. No murmur. No gallop.   Pulmonary:     Effort: Pulmonary effort is normal. No respiratory distress.     Breath sounds: Normal breath sounds. No stridor. No wheezing, rhonchi or rales.     Comments: No crackles or wheeze Chest:     Chest wall: No tenderness.  Abdominal:     General: Bowel sounds are normal. There is no distension or abdominal bruit.     Palpations: Abdomen is soft. There is no mass.     Tenderness: There is no abdominal tenderness.  Musculoskeletal:     Right lower leg: No edema.     Left lower leg: No edema.     Comments: No palpable cords or leg tenderness  Lymphadenopathy:     Cervical: No cervical adenopathy.  Skin:    General: Skin is warm and dry.     Capillary Refill: Capillary refill takes less than 2 seconds.     Findings: Erythema and rash present.     Comments: Pink colored resolving rash (healing) under nose on L side and in nasolabial fold  One papule lies just lateral to this No vesicles  No tenderness or drainage or crust   Neurological:     General: No focal deficit present.     Mental Status: She is alert.     Coordination: Coordination normal.     Gait: Gait normal.     Deep Tendon Reflexes: Reflexes are normal and symmetric.  Reflexes normal.  Psychiatric:        Mood and Affect: Mood normal.           Assessment & Plan:   Problem List Items Addressed This Visit      Musculoskeletal and Integument   Impetigo    This looks to be improving with bactroban ointment  Enc her to use on all affected areas (has a new spot to the L of current rash) If no further improvement -would consider oral antibiotics         Other   Palpitations    Worse in ams  Reassuring ekg Enc her to limit or stop caffeine  Ref to  cardiology      Relevant Orders   Ambulatory referral to Cardiology   Chest pain - Primary    In ams with palpitations and sob  Reassuring EKG Some cardiac risk factors  Ref to cardiology  If this persists or worsens inst to go to ED      Relevant Orders   EKG 12-Lead (Completed)   Ambulatory referral to Cardiology   Shortness of breath on exertion    On prolonged exertion and also some in ams with elevated HR and chest discomfort  ? Of lower 02 per her fitness watch  No PND or orthopnea  Cardiac risk factors include high cholesterol  Ref to cardiology for further eval       Relevant Orders   Ambulatory referral to Cardiology

## 2018-08-27 NOTE — Assessment & Plan Note (Signed)
This looks to be improving with bactroban ointment  Enc her to use on all affected areas (has a new spot to the L of current rash) If no further improvement -would consider oral antibiotics

## 2018-08-27 NOTE — Assessment & Plan Note (Signed)
Worse in ams  Reassuring ekg Enc her to limit or stop caffeine  Ref to cardiology

## 2018-08-27 NOTE — Assessment & Plan Note (Signed)
On prolonged exertion and also some in ams with elevated HR and chest discomfort  ? Of lower 02 per her fitness watch  No PND or orthopnea  Cardiac risk factors include high cholesterol  Ref to cardiology for further eval

## 2018-08-28 ENCOUNTER — Ambulatory Visit (INDEPENDENT_AMBULATORY_CARE_PROVIDER_SITE_OTHER): Payer: BC Managed Care – PPO | Admitting: Physician Assistant

## 2018-08-28 ENCOUNTER — Encounter: Payer: Self-pay | Admitting: Physician Assistant

## 2018-08-28 VITALS — BP 158/91 | HR 93 | Ht 67.0 in | Wt 197.0 lb

## 2018-08-28 DIAGNOSIS — G4700 Insomnia, unspecified: Secondary | ICD-10-CM

## 2018-08-28 DIAGNOSIS — F3162 Bipolar disorder, current episode mixed, moderate: Secondary | ICD-10-CM

## 2018-08-28 DIAGNOSIS — F411 Generalized anxiety disorder: Secondary | ICD-10-CM

## 2018-08-28 MED ORDER — LAMOTRIGINE 25 MG PO TABS: ORAL_TABLET | ORAL | 1 refills | Status: DC

## 2018-08-28 MED ORDER — OXCARBAZEPINE 300 MG PO TABS: ORAL_TABLET | ORAL | 1 refills | Status: DC
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
## 2018-09-04 ENCOUNTER — Ambulatory Visit: Payer: BC Managed Care – PPO | Admitting: Psychology

## 2018-09-04 DIAGNOSIS — F4323 Adjustment disorder with mixed anxiety and depressed mood: Secondary | ICD-10-CM

## 2018-09-05 ENCOUNTER — Ambulatory Visit: Payer: BC Managed Care – PPO | Admitting: Internal Medicine

## 2018-09-05 ENCOUNTER — Encounter: Payer: Self-pay | Admitting: Internal Medicine

## 2018-09-05 VITALS — BP 134/72 | HR 80 | Ht 68.5 in | Wt 200.8 lb

## 2018-09-05 DIAGNOSIS — R002 Palpitations: Secondary | ICD-10-CM

## 2018-09-05 DIAGNOSIS — Z01812 Encounter for preprocedural laboratory examination: Secondary | ICD-10-CM

## 2018-09-05 DIAGNOSIS — R072 Precordial pain: Secondary | ICD-10-CM

## 2018-09-05 DIAGNOSIS — E782 Mixed hyperlipidemia: Secondary | ICD-10-CM | POA: Diagnosis not present

## 2018-09-05 MED ORDER — METOPROLOL TARTRATE 25 MG PO TABS: 25.0000 mg | ORAL_TABLET | Freq: Two times a day (BID) | ORAL | 3 refills | Status: DC

## 2018-09-05 MED ORDER — ATORVASTATIN CALCIUM 80 MG PO TABS: 80.0000 mg | ORAL_TABLET | Freq: Every day | ORAL | 11 refills | Status: DC

## 2018-09-05 NOTE — Patient Instructions (Addendum)
Medication Instructions:  Continue: Aspirin 81 mg  Increase: Lipitor 80 mg daily Start: Metoprolol 25 mg two times a day  If you need a refill on your cardiac medications before your next appointment, please call your pharmacy.   Lab work: Your physician recommends that you return for lab work 1 week prior to procedure  If you have labs (blood work) drawn today and your tests are completely normal, you will receive your results only by: Marland Kitchen MyChart Message (if you have MyChart) OR . A paper copy in the mail If you have any lab test that is abnormal or we need to change your treatment, we will call you to review the results.  Testing/Procedures: Your physician has requested that you have cardiac CT. Cardiac computed tomography (CT) is a painless test that uses an x-ray machine to take clear, detailed pictures of your heart. For further information please visit [email or web address]. Please follow instruction sheet as given. [facility]   Follow-Up: Your physician recommends that you schedule a follow-up appointment in 2 months with Dr. Debara Pickett.   Any Other Special Instructions Will Be Listed Below (If Applicable).  Please arrive at the [facility] main entrance of [facility] at xx:xx AM (30-45 minutes prior to test start time)  [facility] [place], [place] [address] [phone]  Proceed to the [facility] Radiology Department (First Floor).  Please follow these instructions carefully (unless otherwise directed):   On the Night Before the Test: . Be sure to Drink plenty of water. . Do not consume any caffeinated/decaffeinated beverages or chocolate 12 hours prior to your test. . Do not take any antihistamines 12 hours prior to your test.  On the Day of the Test: . Drink plenty of water. Do not drink any water within one hour of the test. . Do not eat any food 4 hours prior to the test. . You may take your regular medications prior to  the test.   After the Test: . Drink plenty of water. . After receiving IV contrast, you may experience a mild flushed feeling. This is normal. . On occasion, you may experience a mild rash up to 24 hours after the test. This is not dangerous. If this occurs, you can take Benadryl 25 mg and increase your fluid intake. . If you experience trouble breathing, this can be serious. If it is severe call 911 IMMEDIATELY. If it is mild, please call our office.

## 2018-09-05 NOTE — Progress Notes (Signed)
OFFICE CONSULT NOTE  Chief Complaint:  Chest pain  Primary Care Physician: Tower, Wynelle Fanny, MD  HPI:  Michele [name] is a 53 y.o. female who is being seen today for the evaluation of chest pain at the request of Tower, Wynelle Fanny, MD.  This is a pleasant 53 year old female kindly referred for evaluation of chest pain.  Past medical history significant for anxiety, asthma, depression and recent diagnosis of bipolar disorder, hyperlipidemia, GERD, IBS and other medical problems.  Over the past several months she has had some progressive chest pressure as well as pain in her left neck rating down into the left arm.  This comes on a little more with exertion and is relieved at rest.  She and her husband have been doing more walking and on a concentrated program to try to lose weight.  She notes it is particularly difficult when she walks up an incline or stairs.  Family history significant for heart disease in her father who had A. fib heart failure and a pacemaker and early coronary disease in his 43s or 44s.  Her mom also has hyperlipidemia and she has a sister who has type 2 diabetes.  She has been on atorvastatin for some time 20 mg.  Interestingly a recent lipid profile in October 2019 showed total cholesterol 283, HDL 47, LDL 208 and triglycerides 139.  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  We did discuss diet at some length today.  Although she does eat some atherogenic foods, and general diet recently has been lower in cholesterol and leaner meats.  She says she eats a lot of venison as well as vegetables and chicken.  With regards to her chest pain.  She describes it as a pressure or squeezing sensation, associated with some shortness of breath.  In addition she has been having palpitations.  This is awakening her at night and use occurs around 3 to 3:30 AM.  This is about an hour  before her husband wakes up.  She denies any intrusive dreams.  Her husband notes that she does not snore heavily, gas or breath or have any other symptoms concerning for apnea.  PMHx:  Past Medical History:  Diagnosis Date  . Abnormal Pap smear of cervix    cryotx  . Anxiety   . Arthritis   . Asthma   . Depression   . Endometriosis   . Fatty liver   . Gastric polyp   . GERD (gastroesophageal reflux disease)   . Hemorrhoids   . Hiatal hernia   . HLD (hyperlipidemia)   . Hyperplastic colon polyp   . IBS (irritable bowel syndrome)   . Iron deficiency anemia 1/12  . Pneumonia   . Rectocele   . Rotator cuff tear, left 2009  . Syncope   . Tachycardia    s/p negative cardiac work up with echo  . Uterine fibroid [id number]    Past Surgical History:  Procedure Laterality Date  . CARPAL TUNNEL RELEASE Bilateral   . COLONOSCOPY  1999   small polyps, hemms  . ESOPHAGOGASTRODUODENOSCOPY  2/12   gastric polyp and HH  . HYSTEROSCOPY    . LAPAROSCOPY  2006   endometriosis  . ROTATOR CUFF REPAIR Left     FAMHx:  Family History  Problem Relation Age of Onset  . Hypertension Father   . Diabetes Father   . Stroke Father   .  Migraines Father   . Barrett's esophagus Father   . Anemia Father        iron def  . Heart disease Father        pacemaker, a fib, CHF  . Bipolar disorder Father   . Dementia Mother   . Allergic rhinitis Mother   . Diabetes Sister   . Hyperlipidemia Sister   . Anxiety disorder Daughter   . Bipolar disorder Daughter   . ADD / ADHD Daughter   . Neuropathy Daughter   . Syncope episode Daughter   . Diabetes Paternal Grandfather   . COPD Paternal Grandmother   . Breast cancer Paternal Grandmother   . Angioedema Neg Hx   . Asthma Neg Hx   . Atopy Neg Hx   . Eczema Neg Hx   . Immunodeficiency Neg Hx   . Urticaria Neg Hx     SOCHx:   reports that she quit smoking about 26 years ago. Her smoking use included cigarettes. She has a 1.25 pack-year smoking  history. She has never used smokeless tobacco. She reports current alcohol use. She reports that she does not use drugs.  ALLERGIES:  Allergies  Allergen Reactions  . Hydrocodone-Acetaminophen Itching  . Nsaids Nausea And Vomiting       . Povidone-Iodine Rash       . Propoxyphene N-Acetaminophen Itching         ROS: Pertinent items noted in HPI and remainder of comprehensive ROS otherwise negative.  HOME MEDS: Current Outpatient Medications on File Prior to Visit  Medication Sig Dispense Refill  . aspirin EC 81 MG tablet Take 81 mg by mouth daily.    . Biotin w/ Vitamins C & E (HAIR/SKIN/NAILS PO) Take 3 tablets by mouth daily.    . busPIRone (BUSPAR) 15 MG tablet Take 1 tablet (15 mg total) by mouth 2 (two) times daily. Take 1/2 pill twice daily for 1-2 weeks and then increase to 1 pill twice daily 60 tablet 11  . COLLAGEN PO Take 1 tablet by mouth daily.    . diphenhydrAMINE (BENADRYL) 25 MG tablet Take 75 mg by mouth at bedtime as needed for sleep.    Marland Kitchen esomeprazole (NEXIUM) 40 MG capsule TAKE 1 CAPSULE BY MOUTH EVERY DAY IN THE MORNING 30 capsule 11  . fluticasone (FLONASE) 50 MCG/ACT nasal spray Place 2 sprays into both nostrils as needed. 16 g 11  . lamoTRIgine (LAMICTAL) 25 MG tablet 1 qhs for 2 weeks then 2 qhs. 60 tablet 1  . Misc Natural Products (ADRENAL PO) Take 1 tablet by mouth at bedtime as needed (sleep).    . montelukast (SINGULAIR) 10 MG tablet TAKE 1 TABLET BY MOUTH EVERYDAY AT BEDTIME 90 tablet 3  . naproxen sodium (ALEVE) 220 MG tablet Take 440 mg by mouth daily as needed (pain).    . Oxcarbazepine (TRILEPTAL) 300 MG tablet 1 qhs for 4 days then 1 bid. 60 tablet 1  . Prenatal Vit-Fe Fumarate-FA (PRENATAL PO) Take 1 tablet by mouth daily.    . vitamin C (ASCORBIC ACID) 500 MG tablet Take 500 mg by mouth daily.    . vitamin E (VITAMIN E) 400 UNIT capsule Take 2 capsules (800 Units total) by mouth daily. 60 capsule 5  . [DISCONTINUED] norethindrone-ethinyl  estradiol-iron (MICROGESTIN FE1.5/30) 1.5-30 MG-MCG tablet Take 1 tablet by mouth daily.       No current facility-administered medications on file prior to visit.     LABS/IMAGING: No results found for this or  any previous visit (from the past 48 hour(s)). No results found.  LIPID PANEL:    Component Value Date/Time   CHOL 283 (H) 04/04/2018 0921   TRIG 139.0 04/04/2018 0921   HDL 47.60 04/04/2018 0921   CHOLHDL 6 04/04/2018 0921   VLDL 27.8 04/04/2018 0921   LDLCALC 208 (H) 04/04/2018 0921   LDLDIRECT 202.8 03/11/2012 1353    WEIGHTS: Wt Readings from Last 3 Encounters:  09/05/18 200 lb 12.8 oz (91.1 kg)  08/27/18 197 lb (89.4 kg)  06/05/18 194 lb 4 oz (88.1 kg)    VITALS: BP 134/72   Pulse 80   Ht 5' 8.5" (1.74 m)   Wt 200 lb 12.8 oz (91.1 kg)   LMP 03/06/2014   BMI 30.09 kg/m   EXAM: General appearance: alert and no distress Neck: no carotid bruit, no JVD and thyroid not enlarged, symmetric, no tenderness/mass/nodules Lungs: clear to auscultation bilaterally Heart: regular rate and rhythm, S1, S2 normal, no murmur, click, rub or gallop Abdomen: soft, non-tender; bowel sounds normal; no masses,  no organomegaly Extremities: extremities normal, atraumatic, no cyanosis or edema Pulses: 2+ and symmetric Skin: Skin color, texture, turgor normal. No rashes or lesions Neurologic: Grossly normal Psych: Pleasant  EKG: Sinus rhythm at 80- personally reviewed  ASSESSMENT: 1. Chest pain, possibly concerning for unstable angina 2. Possible familial hyperlipidemia-Dutch score 6 3. Family history of premature coronary disease  PLAN: 47.   Mrs. Hepburn is having chest pain which is worsened recently concerning for possible unstable angina.  Although some of her symptoms are typical others are not so and her EKG is very normal.  She does have a family history of premature coronary disease and very high lipids, possibly concerning for familial hyperlipidemia.  Otherwise she  has few traditional cardiovascular risk factors.  Based on this, I think that she would be a good candidate for CT coronary angiography.  This would give [place] a better idea of the extent of her coronary disease and as to whether or not she has any evidence of obstruction.  Additionally, will help [place] further assess for goal for lipid therapy which is likely LDL less than 70.  On her current dose of atorvastatin she is unlikely to reach that and I would recommend increasing it to 80 mg daily.  We will likely repeat a lipid profile in about 3 months.  Plan to follow-up after her CT with further recommendations and possibly proceed to left heart catheterization.  She is taking low-dose daily aspirin.  Will start metoprolol tartrate 25 mg twice daily today for coronary disease, palpitations, and to lower heart rate in preparation for CT scan.  Thanks again for the kind referral.  Pixie Casino, MD, FACC, [place] Director of the [facility] Diplomate of the American Board of Clinical Lipidology Attending Cardiologist  Direct Dial: [phone]  Fax: [phone]  Website:  [email or web address] 09/05/2018, 1:31 PM

## 2018-09-12 ENCOUNTER — Encounter: Payer: Self-pay | Admitting: Family Medicine

## 2018-09-12 DIAGNOSIS — N9089 Other specified noninflammatory disorders of vulva and perineum: Secondary | ICD-10-CM

## 2018-09-16 DIAGNOSIS — N9089 Other specified noninflammatory disorders of vulva and perineum: Secondary | ICD-10-CM | POA: Insufficient documentation

## 2018-09-16 NOTE — Telephone Encounter (Signed)
Referral to gyn Please call her  Thanks  She has seen Dr Hulan Fray in the past

## 2018-09-18 ENCOUNTER — Ambulatory Visit (INDEPENDENT_AMBULATORY_CARE_PROVIDER_SITE_OTHER): Payer: BC Managed Care – PPO | Admitting: Psychology

## 2018-09-18 DIAGNOSIS — F4323 Adjustment disorder with mixed anxiety and depressed mood: Secondary | ICD-10-CM

## 2018-09-19 ENCOUNTER — Other Ambulatory Visit: Payer: Self-pay | Admitting: Physician Assistant

## 2018-09-19 ENCOUNTER — Encounter: Payer: Self-pay | Admitting: Family Medicine

## 2018-09-19 MED ORDER — ALBUTEROL SULFATE HFA 108 (90 BASE) MCG/ACT IN AERS: 2.0000 | INHALATION_SPRAY | RESPIRATORY_TRACT | 3 refills | Status: DC | PRN

## 2018-09-19 NOTE — Telephone Encounter (Signed)
No inhaler on med list please advise

## 2018-09-23 ENCOUNTER — Encounter: Payer: Self-pay | Admitting: Family Medicine

## 2018-09-23 MED ORDER — TRIAMCINOLONE ACETONIDE 0.5 % EX OINT: 1.0000 "application " | TOPICAL_OINTMENT | Freq: Two times a day (BID) | CUTANEOUS | 0 refills | Status: DC

## 2018-09-26 ENCOUNTER — Encounter: Payer: Self-pay | Admitting: Family Medicine

## 2018-09-26 MED ORDER — PREDNISONE 10 MG PO TABS: ORAL_TABLET | ORAL | 0 refills | Status: DC

## 2018-10-01 ENCOUNTER — Encounter: Payer: Self-pay | Admitting: Obstetrics & Gynecology

## 2018-10-01 ENCOUNTER — Other Ambulatory Visit: Payer: Self-pay | Admitting: Family Medicine

## 2018-10-02 ENCOUNTER — Ambulatory Visit: Payer: BC Managed Care – PPO | Admitting: Psychology

## 2018-10-03 ENCOUNTER — Ambulatory Visit (INDEPENDENT_AMBULATORY_CARE_PROVIDER_SITE_OTHER): Payer: BC Managed Care – PPO | Admitting: Psychology

## 2018-10-03 DIAGNOSIS — F4323 Adjustment disorder with mixed anxiety and depressed mood: Secondary | ICD-10-CM | POA: Diagnosis not present

## 2018-10-07 ENCOUNTER — Ambulatory Visit: Payer: BC Managed Care – PPO | Admitting: Physician Assistant

## 2018-10-07 ENCOUNTER — Telehealth: Payer: Self-pay | Admitting: Physician Assistant

## 2018-10-07 ENCOUNTER — Other Ambulatory Visit: Payer: Self-pay

## 2018-10-07 NOTE — Telephone Encounter (Signed)
Tried to call pt 3 times between 11:05 and 11:20.  Unable to connect w/ her cell phone and no way to leave msg.

## 2018-10-08 ENCOUNTER — Encounter: Payer: Self-pay | Admitting: Physician Assistant

## 2018-10-08 ENCOUNTER — Other Ambulatory Visit: Payer: Self-pay

## 2018-10-08 ENCOUNTER — Ambulatory Visit (INDEPENDENT_AMBULATORY_CARE_PROVIDER_SITE_OTHER): Payer: BC Managed Care – PPO | Admitting: Physician Assistant

## 2018-10-08 DIAGNOSIS — F411 Generalized anxiety disorder: Secondary | ICD-10-CM | POA: Diagnosis not present

## 2018-10-08 DIAGNOSIS — F3162 Bipolar disorder, current episode mixed, moderate: Secondary | ICD-10-CM

## 2018-10-08 DIAGNOSIS — G4700 Insomnia, unspecified: Secondary | ICD-10-CM

## 2018-10-08 MED ORDER — OXCARBAZEPINE 300 MG PO TABS: 300.0000 mg | ORAL_TABLET | Freq: Three times a day (TID) | ORAL | 1 refills | Status: DC

## 2018-10-08 MED ORDER — LAMOTRIGINE 100 MG PO TABS: 100.0000 mg | ORAL_TABLET | Freq: Every day | ORAL | 1 refills | Status: DC

## 2018-10-08 MED ORDER — LAMOTRIGINE 25 MG PO TABS: ORAL_TABLET | ORAL | 0 refills | Status: DC

## 2018-10-08 NOTE — Progress Notes (Signed)
[facility]  Patient ID: Michele [name],  MRN: [id number]  PCP: Abner Greenspan, MD  Date of Evaluation: 10/08/2018 Time spent:15 minutes  Chief Complaint:  Chief Complaint    Follow-up     Virtual Visit via Telephone Note  I connected with [name] on 10/08/18 at  1:30 PM EDT by telephone and verified that I am speaking with the correct person using two identifiers.   I discussed the limitations, risks, security and privacy concerns of performing an evaluation and management service by telephone and the availability of in person appointments. I also discussed with the patient that there may be a patient responsible charge related to this service. The patient expressed understanding and agreed to proceed.    HISTORY/CURRENT STATUS: HPI For routine med check.  "I think the medicines are definitely helping.  I am glad I am not as angry and easy to go off on people like I was before starting the medicine."  At the last visit on February 26 we added Trileptal and Lamictal.  She states she does not cry as easily although it does still happen at times.  There is no known trigger.  She will still get angry and irritable at times but again, much less often and not as "ballistic" as she was before starting the medication.  Reports feeling like we are on the right track although it might be good if we tweak things some for her to feel even better."  Because of the coronavirus pandemic, she is isolating but not because of depression.  She is able to enjoy things a little more and has more energy and motivation than she did prior to starting the Lamictal and Trileptal.  She denies suicidal thoughts.  Denies increased energy with decreased need for sleep.  No impulsivity, risky behavior, increased spending, or grandiosity.  Denies muscle or joint pain, stiffness, or dystonia.  Denies dizziness, syncope, seizures, numbness, tingling, tremor, tics, unsteady gait, slurred speech,  confusion.   Individual Medical History/ Review of Systems: Changes? :No    Past medications for mental health diagnoses include: Buspar, Prozac, Paxil  Allergies: Hydrocodone-acetaminophen; Nsaids; Povidone-iodine; and Propoxyphene n-acetaminophen  Current Medications:  Current Outpatient Medications:  .  albuterol (PROVENTIL HFA;VENTOLIN HFA) 108 (90 Base) MCG/ACT inhaler, Inhale 2 puffs into the lungs every 4 (four) hours as needed for wheezing., Disp: 1 Inhaler, Rfl: 3 .  aspirin EC 81 MG tablet, Take 81 mg by mouth daily., Disp: , Rfl:  .  atorvastatin (LIPITOR) 80 MG tablet, Take 1 tablet (80 mg total) by mouth daily., Disp: 30 tablet, Rfl: 11 .  Biotin w/ Vitamins C & E (HAIR/SKIN/NAILS PO), Take 3 tablets by mouth daily., Disp: , Rfl:  .  busPIRone (BUSPAR) 15 MG tablet, Take 1 tablet (15 mg total) by mouth 2 (two) times daily. Take 1/2 pill twice daily for 1-2 weeks and then increase to 1 pill twice daily (Patient taking differently: Take 15 mg by mouth 2 (two) times daily. ), Disp: 60 tablet, Rfl: 11 .  COLLAGEN PO, Take 1 tablet by mouth daily., Disp: , Rfl:  .  diphenhydrAMINE (BENADRYL) 25 MG tablet, Take 75 mg by mouth at bedtime as needed for sleep., Disp: , Rfl:  .  esomeprazole (NEXIUM) 40 MG capsule, TAKE 1 CAPSULE BY MOUTH EVERY DAY IN THE MORNING, Disp: 90 capsule, Rfl: 1 .  fluticasone (FLONASE) 50 MCG/ACT nasal spray, Place 2 sprays into both nostrils as needed., Disp: 16 g, Rfl: 11 .  lamoTRIgine (LAMICTAL) 25 MG tablet, 3 TABS AT BEDTIME for 2 weeks, then start 100mg  RX., Disp: 45 tablet, Rfl: 0 .  metoprolol tartrate (LOPRESSOR) 25 MG tablet, Take 1 tablet (25 mg total) by mouth 2 (two) times daily., Disp: 180 tablet, Rfl: 3 .  Misc Natural Products (ADRENAL PO), Take 1 tablet by mouth at bedtime as needed (sleep)., Disp: , Rfl:  .  montelukast (SINGULAIR) 10 MG tablet, TAKE 1 TABLET BY MOUTH EVERYDAY AT BEDTIME, Disp: 90 tablet, Rfl: 3 .  naproxen sodium (ALEVE) 220  MG tablet, Take 440 mg by mouth daily as needed (pain)., Disp: , Rfl:  .  Oxcarbazepine (TRILEPTAL) 300 MG tablet, Take 1 tablet (300 mg total) by mouth 3 (three) times daily., Disp: 90 tablet, Rfl: 1 .  Prenatal Vit-Fe Fumarate-FA (PRENATAL PO), Take 1 tablet by mouth daily., Disp: , Rfl:  .  triamcinolone ointment (KENALOG) 0.5 %, Apply 1 application topically 2 (two) times daily. To affected area, Disp: 30 g, Rfl: 0 .  vitamin C (ASCORBIC ACID) 500 MG tablet, Take 500 mg by mouth daily., Disp: , Rfl:  .  vitamin E (VITAMIN E) 400 UNIT capsule, Take 2 capsules (800 Units total) by mouth daily., Disp: 60 capsule, Rfl: 5 .  [START ON 10/22/2018] lamoTRIgine (LAMICTAL) 100 MG tablet, Take 1 tablet (100 mg total) by mouth daily., Disp: 30 tablet, Rfl: 1 .  predniSONE (DELTASONE) 10 MG tablet, Take 3 pills once daily by mouth for 3 days, then 2 pills once daily for 3 days, then 1 pill once daily for 3 days and then stop (Patient not taking: Reported on 10/08/2018), Disp: 18 tablet, Rfl: 0 Medication Side Effects: none  Family Medical/ Social History: Changes? No  MENTAL HEALTH EXAM:  Last menstrual period 03/06/2014.There is no height or weight on file to calculate BMI.  General Appearance: Phone visit unable to assess.  Eye Contact:  Unable to assess  Speech:  Clear and Coherent  Volume:  Normal  Mood:  Euthymic  Affect:  Unable to assess  Thought Process:  Goal Directed  Orientation:  Full (Time, Place, and Person)  Thought Content: Logical   Suicidal Thoughts:  No  Homicidal Thoughts:  No  Memory:  WNL  Judgement:  Good  Insight:  Good  Psychomotor Activity:  Unable to assess  Concentration:  Concentration: Good  Recall:  Good  Fund of Knowledge: Good  Language: Good  Assets:  Desire for Improvement  ADL's:  Intact  Cognition: WNL  Prognosis:  Good    DIAGNOSES:    ICD-10-CM   1. Bipolar 1 disorder, mixed, moderate (HCC) F31.62   2. Insomnia, unspecified type G47.00   3.  Generalized anxiety disorder F41.1     Receiving Psychotherapy: No    RECOMMENDATIONS:  Increase Lamictal to 75 mg p.o. daily for 2 weeks.  Thereafter, Increase Lamictal to 100 mg p.o. daily. Increase Trileptal 300 mg 3 times daily (she can take 1 every morning and 2 nightly, or 2 every morning and 1 nightly if she prefers instead of 1 3 times daily.) Continue BuSpar 15 mg 1 twice daily. Return in 6 weeks.   Donnal Moat, PA-C   This record has been created using [employer].  Chart creation errors have been sought, but may not always have been located and corrected. Such creation errors do not reflect on the standard of medical care.

## 2018-10-09 ENCOUNTER — Encounter: Payer: Self-pay | Admitting: Family Medicine

## 2018-10-10 MED ORDER — PREDNISONE 10 MG PO TABS: ORAL_TABLET | ORAL | 0 refills | Status: DC

## 2018-10-11 ENCOUNTER — Other Ambulatory Visit: Payer: Self-pay | Admitting: Physician Assistant

## 2018-10-16 ENCOUNTER — Ambulatory Visit (INDEPENDENT_AMBULATORY_CARE_PROVIDER_SITE_OTHER): Payer: BC Managed Care – PPO | Admitting: Psychology

## 2018-10-16 DIAGNOSIS — F4323 Adjustment disorder with mixed anxiety and depressed mood: Secondary | ICD-10-CM | POA: Diagnosis not present

## 2018-10-30 ENCOUNTER — Ambulatory Visit (INDEPENDENT_AMBULATORY_CARE_PROVIDER_SITE_OTHER): Payer: BC Managed Care – PPO | Admitting: Psychology

## 2018-10-30 DIAGNOSIS — F4323 Adjustment disorder with mixed anxiety and depressed mood: Secondary | ICD-10-CM | POA: Diagnosis not present

## 2018-11-01 ENCOUNTER — Other Ambulatory Visit: Payer: Self-pay | Admitting: Physician Assistant

## 2018-11-03 ENCOUNTER — Telehealth: Payer: BC Managed Care – PPO | Admitting: Family

## 2018-11-03 DIAGNOSIS — R51 Headache: Secondary | ICD-10-CM

## 2018-11-03 DIAGNOSIS — R519 Headache, unspecified: Secondary | ICD-10-CM

## 2018-11-03 DIAGNOSIS — R112 Nausea with vomiting, unspecified: Secondary | ICD-10-CM

## 2018-11-03 DIAGNOSIS — R42 Dizziness and giddiness: Secondary | ICD-10-CM

## 2018-11-03 NOTE — Progress Notes (Signed)
Based on what you shared with me, I feel your condition warrants further evaluation and I recommend that you be seen for a face to face office visit.  Given your symptoms of dizziness, headache, sweating, and vomiting I think it would be best if you were evaluated face to face to rule out serious conditions.    NOTE: If you entered your credit card information for this eVisit, you will not be charged. You may see a "hold" on your card for the $35 but that hold will drop off and you will not have a charge processed.  If you are having a true medical emergency please call 911.  If you need an urgent face to face visit, [facility] has four urgent care centers for your convenience.    PLEASE NOTE: THE INSTACARE LOCATIONS AND URGENT CARE CLINICS DO NOT HAVE THE TESTING FOR CORONAVIRUS COVID19 AVAILABLE.  IF YOU FEEL YOU NEED THIS TEST YOU MUST GO TO A TRIAGE LOCATION AT [facility]   [email or web address] to reserve your spot online an avoid wait times  [facility] [address], [address] [place], [place] [address] Modified hours of operation: Monday-Friday, 12 PM to 6 PM  Saturday & Sunday 10 AM to 4 PM *Across the street from [place] (New Address!) [address] [place], [place] [address] *Just off [address], across the road from [place] hours of operation: Monday-Friday, 12 PM to 6 PM  Closed Saturday & Sunday  [facility]'s modified hours of operation will be in effect from May 1 until May 31   The following sites will take your insurance:  . [facility] [place] a Provider at this Location  [address] [place], [place] [address] . 10 am to 8 pm Monday-Friday . 12 pm to 8 pm Saturday-Sunday   . [facility] at [place] a Provider at this  Location  [address] [place], [place], [place] [address] . 8 am to 8 pm Monday-Friday . 9 am to 6 pm Saturday . 11 am to 6 pm Sunday   . [facility] at [place] [facility] Driving Directions  [address] [place], [place] [address] . 8 am to 8 pm Monday-Friday . 8 am to 4 pm Saturday-Sunday   Your e-visit answers were reviewed by a board certified advanced clinical practitioner to complete your personal care plan.  Thank you for using e-Visits.

## 2018-11-04 ENCOUNTER — Ambulatory Visit: Payer: BC Managed Care – PPO | Admitting: Family Medicine

## 2018-11-04 ENCOUNTER — Other Ambulatory Visit: Payer: Self-pay

## 2018-11-04 ENCOUNTER — Telehealth: Payer: Self-pay | Admitting: Internal Medicine

## 2018-11-04 ENCOUNTER — Encounter: Payer: Self-pay | Admitting: Family Medicine

## 2018-11-04 VITALS — BP 124/76 | HR 61 | Temp 98.0°F | Ht 68.5 in | Wt 198.1 lb

## 2018-11-04 DIAGNOSIS — H8113 Benign paroxysmal vertigo, bilateral: Secondary | ICD-10-CM | POA: Diagnosis not present

## 2018-11-04 DIAGNOSIS — H8110 Benign paroxysmal vertigo, unspecified ear: Secondary | ICD-10-CM | POA: Insufficient documentation

## 2018-11-04 MED ORDER — MECLIZINE HCL 25 MG PO TABS: 25.0000 mg | ORAL_TABLET | Freq: Three times a day (TID) | ORAL | 0 refills | Status: DC | PRN

## 2018-11-04 NOTE — Patient Instructions (Signed)
Try the meclizine for vertigo and nausea  It may sedate  Do not change position quickly  Take a look at the handouts  Drink fluids   Watch for severe headache or signs of stroke - facial droop or trouble speaking or weakness- call 911 if this occurs   Update if not starting to improve in a week or if worsening

## 2018-11-04 NOTE — Telephone Encounter (Signed)
Video/ consent/ smartphone/ my chart/ pre reg completed

## 2018-11-04 NOTE — Assessment & Plan Note (Signed)
Much improved  Reassuring exam  Improved from dramamine-but would like to try meclizine instead (? More helpful with nausea) Update if not starting to improve in a week or if worsening  Disc imp of changing positions slowly  Disc s/s of cva to watch for (her husband is first responder and will watch)  Update if not starting to improve in a week or if worsening   Handouts on vertigo and epley maneuver given

## 2018-11-04 NOTE — Progress Notes (Signed)
Subjective:    Patient ID: Michele [name], female    DOB: [birth year], 53 y.o.   MRN: [id number]  HPI Here for symptoms of vertigo  Wt Readings from Last 3 Encounters:  11/04/18 198 lb 2 oz (89.9 kg)  09/05/18 200 lb 12.8 oz (91.1 kg)  08/27/18 197 lb (89.4 kg)   29.69 kg/m   When she woke up yesterday- looked at the clock and could not focus well  Got up and started her routine Hit her once she went downstairs - very dizzy Stayed very still on a chair  Did an e visit-told to do face to face   Room was spinning  Husband said her eyes jerked  She had to keep eyes closed and stay in a dark room  It came in waves -worse with change in position   No ear pain or pressure  During allergy season- occ sniffles   No fever  No covid contacts   She did have a headache also - took aleve for it  This is better    She had some equate motion sickness medication (dramamine)- it may have caused a bit of sedation  Helped a lot  Vertigo is much improved today  Still a little nauseated (no vomiting)   Had some diarrhea a day prior  Wondered if dehydration lead to her dizziness    Review of Systems  Constitutional: Negative for activity change, appetite change, chills, fatigue, fever and unexpected weight change.  HENT: Positive for postnasal drip. Negative for congestion, ear discharge, ear pain, rhinorrhea, sinus pressure and sore throat.   Eyes: Negative for pain, redness and visual disturbance.  Respiratory: Negative for cough, shortness of breath and wheezing.   Cardiovascular: Negative for chest pain and palpitations.  Gastrointestinal: Negative for abdominal pain, blood in stool, constipation and diarrhea.  Endocrine: Negative for polydipsia and polyuria.  Genitourinary: Negative for dysuria, frequency and urgency.  Musculoskeletal: Negative for arthralgias, back pain and myalgias.  Skin: Negative for pallor and rash.  Allergic/Immunologic: Negative for environmental  allergies.  Neurological: Positive for dizziness and headaches. Negative for tremors, seizures, syncope, facial asymmetry, speech difficulty, weakness, light-headedness and numbness.  Hematological: Negative for adenopathy. Does not bruise/bleed easily.  Psychiatric/Behavioral: Negative for decreased concentration and dysphoric mood. The patient is not nervous/anxious.    Patient Active Problem List   Diagnosis Date Noted  . BPV (benign positional vertigo) 11/04/2018  . 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 Palpitations 08/27/2018  . Precordial chest pain 08/27/2018  . Shortness of breath on exertion 08/27/2018  . Impetigo 08/27/2018  . Screen for STD (sexually transmitted disease) 04/04/2018  . Allergic rhinitis 01/10/2017  . RUQ abdominal pain 04/04/2016  . Adjustment reaction with anxious mood 08/18/2015  . Arm paresthesia, right 07/14/2015  . Diarrhea 10/13/2014  . Rectal pressure 10/01/2013  . Rectocele 10/01/2013  . Skin tag 04/23/2013  . Mixed incontinence 04/23/2013  . Encounter for routine gynecological examination 04/01/2012  . Prediabetes 04/01/2012  . Routine general medical examination at a health care facility 03/11/2012  . GERD 08/26/2010  . History of anemia 07/19/2010  . FREQUENCY, URINARY 07/06/2010  . MICROSCOPIC HEMATURIA 06/09/2010  . History of colonic polyps 11/15/2007  . IBS 10/08/2007  . ENDOMETRIOSIS 10/08/2007  . Mixed hyperlipidemia 10/30/2006  . Anxiety and depression 10/30/2006  . HEMORRHOIDS, INTERNAL 10/30/2006  . Asthma 10/30/2006  . MIGRAINES, HX OF 10/30/2006   Past Medical History:  Diagnosis Date  . Abnormal Pap smear of cervix  cryotx  . Anxiety   . Arthritis   . Asthma   . Depression   . Endometriosis   . Fatty liver   . Gastric polyp   . GERD (gastroesophageal reflux disease)   . Hemorrhoids   . Hiatal hernia   . HLD (hyperlipidemia)   . Hyperplastic colon polyp   . IBS (irritable bowel syndrome)   . Iron  deficiency anemia 1/12  . Pneumonia   . Rectocele   . Rotator cuff tear, left 2009  . Syncope   . Tachycardia    s/p negative cardiac work up with echo  . Uterine fibroid [id number]   Past Surgical History:  Procedure Laterality Date  . CARPAL TUNNEL RELEASE Bilateral   . COLONOSCOPY  1999   small polyps, hemms  . ESOPHAGOGASTRODUODENOSCOPY  2/12   gastric polyp and HH  . HYSTEROSCOPY    . LAPAROSCOPY  2006   endometriosis  . ROTATOR CUFF REPAIR Left    Social History   Tobacco Use  . Smoking status: Former Smoker    Packs/day: 0.25    Years: 5.00    Pack years: 1.25    Types: Cigarettes    Last attempt to quit: 07/03/1992    Years since quitting: 26.3  . Smokeless tobacco: Never Used  Substance Use Topics  . Alcohol use: Yes    Alcohol/week: 0.0 standard drinks    Comment: Rarely, once every few months  . Drug use: No   Family History  Problem Relation Age of Onset  . Hypertension Father   . Diabetes Father   . Stroke Father   . Migraines Father   . Barrett's esophagus Father   . Anemia Father        iron def  . Heart disease Father        pacemaker, a fib, CHF  . Bipolar disorder Father   . Dementia Mother   . Allergic rhinitis Mother   . Diabetes Sister   . Hyperlipidemia Sister   . Anxiety disorder Daughter   . Bipolar disorder Daughter   . ADD / ADHD Daughter   . Neuropathy Daughter   . Syncope episode Daughter   . Diabetes Paternal Grandfather   . COPD Paternal Grandmother   . Breast cancer Paternal Grandmother   . Angioedema Neg Hx   . Asthma Neg Hx   . Atopy Neg Hx   . Eczema Neg Hx   . Immunodeficiency Neg Hx   . Urticaria Neg Hx    Allergies  Allergen Reactions  . Hydrocodone-Acetaminophen Itching  . Nsaids Nausea And Vomiting       . Povidone-Iodine Rash       . Propoxyphene N-Acetaminophen Itching        Current Outpatient Medications on File Prior to Visit  Medication Sig Dispense Refill  . albuterol (PROVENTIL HFA;VENTOLIN HFA)  108 (90 Base) MCG/ACT inhaler Inhale 2 puffs into the lungs every 4 (four) hours as needed for wheezing. 1 Inhaler 3  . aspirin EC 81 MG tablet Take 81 mg by mouth daily.    Marland Kitchen atorvastatin (LIPITOR) 80 MG tablet Take 1 tablet (80 mg total) by mouth daily. 30 tablet 11  . Biotin w/ Vitamins C & E (HAIR/SKIN/NAILS PO) Take 3 tablets by mouth daily.    . busPIRone (BUSPAR) 15 MG tablet Take 1 tablet (15 mg total) by mouth 2 (two) times daily. Take 1/2 pill twice daily for 1-2 weeks and then increase to 1 pill twice daily (  Patient taking differently: Take 15 mg by mouth 2 (two) times daily. ) 60 tablet 11  . COLLAGEN PO Take 1 tablet by mouth daily.    . diphenhydrAMINE (BENADRYL) 25 MG tablet Take 75 mg by mouth at bedtime as needed for sleep.    Marland Kitchen esomeprazole (NEXIUM) 40 MG capsule TAKE 1 CAPSULE BY MOUTH EVERY DAY IN THE MORNING 90 capsule 1  . fluticasone (FLONASE) 50 MCG/ACT nasal spray Place 2 sprays into both nostrils as needed. 16 g 11  . lamoTRIgine (LAMICTAL) 100 MG tablet TAKE 1 TABLET BY MOUTH EVERY DAY 30 tablet 1  . lamoTRIgine (LAMICTAL) 25 MG tablet 3 TABS AT BEDTIME for 2 weeks, then start 100mg  RX. 45 tablet 0  . metoprolol tartrate (LOPRESSOR) 25 MG tablet Take 1 tablet (25 mg total) by mouth 2 (two) times daily. 180 tablet 3  . Misc Natural Products (ADRENAL PO) Take 1 tablet by mouth at bedtime as needed (sleep).    . montelukast (SINGULAIR) 10 MG tablet TAKE 1 TABLET BY MOUTH EVERYDAY AT BEDTIME 90 tablet 3  . naproxen sodium (ALEVE) 220 MG tablet Take 440 mg by mouth daily as needed (pain).    . Oxcarbazepine (TRILEPTAL) 300 MG tablet TAKE 1 TABLET (300 MG TOTAL) BY MOUTH 3 (THREE) TIMES DAILY. 90 tablet 1  . Prenatal Vit-Fe Fumarate-FA (PRENATAL PO) Take 1 tablet by mouth daily.    . vitamin C (ASCORBIC ACID) 500 MG tablet Take 500 mg by mouth daily.    . vitamin E (VITAMIN E) 400 UNIT capsule Take 2 capsules (800 Units total) by mouth daily. 60 capsule 5  . [DISCONTINUED]  norethindrone-ethinyl estradiol-iron (MICROGESTIN FE1.5/30) 1.5-30 MG-MCG tablet Take 1 tablet by mouth daily.       No current facility-administered medications on file prior to visit.        Objective:   Physical Exam Constitutional:      General: She is not in acute distress.    Appearance: Normal appearance. She is well-developed. She is obese. She is not ill-appearing or diaphoretic.  HENT:     Head: Normocephalic and atraumatic.     Comments: No sinus or temporal tenderness    Right Ear: Tympanic membrane, ear canal and external ear normal.     Left Ear: Tympanic membrane, ear canal and external ear normal.     Nose: Nose normal.     Mouth/Throat:     Mouth: Mucous membranes are moist.     Pharynx: Oropharynx is clear. No oropharyngeal exudate.  Eyes:     General: No visual field deficit or scleral icterus.       Right eye: No discharge.        Left eye: No discharge.     Conjunctiva/sclera: Conjunctivae normal.     Pupils: Pupils are equal, round, and reactive to light.     Comments: No nystagmus  Neck:     Musculoskeletal: Full passive range of motion without pain, normal range of motion and neck supple.     Thyroid: No thyromegaly.     Vascular: No carotid bruit or JVD.     Trachea: No tracheal deviation.  Cardiovascular:     Rate and Rhythm: Normal rate and regular rhythm.     Heart sounds: Normal heart sounds. No murmur.  Pulmonary:     Effort: Pulmonary effort is normal. No respiratory distress.     Breath sounds: Normal breath sounds. No wheezing or rales.  Abdominal:     General:  Bowel sounds are normal. There is no distension.     Palpations: Abdomen is soft. There is no mass.     Tenderness: There is no abdominal tenderness.  Musculoskeletal:        General: No tenderness.  Lymphadenopathy:     Cervical: No cervical adenopathy.  Skin:    General: Skin is warm and dry.     Coloration: Skin is not pale.     Findings: No erythema or rash.  Neurological:      Mental Status: She is alert and oriented to person, place, and time.     Cranial Nerves: No cranial nerve deficit, dysarthria or facial asymmetry.     Sensory: No sensory deficit.     Motor: No weakness, tremor, atrophy, abnormal muscle tone or pronator drift.     Coordination: Romberg sign negative. Coordination normal. Finger-Nose-Finger Test normal.     Gait: Gait normal.     Deep Tendon Reflexes: Reflexes are normal and symmetric. Reflexes normal.     Comments: No focal cerebellar signs   Psychiatric:        Mood and Affect: Mood normal.        Behavior: Behavior normal.        Thought Content: Thought content normal.           Assessment & Plan:   Problem List Items Addressed This Visit      Nervous and Auditory   BPV (benign positional vertigo) - Primary    Much improved  Reassuring exam  Improved from dramamine-but would like to try meclizine instead (? More helpful with nausea) Update if not starting to improve in a week or if worsening  Disc imp of changing positions slowly  Disc s/s of cva to watch for (her husband is first responder and will watch)  Update if not starting to improve in a week or if worsening   Handouts on vertigo and epley maneuver given

## 2018-11-05 ENCOUNTER — Telehealth (INDEPENDENT_AMBULATORY_CARE_PROVIDER_SITE_OTHER): Payer: BC Managed Care – PPO | Admitting: Internal Medicine

## 2018-11-05 ENCOUNTER — Encounter: Payer: Self-pay | Admitting: Internal Medicine

## 2018-11-05 VITALS — BP 124/76 | HR 61 | Temp 98.0°F | Ht 68.5 in | Wt 198.2 lb

## 2018-11-05 DIAGNOSIS — R002 Palpitations: Secondary | ICD-10-CM | POA: Diagnosis not present

## 2018-11-05 DIAGNOSIS — E782 Mixed hyperlipidemia: Secondary | ICD-10-CM

## 2018-11-05 DIAGNOSIS — R072 Precordial pain: Secondary | ICD-10-CM | POA: Diagnosis not present

## 2018-11-05 DIAGNOSIS — Z7189 Other specified counseling: Secondary | ICD-10-CM | POA: Diagnosis not present

## 2018-11-05 NOTE — Patient Instructions (Addendum)
Medication Instructions:  Continue current medications If you need a refill on your cardiac medications before your next appointment, please call your pharmacy.   Lab work: FASTING lab work to check cholesterol This can be done in Dr. Lysbeth Penner office - no appointment is needed If you have labs (blood work) drawn today and your tests are completely normal, you will receive your results only by: Marland Kitchen MyChart Message (if you have MyChart) OR . A paper copy in the mail If you have any lab test that is abnormal or we need to change your treatment, we will call you to review the results.  Testing/Procedures: You are due for a CT angiogram. A scheduler will contact you to arrange this appointment. Please follow instructions below and those provided at March 5 visit.   Follow-Up: At [facility], you and your health needs are our priority.  As part of our continuing mission to provide you with exceptional heart care, we have created designated Provider Care Teams.  These Care Teams include your primary Cardiologist (physician) and Advanced Practice Providers (APPs -  Physician Assistants and Nurse Practitioners) who all work together to provide you with the care you need, when you need it. The providers on Dr. Lysbeth Penner care team are Almyra Deforest, [place], [place]  You will be scheduled to see Dr. Debara Pickett or one of his care team members after completion of your CT test.   Coronary CT instructions  Please follow these instructions carefully (unless otherwise directed):   On the Night Before the Test:  Be sure to Drink plenty of water.  Do not consume any caffeinated/decaffeinated beverages or chocolate 12 hours prior to your test.  Do not take any antihistamines 12 hours prior to your test.  On the Day of the Test:  Drink plenty of water. Do not drink any water within one hour of the test.  Do not eat any food 4 hours prior to the test.  You may take your regular medications prior to the  test.  After the Test:  Drink plenty of water.  After receiving IV contrast, you may experience a mild flushed feeling. This is normal.  On occasion, you may experience a mild rash up to 24 hours after the test. This is not dangerous. If this occurs, you can take Benadryl 25 mg and increase your fluid intake.  If you experience trouble breathing, this can be serious. If it is severe call 911 IMMEDIATELY. If it is mild, please call our office.

## 2018-11-05 NOTE — Progress Notes (Signed)
Virtual Visit via Video Note   This visit type was conducted due to national recommendations for restrictions regarding the COVID-19 Pandemic (e.g. social distancing) in an effort to limit this patient's exposure and mitigate transmission in our community.  Due to her co-morbid illnesses, this patient is at least at moderate risk for complications without adequate follow up.  This format is felt to be most appropriate for this patient at this time.  All issues noted in this document were discussed and addressed.  A limited physical exam was performed with this format.  Please refer to the patient's chart for her consent to telehealth for [facility].   Evaluation Performed:  Doximity video visit  Date:  11/05/2018   ID:  Michele [name], DOB 1966-02-19, MRN [id number]  Patient Location:  [address] [place] [address]  Provider location:   [address], [place], [place] [address]  PCP:  Abner Greenspan, MD  Cardiologist:  No primary care provider on file. Electrophysiologist:  None   Chief Complaint:  Chest pain  History of Present Illness:    Michele [name] is a 53 y.o. female who presents via audio/video conferencing for a telehealth visit today.  Michele [name] was seen today via video visit for follow-up of chest pain.  She was having symptoms when I saw her in March 2020 concerning for unstable angina.  In addition it was felt that she had dyslipidemia with goal LDL less than 70.  I recommended starting metoprolol tartrate 25 mg twice daily for coronary disease and palpitations as well as to prepare her for a CT coronary angiogram which was also ordered.  Unfortunately the study was not performed due to the coronavirus and has been delayed.  Now at follow-up 2 months later, she reports that her chest pain symptoms have resolved.  Overall she feels fairly well.  Despite this, I feel that she still is at risk for coronary disease and would like to know whether or not  there is underlying coronary heart disease.  The patient does not have symptoms concerning for COVID-19 infection (fever, chills, cough, or new SHORTNESS OF BREATH).    Prior CV studies:   The following studies were reviewed today:  Lab work  PMHx:  Past Medical History:  Diagnosis Date  . Abnormal Pap smear of cervix    cryotx  . Anxiety   . Arthritis   . Asthma   . Depression   . Endometriosis   . Fatty liver   . Gastric polyp   . GERD (gastroesophageal reflux disease)   . Hemorrhoids   . Hiatal hernia   . HLD (hyperlipidemia)   . Hyperplastic colon polyp   . IBS (irritable bowel syndrome)   . Iron deficiency anemia 1/12  . Pneumonia   . Rectocele   . Rotator cuff tear, left 2009  . Syncope   . Tachycardia    s/p negative cardiac work up with echo  . Uterine fibroid [id number]    Past Surgical History:  Procedure Laterality Date  . CARPAL TUNNEL RELEASE Bilateral   . COLONOSCOPY  1999   small polyps, hemms  . ESOPHAGOGASTRODUODENOSCOPY  2/12   gastric polyp and HH  . HYSTEROSCOPY    . LAPAROSCOPY  2006   endometriosis  . ROTATOR CUFF REPAIR Left     FAMHx:  Family History  Problem Relation Age of Onset  . Hypertension Father   . Diabetes Father   . Stroke Father   .  Migraines Father   . Barrett's esophagus Father   . Anemia Father        iron def  . Heart disease Father        pacemaker, a fib, CHF  . Bipolar disorder Father   . Dementia Mother   . Allergic rhinitis Mother   . Diabetes Sister   . Hyperlipidemia Sister   . Anxiety disorder Daughter   . Bipolar disorder Daughter   . ADD / ADHD Daughter   . Neuropathy Daughter   . Syncope episode Daughter   . Diabetes Paternal Grandfather   . COPD Paternal Grandmother   . Breast cancer Paternal Grandmother   . Angioedema Neg Hx   . Asthma Neg Hx   . Atopy Neg Hx   . Eczema Neg Hx   . Immunodeficiency Neg Hx   . Urticaria Neg Hx     SOCHx:   reports that she quit smoking about 26 years  ago. Her smoking use included cigarettes. She has a 1.25 pack-year smoking history. She has never used smokeless tobacco. She reports current alcohol use. She reports that she does not use drugs.  ALLERGIES:  Allergies  Allergen Reactions  . Hydrocodone-Acetaminophen Itching  . Nsaids Nausea And Vomiting       . Povidone-Iodine Rash       . Propoxyphene N-Acetaminophen Itching         MEDS:  Current Meds  Medication Sig  . albuterol (PROVENTIL HFA;VENTOLIN HFA) 108 (90 Base) MCG/ACT inhaler Inhale 2 puffs into the lungs every 4 (four) hours as needed for wheezing.  Marland Kitchen aspirin EC 81 MG tablet Take 81 mg by mouth daily.  Marland Kitchen atorvastatin (LIPITOR) 80 MG tablet Take 1 tablet (80 mg total) by mouth daily.  . Biotin w/ Vitamins C & E (HAIR/SKIN/NAILS PO) Take 3 tablets by mouth daily.  . busPIRone (BUSPAR) 15 MG tablet Take 1 tablet (15 mg total) by mouth 2 (two) times daily. Take 1/2 pill twice daily for 1-2 weeks and then increase to 1 pill twice daily (Patient taking differently: Take 15 mg by mouth 2 (two) times daily. )  . CALCIUM PO Take by mouth.  . diphenhydrAMINE (BENADRYL) 25 MG tablet Take 75 mg by mouth at bedtime as needed for sleep.  Marland Kitchen esomeprazole (NEXIUM) 40 MG capsule TAKE 1 CAPSULE BY MOUTH EVERY DAY IN THE MORNING  . fluticasone (FLONASE) 50 MCG/ACT nasal spray Place 2 sprays into both nostrils as needed.  . lamoTRIgine (LAMICTAL) 100 MG tablet TAKE 1 TABLET BY MOUTH EVERY DAY  . lamoTRIgine (LAMICTAL) 25 MG tablet 3 TABS AT BEDTIME for 2 weeks, then start 100mg  RX. (Patient taking differently: 100 mg. 3 TABS AT BEDTIME for 2 weeks, then start 100mg  RX.)  . meclizine (ANTIVERT) 25 MG tablet Take 1 tablet (25 mg total) by mouth 3 (three) times daily as needed for dizziness or nausea.  . metoprolol tartrate (LOPRESSOR) 25 MG tablet Take 1 tablet (25 mg total) by mouth 2 (two) times daily.  . montelukast (SINGULAIR) 10 MG tablet TAKE 1 TABLET BY MOUTH EVERYDAY AT BEDTIME  .  naproxen sodium (ALEVE) 220 MG tablet Take 440 mg by mouth daily as needed (pain).  . Oxcarbazepine (TRILEPTAL) 300 MG tablet TAKE 1 TABLET (300 MG TOTAL) BY MOUTH 3 (THREE) TIMES DAILY.  Marland Kitchen Prenatal Vit-Fe Fumarate-FA (PRENATAL PO) Take 1 tablet by mouth daily.  . vitamin C (ASCORBIC ACID) 500 MG tablet Take 500 mg by mouth daily.  . vitamin  E (VITAMIN E) 400 UNIT capsule Take 2 capsules (800 Units total) by mouth daily.     ROS: Pertinent items noted in HPI and remainder of comprehensive ROS otherwise negative.  Labs/Other Tests and Data Reviewed:    Recent Labs: 04/04/2018: TSH 3.13 06/05/2018: Hemoglobin 14.6; Platelets 270.0 06/12/2018: ALT 36; BUN 10; Creatinine, Ser 0.92; Potassium 4.4; Sodium 142   Recent Lipid Panel Lab Results  Component Value Date/Time   CHOL 283 (H) 04/04/2018 09:21 AM   TRIG 139.0 04/04/2018 09:21 AM   HDL 47.60 04/04/2018 09:21 AM   CHOLHDL 6 04/04/2018 09:21 AM   LDLCALC 208 (H) 04/04/2018 09:21 AM   LDLDIRECT 202.8 03/11/2012 01:53 PM    Wt Readings from Last 3 Encounters:  11/05/18 198 lb 3.2 oz (89.9 kg)  11/04/18 198 lb 2 oz (89.9 kg)  09/05/18 200 lb 12.8 oz (91.1 kg)     Exam:    Vital Signs:  BP 124/76   Pulse 61   Temp 98 F (36.7 C)   Ht 5' 8.5" (1.74 m)   Wt 198 lb 3.2 oz (89.9 kg)   LMP 03/06/2014   SpO2 97%   BMI 29.70 kg/m    General appearance: alert and no distress Lungs: No visual respiratory difficulty Extremities: extremities normal, atraumatic, no cyanosis or edema Skin: Skin color, texture, turgor normal. No rashes or lesions Neurologic: Mental status: Alert, oriented, thought content appropriate Psych: Pleasant  ASSESSMENT & PLAN:    1. Unstable angina 2. Dyslipidemia 3. Palpitations  Overall Ms. Gustafson it does feel somewhat better over the past couple months with medical therapy for possible coronary disease.  Unfortunately her CT coronary angiogram was deferred due to the COVID-19, however I would like to  reschedule that.  We will start to reinstitute testing in the next 2 to 4 weeks.  We will also need a repeat lipid profile to see if her statin medication is having a positive effect and a metabolic profile.  Follow-up with me after her CT.  COVID-19 Education: The signs and symptoms of COVID-19 were discussed with the patient and how to seek care for testing (follow up with PCP or arrange E-visit).  The importance of social distancing was discussed today.  Patient Risk:   After full review of this patients clinical status, I feel that they are at least moderate risk at this time.  Time:   Today, I have spent 15 minutes with the patient with telehealth technology discussing chest pain, dyslipidemia and palpitations.     Medication Adjustments/Labs and Tests Ordered: Current medicines are reviewed at length with the patient today.  Concerns regarding medicines are outlined above.   Tests Ordered: Orders Placed This Encounter  Procedures  . Lipid panel    Medication Changes: No orders of the defined types were placed in this encounter.   Disposition:  in 1 month(s)  Pixie Casino, MD, [facility], [place] Director of the [facility]id [facility] &  Cardiovascular [facility] Diplomate of the American Board of Clinical Lipidology Attending Cardiologist  Direct Dial: [phone]  Fax: [phone]  Website:  [email or web address]  Pixie Casino, MD  11/05/2018 4:30 PM

## 2018-11-11 LAB — LIPID PANEL
Chol/HDL Ratio: 2.7 ratio (ref 0.0–4.4)
Cholesterol, Total: 149 mg/dL (ref 100–199)
HDL: 55 mg/dL (ref >39)
LDL Calculated: 76 mg/dL (ref 0–99)
Triglycerides: 89 mg/dL (ref 0–149)
VLDL Cholesterol Cal: 18 mg/dL (ref 5–40)

## 2018-11-13 ENCOUNTER — Ambulatory Visit (INDEPENDENT_AMBULATORY_CARE_PROVIDER_SITE_OTHER): Payer: BC Managed Care – PPO | Admitting: Psychology

## 2018-11-13 DIAGNOSIS — F4323 Adjustment disorder with mixed anxiety and depressed mood: Secondary | ICD-10-CM | POA: Diagnosis not present

## 2018-11-17 ENCOUNTER — Other Ambulatory Visit: Payer: Self-pay | Admitting: Physician Assistant

## 2018-11-20 ENCOUNTER — Other Ambulatory Visit: Payer: Self-pay

## 2018-11-20 ENCOUNTER — Ambulatory Visit (INDEPENDENT_AMBULATORY_CARE_PROVIDER_SITE_OTHER): Payer: BC Managed Care – PPO | Admitting: Physician Assistant

## 2018-11-20 ENCOUNTER — Encounter: Payer: Self-pay | Admitting: Physician Assistant

## 2018-11-20 DIAGNOSIS — F411 Generalized anxiety disorder: Secondary | ICD-10-CM | POA: Diagnosis not present

## 2018-11-20 DIAGNOSIS — G4700 Insomnia, unspecified: Secondary | ICD-10-CM

## 2018-11-20 DIAGNOSIS — F3162 Bipolar disorder, current episode mixed, moderate: Secondary | ICD-10-CM | POA: Diagnosis not present

## 2018-11-20 MED ORDER — OXCARBAZEPINE 600 MG PO TABS: 600.0000 mg | ORAL_TABLET | Freq: Two times a day (BID) | ORAL | 0 refills | Status: DC

## 2018-11-20 MED ORDER — BUSPIRONE HCL 15 MG PO TABS: 15.0000 mg | ORAL_TABLET | Freq: Two times a day (BID) | ORAL | 3 refills | Status: DC

## 2018-11-20 NOTE — Progress Notes (Signed)
[facility]  Patient ID: Michele [name],  MRN: [id number]  PCP: Abner Greenspan, MD  Date of Evaluation: 11/20/2018 Time spent:15 minutes  Chief Complaint:  Chief Complaint    Follow-up     Virtual Visit via Telephone Note  I connected with patient by a video enabled telemedicine application or telephone, with their informed consent, and verified patient privacy and that I am speaking with the correct person using two identifiers.  I am private, in my home and the patient is home.  I discussed the limitations, risks, security and privacy concerns of performing an evaluation and management service by telephone and the availability of in person appointments. I also discussed with the patient that there may be a patient responsible charge related to this service. The patient expressed understanding and agreed to proceed.   I discussed the assessment and treatment plan with the patient. The patient was provided an opportunity to ask questions and all were answered. The patient agreed with the plan and demonstrated an understanding of the instructions.   The patient was advised to call back or seek an in-person evaluation if the symptoms worsen or if the condition fails to improve as anticipated.  I provided 15 minutes of non-face-to-face time during this encounter.  HISTORY/CURRENT STATUS: HPI For routine med check.   6 wks ago, we increased Lamictal. Responded well. At least 80% better overall! Will get sad sometimes but always triggered by something that makes her sad.  Still gets irritated easily, but it is not nearly as bad as before.  She feels like the medications are working but they might be able to work even better.  Her motivation and energy are good.  She does cry easily if she is watching a sad movie or something but otherwise not tearful.  She is isolating as much as she can due to the coronavirus pandemic.  She does go to the grocery store but that is about it.    Denies increased energy with decreased need for sleep.  No impulsivity or risky behavior.  No increased spending or libido.  No grandiosity.  Sleeps well most of the time.  Anxiety is controlled with BuSpar.  Denies dizziness, syncope, seizures, numbness, tingling, tremor, tics, unsteady gait, slurred speech, confusion. Denies muscle or joint pain, stiffness, or dystonia.  Individual Medical History/ Review of Systems: Changes? :No    Past medications for mental health diagnoses include: Buspar, Prozac, Paxil  Allergies: Hydrocodone-acetaminophen; Nsaids; Povidone-iodine; and Propoxyphene n-acetaminophen  Current Medications:  Current Outpatient Medications:  .  albuterol (PROVENTIL HFA;VENTOLIN HFA) 108 (90 Base) MCG/ACT inhaler, Inhale 2 puffs into the lungs every 4 (four) hours as needed for wheezing., Disp: 1 Inhaler, Rfl: 3 .  aspirin EC 81 MG tablet, Take 81 mg by mouth daily., Disp: , Rfl:  .  atorvastatin (LIPITOR) 80 MG tablet, Take 1 tablet (80 mg total) by mouth daily., Disp: 30 tablet, Rfl: 11 .  Biotin w/ Vitamins C & E (HAIR/SKIN/NAILS PO), Take 3 tablets by mouth daily., Disp: , Rfl:  .  busPIRone (BUSPAR) 15 MG tablet, Take 1 tablet (15 mg total) by mouth 2 (two) times daily., Disp: 180 tablet, Rfl: 3 .  CALCIUM PO, Take by mouth., Disp: , Rfl:  .  diphenhydrAMINE (BENADRYL) 25 MG tablet, Take 75 mg by mouth at bedtime as needed for sleep., Disp: , Rfl:  .  esomeprazole (NEXIUM) 40 MG capsule, TAKE 1 CAPSULE BY MOUTH EVERY DAY IN THE MORNING, Disp: 90 capsule,  Rfl: 1 .  fluticasone (FLONASE) 50 MCG/ACT nasal spray, Place 2 sprays into both nostrils as needed., Disp: 16 g, Rfl: 11 .  lamoTRIgine (LAMICTAL) 100 MG tablet, TAKE 1 TABLET BY MOUTH EVERY DAY, Disp: 90 tablet, Rfl: 0 .  meclizine (ANTIVERT) 25 MG tablet, Take 1 tablet (25 mg total) by mouth 3 (three) times daily as needed for dizziness or nausea., Disp: 30 tablet, Rfl: 0 .  metoprolol tartrate (LOPRESSOR) 25 MG  tablet, Take 1 tablet (25 mg total) by mouth 2 (two) times daily., Disp: 180 tablet, Rfl: 3 .  montelukast (SINGULAIR) 10 MG tablet, TAKE 1 TABLET BY MOUTH EVERYDAY AT BEDTIME, Disp: 90 tablet, Rfl: 3 .  naproxen sodium (ALEVE) 220 MG tablet, Take 440 mg by mouth daily as needed (pain)., Disp: , Rfl:  .  Prenatal Vit-Fe Fumarate-FA (PRENATAL PO), Take 1 tablet by mouth daily., Disp: , Rfl:  .  vitamin C (ASCORBIC ACID) 500 MG tablet, Take 500 mg by mouth daily., Disp: , Rfl:  .  vitamin E (VITAMIN E) 400 UNIT capsule, Take 2 capsules (800 Units total) by mouth daily., Disp: 60 capsule, Rfl: 5 .  oxcarbazepine (TRILEPTAL) 600 MG tablet, Take 1 tablet (600 mg total) by mouth 2 (two) times daily., Disp: 180 tablet, Rfl: 0 Medication Side Effects: none  Family Medical/ Social History: Changes? No  MENTAL HEALTH EXAM:  Last menstrual period 03/06/2014.There is no height or weight on file to calculate BMI.  General Appearance: unable to assess  Eye Contact:  unable to assess  Speech:  Clear and Coherent  Volume:  Normal  Mood:  Euthymic  Affect:  unable to assess  Thought Process:  Goal Directed  Orientation:  Full (Time, Place, and Person)  Thought Content: Logical   Suicidal Thoughts:  No  Homicidal Thoughts:  No  Memory:  WNL  Judgement:  Good  Insight:  Good  Psychomotor Activity:  unable to assess  Concentration:  Concentration: Good  Recall:  Good  Fund of Knowledge: Good  Language: Good  Assets:  Desire for Improvement  ADL's:  Intact  Cognition: WNL  Prognosis:  Good    DIAGNOSES:    ICD-10-CM   1. Bipolar 1 disorder, mixed, moderate (HCC) F31.62   2. Generalized anxiety disorder F41.1   3. Insomnia, unspecified type G47.00     Receiving Psychotherapy: No    RECOMMENDATIONS:  Increase Trileptal to 600 mg 1 twice daily. Continue Lamictal 100 mg p.o. daily. Continue BuSpar 15 mg twice daily. Continue multivitamins, vitamin C and E. Return in 4 to 6  weeks.  Donnal Moat, PA-C   This record has been created using [employer].  Chart creation errors have been sought, but may not always have been located and corrected. Such creation errors do not reflect on the standard of medical care.

## 2018-11-27 ENCOUNTER — Ambulatory Visit (INDEPENDENT_AMBULATORY_CARE_PROVIDER_SITE_OTHER): Payer: BC Managed Care – PPO | Admitting: Psychology

## 2018-11-27 DIAGNOSIS — F4323 Adjustment disorder with mixed anxiety and depressed mood: Secondary | ICD-10-CM

## 2018-12-03 LAB — BASIC METABOLIC PANEL
BUN/Creatinine Ratio: 13 (ref 9–23)
BUN: 10 mg/dL (ref 6–24)
CO2: 27 mmol/L (ref 20–29)
Calcium: 9.3 mg/dL (ref 8.7–10.2)
Chloride: 99 mmol/L (ref 96–106)
Creatinine, Ser: 0.77 mg/dL (ref 0.57–1.00)
GFR calc Af Amer: 102 mL/min/{1.73_m2} (ref >59)
GFR calc non Af Amer: 88 mL/min/{1.73_m2} (ref >59)
Glucose: 98 mg/dL (ref 65–99)
POTASSIUM: 4.9 mmol/L (ref 3.5–5.2)
Sodium: 140 mmol/L (ref 134–144)

## 2018-12-04 ENCOUNTER — Telehealth (HOSPITAL_COMMUNITY): Payer: Self-pay | Admitting: Emergency Medicine

## 2018-12-04 ENCOUNTER — Encounter: Payer: Self-pay | Admitting: Family Medicine

## 2018-12-04 NOTE — Telephone Encounter (Signed)
Reaching out to patient to offer assistance regarding upcoming cardiac imaging study; pt verbalizes understanding of appt date/time, parking situation and where to check in, pre-test NPO status and medications ordered, and verified current allergies; name and call back number provided for further questions should they arise Timmi Devora RN Navigator Cardiac Imaging [facility] [phone] office [phone] cell  Pt denies covid symptoms, verbalized understanding of visitor policy. 

## 2018-12-06 ENCOUNTER — Encounter: Payer: BC Managed Care – PPO | Admitting: *Deleted

## 2018-12-06 ENCOUNTER — Other Ambulatory Visit: Payer: Self-pay

## 2018-12-06 ENCOUNTER — Ambulatory Visit (HOSPITAL_COMMUNITY): Admission: RE | Admit: 2018-12-06 | Payer: BC Managed Care – PPO | Source: Ambulatory Visit

## 2018-12-06 ENCOUNTER — Ambulatory Visit (HOSPITAL_COMMUNITY)
Admission: RE | Admit: 2018-12-06 | Discharge: 2018-12-06 | Disposition: A | Discharge status: Home / Self Care | Payer: BC Managed Care – PPO | Source: Ambulatory Visit | Attending: Internal Medicine | Admitting: Internal Medicine

## 2018-12-06 DIAGNOSIS — R072 Precordial pain: Secondary | ICD-10-CM | POA: Diagnosis present

## 2018-12-06 DIAGNOSIS — Z006 Encounter for examination for normal comparison and control in clinical research program: Secondary | ICD-10-CM

## 2018-12-06 MED ORDER — IOHEXOL 350 MG/ML SOLN
80.0000 mL | Freq: Once | INTRAVENOUS | Status: AC | PRN
  Administered 2018-12-06: 80 mL via INTRAVENOUS

## 2018-12-06 MED ORDER — NITROGLYCERIN 0.4 MG SL SUBL
0.8000 mg | SUBLINGUAL_TABLET | Freq: Once | SUBLINGUAL | Status: AC
  Administered 2018-12-06: 09:00:00 0.8 mg via SUBLINGUAL
  Filled 2018-12-06: qty 25

## 2018-12-06 MED ORDER — NITROGLYCERIN 0.4 MG SL SUBL
SUBLINGUAL_TABLET | SUBLINGUAL | Status: AC
  Filled 2018-12-06: qty 2

## 2018-12-06 NOTE — Progress Notes (Signed)
CT scan completed, Tolerated well. D/C home walking awake and alert. In no distress.

## 2018-12-06 NOTE — Research (Signed)
Subject met inclusion and exclusion criteria.  The informed consent form, study requirements and expectations were reviewed with the subject and questions and concerns were addressed prior to the signing of the consent form.  The subject verbalized understanding of the trial requirements.  The subject agreed to participate in the CADFEM F Group 4 trial and signed the informed consent at 0730 on 12/06/2018.  The informed consent was obtained prior to performance of any protocol-specific procedures for the subject.  A copy of the signed informed consent was given to the subject and a copy was placed in the subject's medical record.   Philemon Kingdom D 12/06/2018 @ 0730

## 2018-12-11 ENCOUNTER — Ambulatory Visit (INDEPENDENT_AMBULATORY_CARE_PROVIDER_SITE_OTHER): Payer: BC Managed Care – PPO | Admitting: Psychology

## 2018-12-11 DIAGNOSIS — F4323 Adjustment disorder with mixed anxiety and depressed mood: Secondary | ICD-10-CM | POA: Diagnosis not present

## 2018-12-16 ENCOUNTER — Ambulatory Visit (INDEPENDENT_AMBULATORY_CARE_PROVIDER_SITE_OTHER): Payer: BC Managed Care – PPO | Admitting: Physician Assistant

## 2018-12-16 ENCOUNTER — Encounter: Payer: Self-pay | Admitting: Physician Assistant

## 2018-12-16 ENCOUNTER — Other Ambulatory Visit: Payer: Self-pay

## 2018-12-16 DIAGNOSIS — G4700 Insomnia, unspecified: Secondary | ICD-10-CM | POA: Diagnosis not present

## 2018-12-16 DIAGNOSIS — F411 Generalized anxiety disorder: Secondary | ICD-10-CM | POA: Diagnosis not present

## 2018-12-16 DIAGNOSIS — F3162 Bipolar disorder, current episode mixed, moderate: Secondary | ICD-10-CM | POA: Diagnosis not present

## 2018-12-16 NOTE — Progress Notes (Signed)
[facility]  Patient ID: Michele [name],  MRN: [id number]  PCP: Abner Greenspan, MD  Date of Evaluation: 12/16/2018 Time spent:15 minutes  Chief Complaint:  Chief Complaint    Follow-up     Virtual Visit via Telephone Note  I connected with patient by a video enabled telemedicine application or telephone, with their informed consent, and verified patient privacy and that I am speaking with the correct person using two identifiers.  I am private, in my office and the patient is home.   I discussed the limitations, risks, security and privacy concerns of performing an evaluation and management service by telephone and the availability of in person appointments. I also discussed with the patient that there may be a patient responsible charge related to this service. The patient expressed understanding and agreed to proceed.   I discussed the assessment and treatment plan with the patient. The patient was provided an opportunity to ask questions and all were answered. The patient agreed with the plan and demonstrated an understanding of the instructions.   The patient was advised to call back or seek an in-person evaluation if the symptoms worsen or if the condition fails to improve as anticipated.  I provided 15 minutes of non-face-to-face time during this encounter.  HISTORY/CURRENT STATUS: HPI For 4 week med check.  At [place], we increased the Trileptal.  She hasn't been as angry as she was.  Still anxious but has a lot to be anxious about.  Her Mom w/ alzheimers and she and her dad visit q evening so pt can give him a break, to watch a movie or something.  Also pt has a lawsuit against the woman who her husband had an affair w/.  Patient is getting her parents groceries and doing her best to keep them safe from the coronavirus as well as keeping herself safe.  Patient has been diagnosed with mild coronary artery disease.  So that worries her to.  "I am stressed and I get mad but I  do not fly off the handle like I used to.  I think overall I am doing okay."  When she has time, she is able to enjoy some things.  Energy and motivation are good.  She is isolating but only because of the coronavirus pandemic.  Patient denies increased energy with decreased need for sleep, no increased talkativeness, no racing thoughts, no impulsivity or risky behaviors, no increased spending, no increased libido, no grandiosity.  Denies dizziness, syncope, seizures, numbness, tingling, tremor, tics, unsteady gait, slurred speech, confusion. Denies muscle or joint pain, stiffness, or dystonia.  Individual Medical History/ Review of Systems: Changes? :Yes  dx w/ CAD, mild.   Past medications for mental health diagnoses include: Buspar, Prozac, Paxil  Allergies: Hydrocodone-acetaminophen, Nsaids, Povidone-iodine, and Propoxyphene n-acetaminophen  Current Medications:  Current Outpatient Medications:  .  albuterol (PROVENTIL HFA;VENTOLIN HFA) 108 (90 Base) MCG/ACT inhaler, Inhale 2 puffs into the lungs every 4 (four) hours as needed for wheezing., Disp: 1 Inhaler, Rfl: 3 .  aspirin EC 81 MG tablet, Take 81 mg by mouth daily., Disp: , Rfl:  .  atorvastatin (LIPITOR) 80 MG tablet, Take 1 tablet (80 mg total) by mouth daily., Disp: 30 tablet, Rfl: 11 .  Biotin w/ Vitamins C & E (HAIR/SKIN/NAILS PO), Take 3 tablets by mouth daily., Disp: , Rfl:  .  busPIRone (BUSPAR) 15 MG tablet, Take 1 tablet (15 mg total) by mouth 2 (two) times daily., Disp: 180 tablet, Rfl: 3 .  CALCIUM PO, Take by mouth., Disp: , Rfl:  .  diphenhydrAMINE (BENADRYL) 25 MG tablet, Take 75 mg by mouth at bedtime as needed for sleep., Disp: , Rfl:  .  esomeprazole (NEXIUM) 40 MG capsule, TAKE 1 CAPSULE BY MOUTH EVERY DAY IN THE MORNING, Disp: 90 capsule, Rfl: 1 .  fluticasone (FLONASE) 50 MCG/ACT nasal spray, Place 2 sprays into both nostrils as needed., Disp: 16 g, Rfl: 11 .  lamoTRIgine (LAMICTAL) 100 MG tablet, TAKE 1 TABLET  BY MOUTH EVERY DAY, Disp: 90 tablet, Rfl: 0 .  meclizine (ANTIVERT) 25 MG tablet, Take 1 tablet (25 mg total) by mouth 3 (three) times daily as needed for dizziness or nausea., Disp: 30 tablet, Rfl: 0 .  montelukast (SINGULAIR) 10 MG tablet, TAKE 1 TABLET BY MOUTH EVERYDAY AT BEDTIME, Disp: 90 tablet, Rfl: 3 .  naproxen sodium (ALEVE) 220 MG tablet, Take 440 mg by mouth daily as needed (pain)., Disp: , Rfl:  .  oxcarbazepine (TRILEPTAL) 600 MG tablet, Take 1 tablet (600 mg total) by mouth 2 (two) times daily., Disp: 180 tablet, Rfl: 0 .  Prenatal Vit-Fe Fumarate-FA (PRENATAL PO), Take 1 tablet by mouth daily., Disp: , Rfl:  .  vitamin C (ASCORBIC ACID) 500 MG tablet, Take 500 mg by mouth daily., Disp: , Rfl:  .  vitamin E (VITAMIN E) 400 UNIT capsule, Take 2 capsules (800 Units total) by mouth daily., Disp: 60 capsule, Rfl: 5 .  metoprolol tartrate (LOPRESSOR) 25 MG tablet, Take 1 tablet (25 mg total) by mouth 2 (two) times daily., Disp: 180 tablet, Rfl: 3 Medication Side Effects: none  Family Medical/ Social History: Changes? Yes see HPI  MENTAL HEALTH EXAM:  Last menstrual period 03/06/2014.There is no height or weight on file to calculate BMI.  General Appearance: Unable to assess  Eye Contact:  Unable to assess  Speech:  Clear and Coherent  Volume:  Normal  Mood:  Euthymic  Affect:  Unable to assess  Thought Process:  Goal Directed  Orientation:  Full (Time, Place, and Person)  Thought Content: Logical   Suicidal Thoughts:  No  Homicidal Thoughts:  No  Memory:  WNL  Judgement:  Good  Insight:  Good  Psychomotor Activity:  Unable to assess  Concentration:  Concentration: Good  Recall:  Good  Fund of Knowledge: Good  Language: Good  Assets:  Desire for Improvement  ADL's:  Intact  Cognition: WNL  Prognosis:  Good    DIAGNOSES:    ICD-10-CM   1. Generalized anxiety disorder  F41.1   2. Bipolar 1 disorder, mixed, moderate ([place])  F31.62   3. Insomnia, unspecified type   G47.00     Receiving Psychotherapy: Yes And she and her husband are planning to start couples counseling soon.   RECOMMENDATIONS:  We agreed to keep all medications the same for now.  Patient knows that some of the anxiety, sadness and anger are warranted because of the things she is going through in her life.  States she understands that a pill is not going to take care of all of that.  If things worsen however she can call and we will increase doses over the phone. Continue BuSpar 15 mg 1 twice daily. Continue Lamictal 100 mg daily. Continue Trileptal 600 mg 1 twice daily. Continue multivitamin, vitamin C, and vitamin E. Continue psychotherapy. Return in 2 months.  Donnal Moat, PA-C   This record has been created using [employer].  Chart creation errors have been sought, but  may not always have been located and corrected. Such creation errors do not reflect on the standard of medical care.

## 2018-12-27 ENCOUNTER — Ambulatory Visit (INDEPENDENT_AMBULATORY_CARE_PROVIDER_SITE_OTHER): Payer: BC Managed Care – PPO | Admitting: Psychology

## 2018-12-27 DIAGNOSIS — F411 Generalized anxiety disorder: Secondary | ICD-10-CM | POA: Diagnosis not present

## 2019-01-07 ENCOUNTER — Ambulatory Visit (INDEPENDENT_AMBULATORY_CARE_PROVIDER_SITE_OTHER): Payer: BC Managed Care – PPO | Admitting: Psychology

## 2019-01-07 DIAGNOSIS — F411 Generalized anxiety disorder: Secondary | ICD-10-CM | POA: Diagnosis not present

## 2019-01-21 ENCOUNTER — Ambulatory Visit: Payer: BC Managed Care – PPO | Admitting: Psychology

## 2019-01-24 ENCOUNTER — Ambulatory Visit (INDEPENDENT_AMBULATORY_CARE_PROVIDER_SITE_OTHER): Payer: BC Managed Care – PPO | Admitting: Psychology

## 2019-01-24 DIAGNOSIS — F411 Generalized anxiety disorder: Secondary | ICD-10-CM | POA: Diagnosis not present

## 2019-01-28 ENCOUNTER — Other Ambulatory Visit: Payer: Self-pay | Admitting: Physician Assistant

## 2019-01-31 ENCOUNTER — Encounter: Payer: Self-pay | Admitting: Family Medicine

## 2019-01-31 MED ORDER — PREDNISONE 10 MG PO TABS: ORAL_TABLET | ORAL | 0 refills | Status: DC

## 2019-01-31 NOTE — Telephone Encounter (Signed)
Pt called back asking if Dr Glori Bickers had read the message. I advised her that it looked like she has not responded to it yet. She has had patients all day.

## 2019-02-07 ENCOUNTER — Ambulatory Visit (INDEPENDENT_AMBULATORY_CARE_PROVIDER_SITE_OTHER): Payer: BC Managed Care – PPO | Admitting: Psychology

## 2019-02-07 DIAGNOSIS — F411 Generalized anxiety disorder: Secondary | ICD-10-CM | POA: Diagnosis not present

## 2019-02-11 ENCOUNTER — Other Ambulatory Visit: Payer: Self-pay | Admitting: Physician Assistant

## 2019-02-20 ENCOUNTER — Other Ambulatory Visit: Payer: Self-pay | Admitting: Family Medicine

## 2019-02-21 ENCOUNTER — Ambulatory Visit (INDEPENDENT_AMBULATORY_CARE_PROVIDER_SITE_OTHER): Payer: BC Managed Care – PPO | Admitting: Psychology

## 2019-02-21 DIAGNOSIS — F411 Generalized anxiety disorder: Secondary | ICD-10-CM

## 2019-03-04 ENCOUNTER — Ambulatory Visit (INDEPENDENT_AMBULATORY_CARE_PROVIDER_SITE_OTHER): Payer: BC Managed Care – PPO | Admitting: Psychology

## 2019-03-04 DIAGNOSIS — F411 Generalized anxiety disorder: Secondary | ICD-10-CM

## 2019-03-07 ENCOUNTER — Ambulatory Visit: Payer: BC Managed Care – PPO | Admitting: Psychology

## 2019-03-21 ENCOUNTER — Ambulatory Visit: Payer: BC Managed Care – PPO | Admitting: Psychology

## 2019-03-26 ENCOUNTER — Ambulatory Visit: Payer: BC Managed Care – PPO | Admitting: Psychology

## 2019-04-04 ENCOUNTER — Ambulatory Visit: Payer: BC Managed Care – PPO | Admitting: Psychology

## 2019-04-04 ENCOUNTER — Telehealth: Payer: Self-pay | Admitting: Physician Assistant

## 2019-04-04 NOTE — Telephone Encounter (Signed)
Patient called and said that she is having anxiety attacks and having a hard time breathing and chest hurting. She would like to increase her anxiety medicine or change medicine all together. She has an appointment scheduled for 10/30. Please give her a call at [phone]

## 2019-04-07 ENCOUNTER — Other Ambulatory Visit: Payer: Self-pay | Admitting: Physician Assistant

## 2019-04-07 MED ORDER — BUSPIRONE HCL 15 MG PO TABS: 15.0000 mg | ORAL_TABLET | Freq: Three times a day (TID) | ORAL | 1 refills | Status: DC

## 2019-04-07 MED ORDER — ALPRAZOLAM 0.5 MG PO TABS: 0.2500 mg | ORAL_TABLET | Freq: Two times a day (BID) | ORAL | 0 refills | Status: DC | PRN

## 2019-04-07 NOTE — Telephone Encounter (Signed)
Recommend increasing Buspar 15 mg from twice daily to 3 times daily.  This should help the anxiety in the long run but may take a couple of weeks to become effective. I am happy to give her a small amount of Xanax or Ativan or something similar that will work quicker if she would prefer to add that again.  Let me know and I will send the prescription in to her pharmacy.

## 2019-04-07 NOTE — Telephone Encounter (Signed)
I sent in Xanax and Buspar.  You don't have to call back unless y'all discussed that. Thanks

## 2019-04-08 ENCOUNTER — Other Ambulatory Visit: Payer: Self-pay

## 2019-04-08 ENCOUNTER — Ambulatory Visit (INDEPENDENT_AMBULATORY_CARE_PROVIDER_SITE_OTHER): Payer: BC Managed Care – PPO

## 2019-04-08 ENCOUNTER — Encounter: Payer: Self-pay | Admitting: Family Medicine

## 2019-04-08 DIAGNOSIS — Z23 Encounter for immunization: Secondary | ICD-10-CM

## 2019-04-08 NOTE — Telephone Encounter (Signed)
Given pt's age will route to PCP to advise on the pneumonia vaccine

## 2019-04-09 ENCOUNTER — Ambulatory Visit (INDEPENDENT_AMBULATORY_CARE_PROVIDER_SITE_OTHER): Payer: BC Managed Care – PPO | Admitting: Psychology

## 2019-04-09 DIAGNOSIS — F411 Generalized anxiety disorder: Secondary | ICD-10-CM | POA: Diagnosis not present

## 2019-04-18 ENCOUNTER — Ambulatory Visit: Payer: BC Managed Care – PPO | Admitting: Psychology

## 2019-04-25 ENCOUNTER — Other Ambulatory Visit: Payer: Self-pay | Admitting: Physician Assistant

## 2019-04-28 ENCOUNTER — Ambulatory Visit (INDEPENDENT_AMBULATORY_CARE_PROVIDER_SITE_OTHER): Payer: BC Managed Care – PPO | Admitting: Psychology

## 2019-04-28 DIAGNOSIS — F411 Generalized anxiety disorder: Secondary | ICD-10-CM | POA: Diagnosis not present

## 2019-04-30 ENCOUNTER — Other Ambulatory Visit: Payer: Self-pay | Admitting: Physician Assistant

## 2019-05-02 ENCOUNTER — Encounter: Payer: Self-pay | Admitting: Physician Assistant

## 2019-05-02 ENCOUNTER — Other Ambulatory Visit: Payer: Self-pay

## 2019-05-02 ENCOUNTER — Ambulatory Visit: Payer: BC Managed Care – PPO | Admitting: Psychology

## 2019-05-02 ENCOUNTER — Ambulatory Visit (INDEPENDENT_AMBULATORY_CARE_PROVIDER_SITE_OTHER): Payer: BC Managed Care – PPO | Admitting: Physician Assistant

## 2019-05-02 DIAGNOSIS — F411 Generalized anxiety disorder: Secondary | ICD-10-CM

## 2019-05-02 DIAGNOSIS — G4700 Insomnia, unspecified: Secondary | ICD-10-CM

## 2019-05-02 DIAGNOSIS — F3162 Bipolar disorder, current episode mixed, moderate: Secondary | ICD-10-CM | POA: Diagnosis not present

## 2019-05-02 MED ORDER — LAMOTRIGINE 25 MG PO TABS: ORAL_TABLET | ORAL | 1 refills | Status: DC

## 2019-05-02 MED ORDER — OXCARBAZEPINE 600 MG PO TABS: 600.0000 mg | ORAL_TABLET | Freq: Two times a day (BID) | ORAL | 0 refills | Status: DC

## 2019-05-02 MED ORDER — BUSPIRONE HCL 30 MG PO TABS: 30.0000 mg | ORAL_TABLET | Freq: Two times a day (BID) | ORAL | 1 refills | Status: DC
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
## 2019-05-05 ENCOUNTER — Ambulatory Visit (INDEPENDENT_AMBULATORY_CARE_PROVIDER_SITE_OTHER): Payer: BC Managed Care – PPO | Admitting: Psychology

## 2019-05-05 DIAGNOSIS — F411 Generalized anxiety disorder: Secondary | ICD-10-CM | POA: Diagnosis not present

## 2019-05-12 ENCOUNTER — Ambulatory Visit (INDEPENDENT_AMBULATORY_CARE_PROVIDER_SITE_OTHER): Payer: BC Managed Care – PPO | Admitting: Psychology

## 2019-05-12 DIAGNOSIS — F411 Generalized anxiety disorder: Secondary | ICD-10-CM

## 2019-05-16 ENCOUNTER — Ambulatory Visit: Payer: BC Managed Care – PPO | Admitting: Psychology

## 2019-05-19 ENCOUNTER — Ambulatory Visit (INDEPENDENT_AMBULATORY_CARE_PROVIDER_SITE_OTHER): Payer: BC Managed Care – PPO | Admitting: Psychology

## 2019-05-19 DIAGNOSIS — F411 Generalized anxiety disorder: Secondary | ICD-10-CM | POA: Diagnosis not present

## 2019-05-24 ENCOUNTER — Other Ambulatory Visit: Payer: Self-pay | Admitting: Physician Assistant

## 2019-05-25 NOTE — Telephone Encounter (Signed)
Can you ask if she's taking 50 mg daily with her 100 mg. Can send in updated Rx if she is

## 2019-05-26 ENCOUNTER — Ambulatory Visit: Payer: BC Managed Care – PPO | Admitting: Psychology

## 2019-05-26 NOTE — Telephone Encounter (Signed)
Left pt. A VM to return my call.

## 2019-05-27 ENCOUNTER — Other Ambulatory Visit: Payer: Self-pay

## 2019-05-27 NOTE — Telephone Encounter (Signed)
Spoke with pt. And She does want this to be updated to 1 of the 50 mg and 1 of the 100 mg.

## 2019-05-27 NOTE — Telephone Encounter (Signed)
There is not a 50 mg available, only 25 mg.

## 2019-05-30 ENCOUNTER — Ambulatory Visit: Payer: BC Managed Care – PPO | Admitting: Psychology

## 2019-05-31 ENCOUNTER — Other Ambulatory Visit: Payer: Self-pay | Admitting: Family Medicine

## 2019-05-31 ENCOUNTER — Other Ambulatory Visit: Payer: Self-pay | Admitting: Physician Assistant

## 2019-06-02 ENCOUNTER — Ambulatory Visit (INDEPENDENT_AMBULATORY_CARE_PROVIDER_SITE_OTHER): Payer: BC Managed Care – PPO | Admitting: Psychology

## 2019-06-02 DIAGNOSIS — F411 Generalized anxiety disorder: Secondary | ICD-10-CM

## 2019-06-03 ENCOUNTER — Other Ambulatory Visit: Payer: Self-pay

## 2019-06-03 DIAGNOSIS — J4520 Mild intermittent asthma, uncomplicated: Secondary | ICD-10-CM

## 2019-06-03 MED ORDER — MONTELUKAST SODIUM 10 MG PO TABS: ORAL_TABLET | ORAL | 3 refills | Status: DC

## 2019-06-09 ENCOUNTER — Ambulatory Visit (INDEPENDENT_AMBULATORY_CARE_PROVIDER_SITE_OTHER): Payer: BC Managed Care – PPO | Admitting: Psychology

## 2019-06-09 DIAGNOSIS — F411 Generalized anxiety disorder: Secondary | ICD-10-CM

## 2019-06-13 ENCOUNTER — Ambulatory Visit: Payer: BC Managed Care – PPO | Admitting: Psychology

## 2019-06-13 ENCOUNTER — Encounter: Payer: Self-pay | Admitting: Family Medicine

## 2019-06-13 ENCOUNTER — Ambulatory Visit: Payer: BC Managed Care – PPO | Admitting: Family Medicine

## 2019-06-13 ENCOUNTER — Other Ambulatory Visit: Payer: Self-pay

## 2019-06-13 DIAGNOSIS — R220 Localized swelling, mass and lump, head: Secondary | ICD-10-CM

## 2019-06-13 MED ORDER — AMOXICILLIN-POT CLAVULANATE 875-125 MG PO TABS: 1.0000 | ORAL_TABLET | Freq: Two times a day (BID) | ORAL | 0 refills | Status: DC

## 2019-06-13 NOTE — Patient Instructions (Signed)
Use warm compresses on the affected area  Take augmentin as directed -start it now   Do suck on some sour candy/ or liquid to promote salivation   If sudden worsening over the weekend (redness/swelling/tenderness) go to urgent care or ER Also if any dental pain or sensitivity   If any shortness of breath/ trouble swallowing- the same   Call and update [place] on Monday

## 2019-06-13 NOTE — Progress Notes (Signed)
Subjective:    Patient ID: Michele [name], female    DOB: 1966/05/23, 53 y.o.   MRN: [id number]  HPI Pt presents with swelling/redness left side of face  Noted some tenderness L angle of jaw last night  Then woke up - quite swollen  Gradually worse as the day went on  Dull ache- is tender to the touch  No swelling in mouth/ no dental problems   No sensitive teeth  No recent dental work  No headache   She does grind her teeth and wears a nite guard -is good about that   A little red just behind her ear  Warm to the touch   No trauma  No fever   No ear pain   She took aleve last night  Has not put anything on it   Patient Active Problem List   Diagnosis Date Noted  . Left facial swelling 06/13/2019  . BPV (benign positional vertigo) 11/04/2018  . 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 Palpitations 08/27/2018  . Precordial chest pain 08/27/2018  . Shortness of breath on exertion 08/27/2018  . Impetigo 08/27/2018  . Screen for STD (sexually transmitted disease) 04/04/2018  . Allergic rhinitis 01/10/2017  . RUQ abdominal pain 04/04/2016  . Adjustment reaction with anxious mood 08/18/2015  . Arm paresthesia, right 07/14/2015  . Diarrhea 10/13/2014  . Rectal pressure 10/01/2013  . Rectocele 10/01/2013  . Skin tag 04/23/2013  . Mixed incontinence 04/23/2013  . Encounter for routine gynecological examination 04/01/2012  . Prediabetes 04/01/2012  . Routine general medical examination at a health care facility 03/11/2012  . GERD 08/26/2010  . History of anemia 07/19/2010  . FREQUENCY, URINARY 07/06/2010  . MICROSCOPIC HEMATURIA 06/09/2010  . History of colonic polyps 11/15/2007  . IBS 10/08/2007  . ENDOMETRIOSIS 10/08/2007  . Mixed hyperlipidemia 10/30/2006  . Anxiety and depression 10/30/2006  . HEMORRHOIDS, INTERNAL 10/30/2006  . Asthma 10/30/2006  . MIGRAINES, HX OF 10/30/2006   Past Medical History:  Diagnosis Date  . Abnormal Pap smear of cervix    cryotx  . Anxiety   . Arthritis   . Asthma   . Depression   . Endometriosis   . Fatty liver   . Gastric polyp   . GERD (gastroesophageal reflux disease)   . Hemorrhoids   . Hiatal hernia   . HLD (hyperlipidemia)   . Hyperplastic colon polyp   . IBS (irritable bowel syndrome)   . Iron deficiency anemia 1/12  . Pneumonia   . Rectocele   . Rotator cuff tear, left 2009  . Syncope   . Tachycardia    s/p negative cardiac work up with echo  . Uterine fibroid [id number]   Past Surgical History:  Procedure Laterality Date  . CARPAL TUNNEL RELEASE Bilateral   . COLONOSCOPY  1999   small polyps, hemms  . ESOPHAGOGASTRODUODENOSCOPY  2/12   gastric polyp and HH  . HYSTEROSCOPY    . LAPAROSCOPY  2006   endometriosis  . ROTATOR CUFF REPAIR Left    Social History   Tobacco Use  . Smoking status: Former Smoker    Packs/day: 0.25    Years: 5.00    Pack years: 1.25    Types: Cigarettes    Quit date: 07/03/1992    Years since quitting: 26.9  . Smokeless tobacco: Never Used  Substance Use Topics  . Alcohol use: Yes    Alcohol/week: 0.0 standard drinks    Comment: Rarely, once every few months  . Drug  use: No   Family History  Problem Relation Age of Onset  . Hypertension Father   . Diabetes Father   . Stroke Father   . Migraines Father   . Barrett's esophagus Father   . Anemia Father        iron def  . Heart disease Father        pacemaker, a fib, CHF  . Bipolar disorder Father   . Dementia Mother   . Allergic rhinitis Mother   . Diabetes Sister   . Hyperlipidemia Sister   . Anxiety disorder Daughter   . Bipolar disorder Daughter   . ADD / ADHD Daughter   . Neuropathy Daughter   . Syncope episode Daughter   . Diabetes Paternal Grandfather   . COPD Paternal Grandmother   . Breast cancer Paternal Grandmother   . Angioedema Neg Hx   . Asthma Neg Hx   . Atopy Neg Hx   . Eczema Neg Hx   . Immunodeficiency Neg Hx   . Urticaria Neg Hx    Allergies  Allergen Reactions   . Hydrocodone-Acetaminophen Itching  . Nsaids Nausea And Vomiting       . Povidone-Iodine Rash       . Propoxyphene N-Acetaminophen Itching        Current Outpatient Medications on File Prior to Visit  Medication Sig Dispense Refill  . albuterol (VENTOLIN HFA) 108 (90 Base) MCG/ACT inhaler INHALE 2 PUFFS INTO THE LUNGS EVERY 4 HOURS AS NEEDED FOR WHEEZE 8.5 g 1  . ALPRAZolam (XANAX) 0.5 MG tablet Take 0.5-1 tablets (0.25-0.5 mg total) by mouth 2 (two) times daily as needed for anxiety. 60 tablet 0  . aspirin EC 81 MG tablet Take 81 mg by mouth daily.    Marland Kitchen atorvastatin (LIPITOR) 80 MG tablet Take 1 tablet (80 mg total) by mouth daily. 30 tablet 11  . Biotin w/ Vitamins C & E (HAIR/SKIN/NAILS PO) Take 3 tablets by mouth daily.    . busPIRone (BUSPAR) 30 MG tablet TAKE 1 TABLET (30 MG TOTAL) BY MOUTH 2 (TWO) TIMES DAILY. 180 tablet 0  . CALCIUM PO Take by mouth.    . diphenhydrAMINE (BENADRYL) 25 MG tablet Take 75 mg by mouth at bedtime as needed for sleep.    Marland Kitchen esomeprazole (NEXIUM) 40 MG capsule TAKE 1 CAPSULE BY MOUTH EVERY DAY IN THE MORNING 90 capsule 1  . fluticasone (FLONASE) 50 MCG/ACT nasal spray Place 2 sprays into both nostrils as needed. 16 g 11  . lamoTRIgine (LAMICTAL) 100 MG tablet TAKE 1 TABLET BY MOUTH EVERY DAY 90 tablet 0  . lamoTRIgine (LAMICTAL) 25 MG tablet take 2 pills daily, with 100mg  =150mg  60 tablet 2  . meclizine (ANTIVERT) 25 MG tablet Take 1 tablet (25 mg total) by mouth 3 (three) times daily as needed for dizziness or nausea. 30 tablet 0  . montelukast (SINGULAIR) 10 MG tablet TAKE 1 TABLET BY MOUTH EVERYDAY AT BEDTIME 90 tablet 3  . naproxen sodium (ALEVE) 220 MG tablet Take 440 mg by mouth daily as needed (pain).    Marland Kitchen oxcarbazepine (TRILEPTAL) 600 MG tablet TAKE 1 TABLET BY MOUTH TWICE A DAY 180 tablet 0  . predniSONE (DELTASONE) 10 MG tablet Take 3 pills once daily by mouth for 3 days, then 2 pills once daily for 3 days, then 1 pill once daily for 3 days  and then stop 18 tablet 0  . Prenatal Vit-Fe Fumarate-FA (PRENATAL PO) Take 1 tablet by mouth daily.    Marland Kitchen  vitamin C (ASCORBIC ACID) 500 MG tablet Take 500 mg by mouth daily.    . vitamin E (VITAMIN E) 400 UNIT capsule Take 2 capsules (800 Units total) by mouth daily. 60 capsule 5  . metoprolol tartrate (LOPRESSOR) 25 MG tablet Take 1 tablet (25 mg total) by mouth 2 (two) times daily. 180 tablet 3  . [DISCONTINUED] norethindrone-ethinyl estradiol-iron (MICROGESTIN FE1.5/30) 1.5-30 MG-MCG tablet Take 1 tablet by mouth daily.       No current facility-administered medications on file prior to visit.    Review of Systems  Constitutional: Negative for activity change, appetite change, fatigue, fever and unexpected weight change.  HENT: Positive for facial swelling. Negative for congestion, dental problem, ear discharge, ear pain, postnasal drip, rhinorrhea, sinus pressure, sinus pain, sneezing, sore throat, trouble swallowing and voice change.        Facial swelling on the L  Eyes: Negative for pain, redness and visual disturbance.  Respiratory: Negative for cough, shortness of breath and wheezing.   Cardiovascular: Negative for chest pain and palpitations.  Gastrointestinal: Negative for abdominal pain, blood in stool, constipation and diarrhea.  Endocrine: Negative for polydipsia and polyuria.  Genitourinary: Negative for dysuria, frequency and urgency.  Musculoskeletal: Negative for arthralgias, back pain and myalgias.  Skin: Negative for pallor and rash.  Allergic/Immunologic: Negative for environmental allergies.  Neurological: Negative for dizziness, syncope and headaches.  Hematological: Does not bruise/bleed easily.  Psychiatric/Behavioral: Negative for decreased concentration and dysphoric mood. The patient is not nervous/anxious.        Objective:   Physical Exam Constitutional:      General: She is not in acute distress.    Appearance: Normal appearance. She is obese. She is not  ill-appearing or diaphoretic.  HENT:     Head: Normocephalic and atraumatic.     Comments: Swelling of L face at angle of the jaw Small area of erythema just under ear lobe  No fluctuance Slight tenderness No M felt      Right Ear: Tympanic membrane, ear canal and external ear normal.     Left Ear: Tympanic membrane, ear canal and external ear normal.     Nose: Nose normal. No congestion or rhinorrhea.     Mouth/Throat:     Mouth: Mucous membranes are moist.     Pharynx: Oropharynx is clear. No oropharyngeal exudate or posterior oropharyngeal erythema.     Comments: No dental abnormalities Eyes:     General: No scleral icterus.       Right eye: No discharge.        Left eye: No discharge.     Extraocular Movements: Extraocular movements intact.     Conjunctiva/sclera: Conjunctivae normal.     Pupils: Pupils are equal, round, and reactive to light.  Neck:     Vascular: No carotid bruit.     Comments: Some swelling just under angle of jaw (mostly in face)   Cardiovascular:     Rate and Rhythm: Normal rate and regular rhythm.     Heart sounds: Normal heart sounds.  Pulmonary:     Effort: Pulmonary effort is normal. No respiratory distress.     Breath sounds: Normal breath sounds. No stridor. No wheezing, rhonchi or rales.  Musculoskeletal:        General: No signs of injury.     Cervical back: Normal range of motion and neck supple. No rigidity or tenderness.  Skin:    General: Skin is warm and dry.     Findings:  No rash.  Neurological:     Mental Status: She is alert.     Cranial Nerves: No cranial nerve deficit.     Sensory: No sensory deficit.  Psychiatric:        Mood and Affect: Mood normal.           Assessment & Plan:   Problem List Items Addressed This Visit      Other   Left facial swelling    At angle of the jaw with small area of erythema just under ear lobe  Disc poss of cellulitis vs gland swelling  Would expect parotiditis to be more anterior-  but did inst to enc salivation with sour candy or liquid tx with augmentin bid for 10 d with close f/u Low threshold for ENT eval /imaging if worse or no improvement

## 2019-06-13 NOTE — Telephone Encounter (Signed)
Patient called and left a message on triage line, wanted to make sure Dr Glori Bickers received her message from this morning. CB [phone]

## 2019-06-15 NOTE — Assessment & Plan Note (Signed)
At angle of the jaw with small area of erythema just under ear lobe  Disc poss of cellulitis vs gland swelling  Would expect parotiditis to be more anterior- but did inst to enc salivation with sour candy or liquid tx with augmentin bid for 10 d with close f/u Low threshold for ENT eval /imaging if worse or no improvement

## 2019-06-16 ENCOUNTER — Ambulatory Visit (INDEPENDENT_AMBULATORY_CARE_PROVIDER_SITE_OTHER): Payer: BC Managed Care – PPO | Admitting: Psychology

## 2019-06-16 DIAGNOSIS — F411 Generalized anxiety disorder: Secondary | ICD-10-CM | POA: Diagnosis not present

## 2019-06-17 ENCOUNTER — Telehealth: Payer: Self-pay | Admitting: *Deleted

## 2019-06-17 NOTE — Telephone Encounter (Signed)
Pt said her sxs are doing a lot better, yesterday pt could see significant improvement in the swelling. Today the swelling has almost resolved it's just still a little tender. Pt advise if sxs don't continue to improve to let us know. Pt thanked Dr. Glori Bickers for the call and said she will update [place] if needed.

## 2019-06-17 NOTE — Telephone Encounter (Signed)
-----   Message from Abner Greenspan, MD sent at 06/16/2019  4:50 PM EST ----- Please call her today or tomorrow to check on how her facial swelling is Thanks

## 2019-06-17 NOTE — Telephone Encounter (Signed)
Good to hear  Aware, thanks

## 2019-06-19 ENCOUNTER — Other Ambulatory Visit: Payer: Self-pay | Admitting: Internal Medicine

## 2019-06-19 NOTE — Telephone Encounter (Signed)
Rx request sent to pharmacy.  

## 2019-06-23 ENCOUNTER — Ambulatory Visit: Payer: BC Managed Care – PPO | Admitting: Psychology

## 2019-06-30 ENCOUNTER — Ambulatory Visit (INDEPENDENT_AMBULATORY_CARE_PROVIDER_SITE_OTHER): Payer: BC Managed Care – PPO | Admitting: Psychology

## 2019-06-30 DIAGNOSIS — F411 Generalized anxiety disorder: Secondary | ICD-10-CM | POA: Diagnosis not present

## 2019-07-07 ENCOUNTER — Ambulatory Visit (INDEPENDENT_AMBULATORY_CARE_PROVIDER_SITE_OTHER): Payer: BC Managed Care – PPO | Admitting: Psychology

## 2019-07-07 DIAGNOSIS — F411 Generalized anxiety disorder: Secondary | ICD-10-CM | POA: Diagnosis not present

## 2019-07-11 ENCOUNTER — Ambulatory Visit: Payer: BC Managed Care – PPO | Admitting: Psychology

## 2019-07-14 ENCOUNTER — Ambulatory Visit (INDEPENDENT_AMBULATORY_CARE_PROVIDER_SITE_OTHER): Payer: BC Managed Care – PPO | Admitting: Psychology

## 2019-07-14 DIAGNOSIS — F411 Generalized anxiety disorder: Secondary | ICD-10-CM

## 2019-07-21 ENCOUNTER — Ambulatory Visit (INDEPENDENT_AMBULATORY_CARE_PROVIDER_SITE_OTHER): Payer: BC Managed Care – PPO | Admitting: Psychology

## 2019-07-21 DIAGNOSIS — F411 Generalized anxiety disorder: Secondary | ICD-10-CM

## 2019-07-25 ENCOUNTER — Ambulatory Visit: Payer: BC Managed Care – PPO | Admitting: Psychology

## 2019-07-28 ENCOUNTER — Ambulatory Visit: Payer: BC Managed Care – PPO | Admitting: Psychology

## 2019-08-01 ENCOUNTER — Other Ambulatory Visit: Payer: Self-pay | Admitting: Physician Assistant

## 2019-08-04 ENCOUNTER — Ambulatory Visit (INDEPENDENT_AMBULATORY_CARE_PROVIDER_SITE_OTHER): Payer: BC Managed Care – PPO | Admitting: Psychology

## 2019-08-04 DIAGNOSIS — F411 Generalized anxiety disorder: Secondary | ICD-10-CM

## 2019-08-08 ENCOUNTER — Ambulatory Visit: Payer: BC Managed Care – PPO | Admitting: Psychology

## 2019-08-11 ENCOUNTER — Ambulatory Visit (INDEPENDENT_AMBULATORY_CARE_PROVIDER_SITE_OTHER): Payer: BC Managed Care – PPO | Admitting: Psychology

## 2019-08-11 DIAGNOSIS — F418 Other specified anxiety disorders: Secondary | ICD-10-CM

## 2019-08-18 ENCOUNTER — Ambulatory Visit (INDEPENDENT_AMBULATORY_CARE_PROVIDER_SITE_OTHER): Payer: BC Managed Care – PPO | Admitting: Psychology

## 2019-08-18 DIAGNOSIS — F411 Generalized anxiety disorder: Secondary | ICD-10-CM | POA: Diagnosis not present

## 2019-08-22 ENCOUNTER — Ambulatory Visit: Payer: BC Managed Care – PPO | Admitting: Psychology

## 2019-08-25 ENCOUNTER — Ambulatory Visit (INDEPENDENT_AMBULATORY_CARE_PROVIDER_SITE_OTHER)
Admission: RE | Admit: 2019-08-25 | Discharge: 2019-08-25 | Disposition: A | Discharge status: Home / Self Care | Payer: BC Managed Care – PPO | Source: Ambulatory Visit | Attending: Family Medicine | Admitting: Family Medicine

## 2019-08-25 ENCOUNTER — Ambulatory Visit: Payer: BC Managed Care – PPO | Admitting: Family Medicine

## 2019-08-25 ENCOUNTER — Other Ambulatory Visit: Payer: Self-pay

## 2019-08-25 ENCOUNTER — Ambulatory Visit (INDEPENDENT_AMBULATORY_CARE_PROVIDER_SITE_OTHER): Payer: BC Managed Care – PPO | Admitting: Psychology

## 2019-08-25 ENCOUNTER — Telehealth: Payer: Self-pay | Admitting: Family Medicine

## 2019-08-25 ENCOUNTER — Encounter: Payer: Self-pay | Admitting: Family Medicine

## 2019-08-25 VITALS — BP 126/74 | HR 74 | Temp 96.9°F | Ht 68.5 in | Wt 208.0 lb

## 2019-08-25 DIAGNOSIS — Z8249 Family history of ischemic heart disease and other diseases of the circulatory system: Secondary | ICD-10-CM | POA: Insufficient documentation

## 2019-08-25 DIAGNOSIS — M25511 Pain in right shoulder: Secondary | ICD-10-CM

## 2019-08-25 DIAGNOSIS — F411 Generalized anxiety disorder: Secondary | ICD-10-CM

## 2019-08-25 DIAGNOSIS — F3162 Bipolar disorder, current episode mixed, moderate: Secondary | ICD-10-CM | POA: Diagnosis not present

## 2019-08-25 MED ORDER — MELOXICAM 15 MG PO TABS: 15.0000 mg | ORAL_TABLET | Freq: Every day | ORAL | 1 refills | Status: DC | PRN

## 2019-08-25 NOTE — Progress Notes (Signed)
Subjective:    Patient ID: Michele [name], female    DOB: [birth year], 54 y.o.   MRN: [id number]  This visit occurred during the SARS-CoV-2 public health emergency.  Safety protocols were in place, including screening questions prior to the visit, additional usage of staff PPE, and extensive cleaning of exam room while observing appropriate contact time as indicated for disinfecting solutions.    HPI Pt presents with shoulder pain /arm pain since a fall in sept   (R)    Wt Readings from Last 3 Encounters:  08/25/19 208 lb (94.3 kg)  06/13/19 207 lb 5 oz (94 kg)  11/05/18 198 lb 3.2 oz (89.9 kg)   31.17 kg/m   Sept 24-she took her dad to the [place],  She fell down 4 steps onto a concrete floor after tripping  She scraped her elbow and hit her R side (hit hard on the floor) Was a little bruised  Went to their ER and she was given pain shot (? Toradol)   Since then  - pain is constant  Worse in cold weather  It wakes her up from sleep   Worsened by lifting at work or lying on the R side  Worst pain is over deltoid area and also her superior shoulder    No pain in back or neck  No weakness occ tingles in hands/arms - usually after lying wrong   Has not used heat or ice  Takes aleve otc - it helps some    Past hx of rotator cuff repair on the L   Mentioned sister has new DVT Unsure if provoked  Wants to know if she is at higher risk of blood clots due to this   Patient Active Problem List   Diagnosis Date Noted  . Right shoulder pain 08/25/2019  . Bipolar 1 disorder, mixed, moderate ([place]) 08/25/2019  . Family history of DVT 08/25/2019  . Left facial swelling 06/13/2019  . BPV (benign positional vertigo) 11/04/2018  . 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 Palpitations 08/27/2018  . Precordial chest pain 08/27/2018  . Shortness of breath on exertion 08/27/2018  . Impetigo 08/27/2018  . Screen for STD (sexually transmitted disease) 04/04/2018  . Allergic rhinitis  01/10/2017  . RUQ abdominal pain 04/04/2016  . Adjustment reaction with anxious mood 08/18/2015  . Arm paresthesia, right 07/14/2015  . Diarrhea 10/13/2014  . Rectal pressure 10/01/2013  . Rectocele 10/01/2013  . Skin tag 04/23/2013  . Mixed incontinence 04/23/2013  . Encounter for routine gynecological examination 04/01/2012  . Prediabetes 04/01/2012  . Routine general medical examination at a health care facility 03/11/2012  . GERD 08/26/2010  . History of anemia 07/19/2010  . FREQUENCY, URINARY 07/06/2010  . MICROSCOPIC HEMATURIA 06/09/2010  . History of colonic polyps 11/15/2007  . IBS 10/08/2007  . ENDOMETRIOSIS 10/08/2007  . Mixed hyperlipidemia 10/30/2006  . Anxiety and depression 10/30/2006  . HEMORRHOIDS, INTERNAL 10/30/2006  . Asthma 10/30/2006  . MIGRAINES, HX OF 10/30/2006   Past Medical History:  Diagnosis Date  . Abnormal Pap smear of cervix    cryotx  . Anxiety   . Arthritis   . Asthma   . Depression   . Endometriosis   . Fatty liver   . Gastric polyp   . GERD (gastroesophageal reflux disease)   . Hemorrhoids   . Hiatal hernia   . HLD (hyperlipidemia)   . Hyperplastic colon polyp   . IBS (irritable bowel syndrome)   . Iron deficiency anemia 1/12  .  Pneumonia   . Rectocele   . Rotator cuff tear, left 2009  . Syncope   . Tachycardia    s/p negative cardiac work up with echo  . Uterine fibroid [id number]   Past Surgical History:  Procedure Laterality Date  . CARPAL TUNNEL RELEASE Bilateral   . COLONOSCOPY  1999   small polyps, hemms  . ESOPHAGOGASTRODUODENOSCOPY  2/12   gastric polyp and HH  . HYSTEROSCOPY    . LAPAROSCOPY  2006   endometriosis  . ROTATOR CUFF REPAIR Left    Social History   Tobacco Use  . Smoking status: Former Smoker    Packs/day: 0.25    Years: 5.00    Pack years: 1.25    Types: Cigarettes    Quit date: 07/03/1992    Years since quitting: 27.1  . Smokeless tobacco: Never Used  Substance Use Topics  . Alcohol use: Yes     Alcohol/week: 0.0 standard drinks    Comment: Rarely, once every few months  . Drug use: No   Family History  Problem Relation Age of Onset  . Hypertension Father   . Diabetes Father   . Stroke Father   . Migraines Father   . Barrett's esophagus Father   . Anemia Father        iron def  . Heart disease Father        pacemaker, a fib, CHF  . Bipolar disorder Father   . Dementia Mother   . Allergic rhinitis Mother   . Diabetes Sister   . Hyperlipidemia Sister   . Deep vein thrombosis Sister   . Anxiety disorder Daughter   . Bipolar disorder Daughter   . ADD / ADHD Daughter   . Neuropathy Daughter   . Syncope episode Daughter   . Diabetes Paternal Grandfather   . COPD Paternal Grandmother   . Breast cancer Paternal Grandmother   . Angioedema Neg Hx   . Asthma Neg Hx   . Atopy Neg Hx   . Eczema Neg Hx   . Immunodeficiency Neg Hx   . Urticaria Neg Hx    Allergies  Allergen Reactions  . Hydrocodone-Acetaminophen Itching  . Povidone-Iodine Rash       . Propoxyphene N-Acetaminophen Itching        Current Outpatient Medications on File Prior to Visit  Medication Sig Dispense Refill  . albuterol (VENTOLIN HFA) 108 (90 Base) MCG/ACT inhaler INHALE 2 PUFFS INTO THE LUNGS EVERY 4 HOURS AS NEEDED FOR WHEEZE 8.5 g 1  . ALPRAZolam (XANAX) 0.5 MG tablet Take 0.5-1 tablets (0.25-0.5 mg total) by mouth 2 (two) times daily as needed for anxiety. 60 tablet 0  . aspirin EC 81 MG tablet Take 81 mg by mouth daily.    Marland Kitchen atorvastatin (LIPITOR) 80 MG tablet TAKE 1 TABLET BY MOUTH EVERY DAY 90 tablet 1  . Biotin w/ Vitamins C & E (HAIR/SKIN/NAILS PO) Take 3 tablets by mouth daily.    . busPIRone (BUSPAR) 30 MG tablet TAKE 1 TABLET (30 MG TOTAL) BY MOUTH 2 (TWO) TIMES DAILY. 180 tablet 0  . CALCIUM PO Take by mouth.    . diphenhydrAMINE (BENADRYL) 25 MG tablet Take 75 mg by mouth at bedtime as needed for sleep.    Marland Kitchen esomeprazole (NEXIUM) 40 MG capsule TAKE 1 CAPSULE BY MOUTH EVERY DAY  IN THE MORNING 90 capsule 1  . fluticasone (FLONASE) 50 MCG/ACT nasal spray Place 2 sprays into both nostrils as needed. 16 g 11  .  lamoTRIgine (LAMICTAL) 100 MG tablet TAKE 1 TABLET BY MOUTH EVERY DAY 90 tablet 0  . lamoTRIgine (LAMICTAL) 25 MG tablet TAKE 2 TABLETS BY MOUTH DAILY WITH 100MG  TABLET FOR TOTAL DAILY DOSE OF 150MG  180 tablet 0  . meclizine (ANTIVERT) 25 MG tablet Take 1 tablet (25 mg total) by mouth 3 (three) times daily as needed for dizziness or nausea. 30 tablet 0  . montelukast (SINGULAIR) 10 MG tablet TAKE 1 TABLET BY MOUTH EVERYDAY AT BEDTIME 90 tablet 3  . naproxen sodium (ALEVE) 220 MG tablet Take 440 mg by mouth daily as needed (pain).    Marland Kitchen oxcarbazepine (TRILEPTAL) 600 MG tablet TAKE 1 TABLET BY MOUTH TWICE A DAY 180 tablet 0  . Prenatal Vit-Fe Fumarate-FA (PRENATAL PO) Take 1 tablet by mouth daily.    . vitamin C (ASCORBIC ACID) 500 MG tablet Take 500 mg by mouth daily.    . vitamin E (VITAMIN E) 400 UNIT capsule Take 2 capsules (800 Units total) by mouth daily. 60 capsule 5  . metoprolol tartrate (LOPRESSOR) 25 MG tablet Take 1 tablet (25 mg total) by mouth 2 (two) times daily. 180 tablet 3  . [DISCONTINUED] norethindrone-ethinyl estradiol-iron (MICROGESTIN FE1.5/30) 1.5-30 MG-MCG tablet Take 1 tablet by mouth daily.       No current facility-administered medications on file prior to visit.     Review of Systems  Constitutional: Negative for activity change, appetite change, fatigue, fever and unexpected weight change.  HENT: Negative for congestion, ear pain, rhinorrhea, sinus pressure and sore throat.   Eyes: Negative for pain, redness and visual disturbance.  Respiratory: Negative for cough, shortness of breath and wheezing.   Cardiovascular: Negative for chest pain and palpitations.  Gastrointestinal: Negative for abdominal pain, blood in stool, constipation and diarrhea.  Endocrine: Negative for polydipsia and polyuria.  Genitourinary: Negative for dysuria,  frequency and urgency.  Musculoskeletal: Positive for arthralgias. Negative for back pain and myalgias.       Right shoulder pain   Skin: Negative for pallor and rash.  Allergic/Immunologic: Negative for environmental allergies.  Neurological: Negative for dizziness, syncope and headaches.  Hematological: Negative for adenopathy. Does not bruise/bleed easily.  Psychiatric/Behavioral: Negative for decreased concentration and dysphoric mood. The patient is not nervous/anxious.        Under care of psychiatry  Mood has been stable       Objective:   Physical Exam Constitutional:      General: She is not in acute distress.    Appearance: Normal appearance. She is obese. She is not ill-appearing or diaphoretic.  HENT:     Mouth/Throat:     Mouth: Mucous membranes are moist.  Eyes:     General: No scleral icterus.    Conjunctiva/sclera: Conjunctivae normal.     Pupils: Pupils are equal, round, and reactive to light.  Cardiovascular:     Rate and Rhythm: Normal rate and regular rhythm.  Pulmonary:     Effort: No respiratory distress.     Breath sounds: Normal breath sounds. No wheezing.  Musculoskeletal:        General: Tenderness and signs of injury present. No swelling or deformity.     Comments: Shoulder (R) No deformity/swelling/warmth or erythema  No crepitus  No obvious effusion  Abduction - 90 deg with pain  Hawking test -positive for ant shoulder pain Neer test -mildly pos  Internal rotation -unable to due to pain External rotation -good with some discomfort at end point Tenderness -mild over acromion and  also bicep tendon  Normal grip and hand dexterity Nl perfusion  Nl sensation      Skin:    General: Skin is warm and dry.     Findings: No erythema.  Neurological:     Mental Status: She is alert.     Sensory: No sensory deficit.     Motor: No weakness.     Coordination: Coordination normal.     Deep Tendon Reflexes: Reflexes normal.  Psychiatric:         Mood and Affect: Mood normal.           Assessment & Plan:   Problem List Items Addressed This Visit      Other   Right shoulder pain - Primary    After a fall onto that shoulder this past fall  Exam consistent with tendon etiology  Doubt full thickness rotator cuff tear given good rom  Xray now  Ice/heat recommended  Handout- shoulder rom exercises to prevent frozen shoulder  meloxicam (instead of aleve) with food daily prn (update if side eff)  Plan - after xray rad rev       Relevant Orders   DG Shoulder Right   Bipolar 1 disorder, mixed, moderate (H[facility])    Pt sees psychiatry- Donnal Moat  Xanax and buspar lamictal and trileptal  Overall stable currently       Family history of DVT    New-pt wanted to mention Unsure if provoked  Unsure of hypercoag state  She will find out re: stratifying her risks   Non smoker No hormones

## 2019-08-25 NOTE — Assessment & Plan Note (Signed)
Pt sees psychiatry- Donnal Moat  Xanax and buspar lamictal and trileptal  Overall stable currently

## 2019-08-25 NOTE — Telephone Encounter (Signed)
Referral done Sending to [facility]

## 2019-08-25 NOTE — Telephone Encounter (Signed)
-----   Message from Tammi Sou, [place] sent at 08/25/2019  3:57 PM EST ----- Pt viewed results on Mychart. She dose agree with Ortho referral. Please put referral in and I advised pt our [facility] will call to schedule appt

## 2019-08-25 NOTE — Patient Instructions (Addendum)
Try some heat and ice on shoulder 10 minutes at a time Keep moving but avoid heavy lifting   Instead of aleve try meloxicam 15 mg daily with a meal  Tylenol is ok also   Let's get an xray and go from there

## 2019-08-25 NOTE — Assessment & Plan Note (Signed)
New-pt wanted to mention Unsure if provoked  Unsure of hypercoag state  She will find out re: stratifying her risks   Non smoker No hormones

## 2019-08-25 NOTE — Assessment & Plan Note (Signed)
After a fall onto that shoulder this past fall  Exam consistent with tendon etiology  Doubt full thickness rotator cuff tear given good rom  Xray now  Ice/heat recommended  Handout- shoulder rom exercises to prevent frozen shoulder  meloxicam (instead of aleve) with food daily prn (update if side eff)  Plan - after xray rad rev

## 2019-08-26 ENCOUNTER — Other Ambulatory Visit: Payer: Self-pay | Admitting: Physician Assistant

## 2019-08-26 ENCOUNTER — Encounter: Payer: Self-pay | Admitting: Physician Assistant

## 2019-08-26 ENCOUNTER — Ambulatory Visit (INDEPENDENT_AMBULATORY_CARE_PROVIDER_SITE_OTHER): Payer: BC Managed Care – PPO | Admitting: Physician Assistant

## 2019-08-26 DIAGNOSIS — F3162 Bipolar disorder, current episode mixed, moderate: Secondary | ICD-10-CM | POA: Diagnosis not present

## 2019-08-26 DIAGNOSIS — F411 Generalized anxiety disorder: Secondary | ICD-10-CM

## 2019-08-26 DIAGNOSIS — G4700 Insomnia, unspecified: Secondary | ICD-10-CM

## 2019-08-26 MED ORDER — BUSPIRONE HCL 30 MG PO TABS: 30.0000 mg | ORAL_TABLET | Freq: Two times a day (BID) | ORAL | 0 refills | Status: DC

## 2019-08-26 MED ORDER — OXCARBAZEPINE 600 MG PO TABS: 600.0000 mg | ORAL_TABLET | Freq: Two times a day (BID) | ORAL | 0 refills | Status: DC

## 2019-08-26 NOTE — Progress Notes (Signed)
[facility] Med Check  Patient ID: Michele [name],  MRN: [id number]  PCP: Abner Greenspan, MD  Date of Evaluation: 08/26/2019 Time spent:20 minutes  Chief Complaint:  Chief Complaint    Anxiety; Depression; Medication Refill     Virtual Visit via Telephone Note  I connected with patient by a video enabled telemedicine application or telephone, with their informed consent, and verified patient privacy and that I am speaking with the correct person using two identifiers.  I am private, in my office and the patient is home.  I discussed the limitations, risks, security and privacy concerns of performing an evaluation and management service by telephone and the availability of in person appointments. I also discussed with the patient that there may be a patient responsible charge related to this service. The patient expressed understanding and agreed to proceed.   I discussed the assessment and treatment plan with the patient. The patient was provided an opportunity to ask questions and all were answered. The patient agreed with the plan and demonstrated an understanding of the instructions.   The patient was advised to call back or seek an in-person evaluation if the symptoms worsen or if the condition fails to improve as anticipated.  I provided 20 minutes of non-face-to-face time during this encounter.  HISTORY/CURRENT STATUS: HPI for routine med check.  At the last visit in October, we increased the Lamictal as well as the BuSpar patient states she is feeling quite a bit better. She does not worry as much over things and have anxiety over things that she can't control. She has probably taken around 10 Xanax since that visit. It has helped. States it is helpful just to know that it is there, even if she does not take it. She is able to enjoy things. Energy and motivation are good. Appetite is normal. Weight is stable. She does not cry easily. She sleeps well most of the time. No suicidal  or homicidal thoughts.  Patient denies increased energy with decreased need for sleep, no increased talkativeness, no racing thoughts, no impulsivity or risky behaviors, no increased spending, no increased libido, no grandiosity.  Denies dizziness, syncope, seizures, numbness, tingling, tremor, tics, unsteady gait, slurred speech, confusion. Denies muscle or joint pain, stiffness, or dystonia.  Individual Medical History/ Review of Systems: Changes? :Yes  hurt her shoulder.  Will be seeing Ortho soon.  Past medications for mental health diagnoses include: Buspar, Prozac, Paxil  Allergies: Hydrocodone-acetaminophen, Povidone-iodine, and Propoxyphene n-acetaminophen  Current Medications:  Current Outpatient Medications:  .  albuterol (VENTOLIN HFA) 108 (90 Base) MCG/ACT inhaler, INHALE 2 PUFFS INTO THE LUNGS EVERY 4 HOURS AS NEEDED FOR WHEEZE, Disp: 8.5 g, Rfl: 1 .  ALPRAZolam (XANAX) 0.5 MG tablet, Take 0.5-1 tablets (0.25-0.5 mg total) by mouth 2 (two) times daily as needed for anxiety., Disp: 60 tablet, Rfl: 0 .  aspirin EC 81 MG tablet, Take 81 mg by mouth daily., Disp: , Rfl:  .  atorvastatin (LIPITOR) 80 MG tablet, TAKE 1 TABLET BY MOUTH EVERY DAY, Disp: 90 tablet, Rfl: 1 .  Biotin w/ Vitamins C & E (HAIR/SKIN/NAILS PO), Take 3 tablets by mouth daily., Disp: , Rfl:  .  busPIRone (BUSPAR) 30 MG tablet, Take 1 tablet (30 mg total) by mouth 2 (two) times daily., Disp: 180 tablet, Rfl: 0 .  CALCIUM PO, Take by mouth., Disp: , Rfl:  .  diphenhydrAMINE (BENADRYL) 25 MG tablet, Take 75 mg by mouth at bedtime as needed for sleep., Disp: , Rfl:  .  esomeprazole (NEXIUM) 40 MG capsule, TAKE 1 CAPSULE BY MOUTH EVERY DAY IN THE MORNING, Disp: 90 capsule, Rfl: 1 .  fluticasone (FLONASE) 50 MCG/ACT nasal spray, Place 2 sprays into both nostrils as needed., Disp: 16 g, Rfl: 11 .  meclizine (ANTIVERT) 25 MG tablet, Take 1 tablet (25 mg total) by mouth 3 (three) times daily as needed for dizziness or  nausea., Disp: 30 tablet, Rfl: 0 .  meloxicam (MOBIC) 15 MG tablet, Take 1 tablet (15 mg total) by mouth daily as needed for pain. With food, Disp: 30 tablet, Rfl: 1 .  montelukast (SINGULAIR) 10 MG tablet, TAKE 1 TABLET BY MOUTH EVERYDAY AT BEDTIME, Disp: 90 tablet, Rfl: 3 .  naproxen sodium (ALEVE) 220 MG tablet, Take 440 mg by mouth daily as needed (pain)., Disp: , Rfl:  .  oxcarbazepine (TRILEPTAL) 600 MG tablet, Take 1 tablet (600 mg total) by mouth 2 (two) times daily., Disp: 180 tablet, Rfl: 0 .  Prenatal Vit-Fe Fumarate-FA (PRENATAL PO), Take 1 tablet by mouth daily., Disp: , Rfl:  .  vitamin C (ASCORBIC ACID) 500 MG tablet, Take 500 mg by mouth daily., Disp: , Rfl:  .  vitamin E (VITAMIN E) 400 UNIT capsule, Take 2 capsules (800 Units total) by mouth daily., Disp: 60 capsule, Rfl: 5 .  metoprolol tartrate (LOPRESSOR) 25 MG tablet, Take 1 tablet (25 mg total) by mouth 2 (two) times daily., Disp: 180 tablet, Rfl: 3 Medication Side Effects: none  Family Medical/ Social History: Changes? Yes  Her parents moved back in with her and husband.   Her Mom's dementia is worse.  Pt and husband are still having having trouble and are in counseling. 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   MENTAL HEALTH EXAM:  Last menstrual period 03/06/2014.There is no height or weight on file to calculate BMI.  General Appearance: unable to assess  Eye Contact:  unabe to assess  Speech:  Clear and Coherent  Volume:  Normal  Mood:  Euthymic  Affect:  unable to assess  Thought Process:  Goal Directed and Descriptions of Associations: Intact  Orientation:  Full (Time, Place, and Person)  Thought Content: Logical   Suicidal Thoughts:  No  Homicidal Thoughts:  No  Memory:  WNL  Judgement:  Good  Insight:  Good  Psychomotor Activity:  unable to assess  Concentration:  Concentration: Good  Recall:  Good  Fund of Knowledge: Good  Language:  Good  Assets:  Desire for Improvement  ADL's:  Intact  Cognition: WNL  Prognosis:  Good    DIAGNOSES:    ICD-10-CM   1. Generalized anxiety disorder  F41.1   2. Bipolar 1 disorder, mixed, moderate ([place])  F31.62   3. Insomnia, unspecified type  G47.00     Receiving Psychotherapy: Yes    RECOMMENDATIONS:  I spent 20 minutes with her. PDMP was reviewed. I'm glad to hear that she is doing so well. Continue Xanax 0.5 mg, 1/2-1 p.o. twice daily as needed. Continue BuSpar 30 mg, 1 p.o. twice daily. Continue oxcarbazepine 600 mg, 1 p.o. twice daily. Continue Lamictal 150 mg daily. Once she runs out of the Lamictal current pills, she'll call and we will either send in a 150 mg pill or 100 mg and she can take 1.5 pills. Continue counseling. Return in 6 months.  Donnal Moat, PA-C

## 2019-08-27 ENCOUNTER — Other Ambulatory Visit: Payer: Self-pay | Admitting: Internal Medicine

## 2019-08-29 ENCOUNTER — Ambulatory Visit: Payer: BC Managed Care – PPO | Admitting: Orthopaedic Surgery

## 2019-08-29 ENCOUNTER — Other Ambulatory Visit: Payer: Self-pay

## 2019-08-29 ENCOUNTER — Encounter: Payer: Self-pay | Admitting: Orthopaedic Surgery

## 2019-08-29 DIAGNOSIS — M7551 Bursitis of right shoulder: Secondary | ICD-10-CM

## 2019-08-29 MED ORDER — LIDOCAINE HCL 2 % IJ SOLN
2.0000 mL | INTRAMUSCULAR | Status: AC | PRN
  Administered 2019-08-29: 09:00:00 2 mL

## 2019-08-29 MED ORDER — METHYLPREDNISOLONE ACETATE 40 MG/ML IJ SUSP
40.0000 mg | INTRAMUSCULAR | Status: AC | PRN
  Administered 2019-08-29: 40 mg via INTRA_ARTICULAR

## 2019-08-29 MED ORDER — BUPIVACAINE HCL 0.25 % IJ SOLN
2.0000 mL | INTRAMUSCULAR | Status: AC | PRN
  Administered 2019-08-29: 09:00:00 2 mL via INTRA_ARTICULAR

## 2019-08-29 NOTE — Progress Notes (Signed)
Office Visit Note   Patient: Michele [name]           Date of Birth: July 03, 1966           MRN: [id number] Visit Date: 08/29/2019              Requested by: Tower, Wynelle Fanny, MD [place],  [place] [id number] PCP: Abner Greenspan, MD   Assessment & Plan: Visit Diagnoses:  1. Bursitis of right shoulder     Plan: Impression is right shoulder subacromial bursitis, rotator cuff tendinitis and biceps tendinitis.  We have elected to inject the subacromial space with cortisone today.  We will also start her in formal physical therapy and an external referral has been made.  She will call us if she has not heard from physical therapy within the next week.  If she does not notice significant improvement of symptoms over the next 6 weeks, she will let us know we will obtain an MRI of the right shoulder to assess for structural abnormalities.  Call with concerns or questions in the meantime.  Follow-Up Instructions: Return if symptoms worsen or fail to improve.   Orders:  Orders Placed This Encounter  Procedures  . Large Joint Inj: R subacromial bursa  . Ambulatory referral to Physical Therapy   No orders of the defined types were placed in this encounter.     Procedures: Large Joint Inj: R subacromial bursa on 08/29/2019 8:37 AM Indications: pain Details: 22 G needle Medications: 2 mL lidocaine 2 %; 2 mL bupivacaine 0.25 %; 40 mg methylPREDNISolone acetate 40 MG/ML Outcome: tolerated well, no immediate complications Patient was prepped and draped in the usual sterile fashion.       Clinical Data: No additional findings.   Subjective: Chief Complaint  Patient presents with  . Right Shoulder - Pain    HPI patient is a pleasant 54 year old female who comes in today for follow-up of an injury to her right shoulder.  Back in September 2020, she was in a cafeteria holding a tray when she fell down 4 stairs landing with her arms out in front of her.  She has had pain to  the entire right shoulder and into the deltoid and bicep since.  She admits that this is a constant ache.  Pain is worse with cold weather and abduction of the shoulder.  She also notes a rolling type feeling towards the biceps it does elicit pain.  She has been taking Aleve and Tylenol with moderate relief of symptoms.  She does note numbness, tingling and burning to the thumb, index and long fingers of both hands but does note previous history of carpal tunnel release.  No previous cortisone injection or surgical intervention to the right shoulder.  She is status post left shoulder rotator cuff repair from a traumatic tear back in 2013.  Doing well there.  Review of Systems as detailed in HPI.  All others reviewed and are negative.   Objective: Vital Signs: LMP 03/06/2014   Physical Exam well-developed well-nourished female no acute distress.  Alert oriented x3.  Ortho Exam examination of her right shoulder reveals near full active range of motion in all planes.  She does have pain with empty can but no weakness.  Minimally positive speeds and O'Brien's.  Moderate tenderness to the [facility] joint.  Mild pain with cross body adduction.  She has near full strength throughout.  She is neurovascular intact distally.  Specialty Comments:  No  specialty comments available.  Imaging: X-rays reviewed by me in canopy reveal moderate degenerative changes the [facility] joint.  No superior migration humeral head.  No other acute findings.   PMFS History: Patient Active Problem List   Diagnosis Date Noted  . Right shoulder pain 08/25/2019  . Bipolar 1 disorder, mixed, moderate ([place]) 08/25/2019  . Family history of DVT 08/25/2019  . Left facial swelling 06/13/2019  . BPV (benign positional vertigo) 11/04/2018  . 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 Palpitations 08/27/2018  . Precordial chest pain 08/27/2018  . Shortness of breath on exertion 08/27/2018  . Impetigo 08/27/2018  . Screen for STD (sexually  transmitted disease) 04/04/2018  . Allergic rhinitis 01/10/2017  . RUQ abdominal pain 04/04/2016  . Adjustment reaction with anxious mood 08/18/2015  . Arm paresthesia, right 07/14/2015  . Diarrhea 10/13/2014  . Rectal pressure 10/01/2013  . Rectocele 10/01/2013  . Skin tag 04/23/2013  . Mixed incontinence 04/23/2013  . Encounter for routine gynecological examination 04/01/2012  . Prediabetes 04/01/2012  . Routine general medical examination at a health care facility 03/11/2012  . GERD 08/26/2010  . History of anemia 07/19/2010  . FREQUENCY, URINARY 07/06/2010  . MICROSCOPIC HEMATURIA 06/09/2010  . History of colonic polyps 11/15/2007  . IBS 10/08/2007  . ENDOMETRIOSIS 10/08/2007  . Mixed hyperlipidemia 10/30/2006  . Anxiety and depression 10/30/2006  . HEMORRHOIDS, INTERNAL 10/30/2006  . Asthma 10/30/2006  . MIGRAINES, HX OF 10/30/2006   Past Medical History:  Diagnosis Date  . Abnormal Pap smear of cervix    cryotx  . Anxiety   . Arthritis   . Asthma   . Depression   . Endometriosis   . Fatty liver   . Gastric polyp   . GERD (gastroesophageal reflux disease)   . Hemorrhoids   . Hiatal hernia   . HLD (hyperlipidemia)   . Hyperplastic colon polyp   . IBS (irritable bowel syndrome)   . Iron deficiency anemia 1/12  . Pneumonia   . Rectocele   . Rotator cuff tear, left 2009  . Syncope   . Tachycardia    s/p negative cardiac work up with echo  . Uterine fibroid [id number]    Family History  Problem Relation Age of Onset  . Hypertension Father   . Diabetes Father   . Stroke Father   . Migraines Father   . Barrett's esophagus Father   . Anemia Father        iron def  . Heart disease Father        pacemaker, a fib, CHF  . Bipolar disorder Father   . Dementia Mother   . Allergic rhinitis Mother   . Diabetes Sister   . Hyperlipidemia Sister   . Deep vein thrombosis Sister   . Anxiety disorder Daughter   . Bipolar disorder Daughter   . ADD / ADHD Daughter   .  Neuropathy Daughter   . Syncope episode Daughter   . Diabetes Paternal Grandfather   . COPD Paternal Grandmother   . Breast cancer Paternal Grandmother   . Angioedema Neg Hx   . Asthma Neg Hx   . Atopy Neg Hx   . Eczema Neg Hx   . Immunodeficiency Neg Hx   . Urticaria Neg Hx     Past Surgical History:  Procedure Laterality Date  . CARPAL TUNNEL RELEASE Bilateral   . COLONOSCOPY  1999   small polyps, hemms  . ESOPHAGOGASTRODUODENOSCOPY  2/12   gastric polyp and HH  .  HYSTEROSCOPY    . LAPAROSCOPY  2006   endometriosis  . ROTATOR CUFF REPAIR Left    Social History   Occupational History  . Occupation: unemployed  Tobacco Use  . Smoking status: Former Smoker    Packs/day: 0.25    Years: 5.00    Pack years: 1.25    Types: Cigarettes    Quit date: 07/03/1992    Years since quitting: 27.1  . Smokeless tobacco: Never Used  Substance and Sexual Activity  . Alcohol use: Yes    Alcohol/week: 0.0 standard drinks    Comment: Rarely, once every few months  . Drug use: No  . Sexual activity: Yes    Partners: Male    Birth control/protection: None

## 2019-09-01 ENCOUNTER — Ambulatory Visit (INDEPENDENT_AMBULATORY_CARE_PROVIDER_SITE_OTHER): Payer: BC Managed Care – PPO | Admitting: Psychology

## 2019-09-01 DIAGNOSIS — F411 Generalized anxiety disorder: Secondary | ICD-10-CM | POA: Diagnosis not present

## 2019-09-05 ENCOUNTER — Ambulatory Visit: Payer: BC Managed Care – PPO | Admitting: Psychology

## 2019-09-08 ENCOUNTER — Ambulatory Visit: Payer: BC Managed Care – PPO | Admitting: Psychology

## 2019-09-15 ENCOUNTER — Ambulatory Visit (INDEPENDENT_AMBULATORY_CARE_PROVIDER_SITE_OTHER): Payer: BC Managed Care – PPO | Admitting: Psychology

## 2019-09-15 DIAGNOSIS — F411 Generalized anxiety disorder: Secondary | ICD-10-CM

## 2019-09-19 ENCOUNTER — Ambulatory Visit: Payer: BC Managed Care – PPO | Admitting: Psychology

## 2019-09-22 ENCOUNTER — Ambulatory Visit: Payer: BC Managed Care – PPO | Admitting: Psychology

## 2019-09-29 ENCOUNTER — Ambulatory Visit (INDEPENDENT_AMBULATORY_CARE_PROVIDER_SITE_OTHER): Payer: BC Managed Care – PPO | Admitting: Psychology

## 2019-09-29 DIAGNOSIS — F411 Generalized anxiety disorder: Secondary | ICD-10-CM

## 2019-10-05 ENCOUNTER — Other Ambulatory Visit: Payer: Self-pay | Admitting: Physician Assistant

## 2019-10-06 ENCOUNTER — Other Ambulatory Visit: Payer: Self-pay | Admitting: Physician Assistant

## 2019-10-06 ENCOUNTER — Ambulatory Visit: Payer: BC Managed Care – PPO | Admitting: Psychology

## 2019-10-06 MED ORDER — LAMOTRIGINE 150 MG PO TABS: 150.0000 mg | ORAL_TABLET | Freq: Every day | ORAL | 1 refills | Status: DC

## 2019-10-06 NOTE — Telephone Encounter (Signed)
Change to 150 mg?

## 2019-10-06 NOTE — Telephone Encounter (Signed)
Should be on 150 mg

## 2019-10-13 ENCOUNTER — Ambulatory Visit (INDEPENDENT_AMBULATORY_CARE_PROVIDER_SITE_OTHER): Payer: BC Managed Care – PPO | Admitting: Psychology

## 2019-10-13 DIAGNOSIS — F411 Generalized anxiety disorder: Secondary | ICD-10-CM

## 2019-10-15 ENCOUNTER — Telehealth (INDEPENDENT_AMBULATORY_CARE_PROVIDER_SITE_OTHER): Payer: BC Managed Care – PPO | Admitting: Family Medicine

## 2019-10-15 ENCOUNTER — Encounter: Payer: Self-pay | Admitting: Family Medicine

## 2019-10-15 VITALS — BP 142/81 | HR 62 | Temp 97.0°F

## 2019-10-15 DIAGNOSIS — M65311 Trigger thumb, right thumb: Secondary | ICD-10-CM | POA: Diagnosis not present

## 2019-10-15 DIAGNOSIS — K580 Irritable bowel syndrome with diarrhea: Secondary | ICD-10-CM | POA: Diagnosis not present

## 2019-10-15 DIAGNOSIS — G5712 Meralgia paresthetica, left lower limb: Secondary | ICD-10-CM | POA: Diagnosis not present

## 2019-10-15 DIAGNOSIS — G5710 Meralgia paresthetica, unspecified lower limb: Secondary | ICD-10-CM | POA: Insufficient documentation

## 2019-10-15 NOTE — Progress Notes (Signed)
   Subjective:    Patient ID: Michele [name], female    DOB: 16-Nov-1965, 54 y.o.   MRN: [id number]  HPI Pt presents with trouble in finger (trigger finger) and also diarrhea   Wt Readings from Last 3 Encounters:  08/25/19 208 lb (94.3 kg)  06/13/19 207 lb 5 oz (94 kg)  11/05/18 198 lb 3.2 oz (89.9 kg)   Having a diarrhea spell  Liquid stool  She has this occasionally  Likely IBS Thinks salads make this worse  Rumbling but no pain  Some cramping and urgency of stool  No blood in stool  occ has to take immodium if it lasts more than a day  Had to stay out of work today   R thumb is triggering  At first she woke up with it feeling like it was dislocated -then popped back into place  Started to happen on and off more often   Stiffness in thumb- worse in ams  Very painful at times and to pop  Snap in the most distal joint in thumb  Palm under her 1,2nd digit is sore   L hand has similar pain in the same area  Very stiff in am  Thumb triggers very infrequently   Thinks she has some OA in hands  Knuckles are a little bigger No trauma that she knows of     Otc: occ takes aleve for pain  Has meloxicam- is not taking it     Last colonoscopy 2/12 - few polyps    Also has an area on L leg -has a stinging sensation (about the size of her hand)  Gets tender to the touch  Skin looks normal  Outside of outer thigh    Review of Systems     Objective:   Physical Exam        Assessment & Plan:

## 2019-10-15 NOTE — Assessment & Plan Note (Signed)
Bothersome R more than L  Ref to orthopedics May need injection or other intervention

## 2019-10-15 NOTE — Assessment & Plan Note (Signed)
Loose stool intermittently  Enc fiber intake  No infx symptoms  Enc her to alert if not improving  Out of work today

## 2019-10-15 NOTE — Progress Notes (Signed)
Virtual Visit via Telephone Note  I connected with Michele [name] on 10/15/19 at  9:00 AM EDT by telephone and verified that I am speaking with the correct person using two identifiers.  Location: Patient: home Provider: office   I discussed the limitations, risks, security and privacy concerns of performing an evaluation and management service by telephone and the availability of in person appointments. I also discussed with the patient that there may be a patient responsible charge related to this service. The patient expressed understanding and agreed to proceed.  Parties involved in encounter  Patient: Michele [name]  Provider:  Loura Pardon MD     History of Present Illness: Pt presents with trouble in finger (trigger finger) and also diarrhea   Wt Readings from Last 3 Encounters:  08/25/19 208 lb (94.3 kg)  06/13/19 207 lb 5 oz (94 kg)  11/05/18 198 lb 3.2 oz (89.9 kg)   Having a diarrhea spell  Liquid stool  She has this occasionally  Likely IBS Thinks salads make this worse  Rumbling but no pain  Some cramping and urgency of stool  No blood in stool  occ has to take immodium if it lasts more than a day  Had to stay out of work today   R thumb is triggering  At first she woke up with it feeling like it was dislocated -then popped back into place  Started to happen on and off more often   Stiffness in thumb- worse in ams  Very painful at times and to pop  Snap in the most distal joint in thumb  Palm under her 1,2nd digit is sore   L hand has similar pain in the same area  Very stiff in am  Thumb triggers very infrequently   Thinks she has some OA in hands  Knuckles are a little bigger No trauma that she knows of     Otc: occ takes aleve for pain  Has meloxicam- is not taking it   Last colonoscopy 2/12 - few polyps    Also has an area on L leg -has a stinging sensation (about the size of her hand)  Gets tender to the touch  Skin looks normal  Outside of  outer thigh   Patient Active Problem List   Diagnosis Date Noted  . Trigger finger of right thumb 10/15/2019  . Meralgia paresthetica 10/15/2019  . Right shoulder pain 08/25/2019  . Bipolar 1 disorder, mixed, moderate ([place]) 08/25/2019  . Family history of DVT 08/25/2019  . Left facial swelling 06/13/2019  . BPV (benign positional vertigo) 11/04/2018  . 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 Palpitations 08/27/2018  . Precordial chest pain 08/27/2018  . Shortness of breath on exertion 08/27/2018  . Impetigo 08/27/2018  . Screen for STD (sexually transmitted disease) 04/04/2018  . Allergic rhinitis 01/10/2017  . RUQ abdominal pain 04/04/2016  . Adjustment reaction with anxious mood 08/18/2015  . Arm paresthesia, right 07/14/2015  . Diarrhea 10/13/2014  . Rectal pressure 10/01/2013  . Rectocele 10/01/2013  . Skin tag 04/23/2013  . Mixed incontinence 04/23/2013  . Encounter for routine gynecological examination 04/01/2012  . Prediabetes 04/01/2012  . Routine general medical examination at a health care facility 03/11/2012  . GERD 08/26/2010  . History of anemia 07/19/2010  . FREQUENCY, URINARY 07/06/2010  . MICROSCOPIC HEMATURIA 06/09/2010  . History of colonic polyps 11/15/2007  . IBS 10/08/2007  . ENDOMETRIOSIS 10/08/2007  . Mixed hyperlipidemia 10/30/2006  . Anxiety and depression 10/30/2006  .  HEMORRHOIDS, INTERNAL 10/30/2006  . Asthma 10/30/2006  . MIGRAINES, HX OF 10/30/2006   Past Medical History:  Diagnosis Date  . Abnormal Pap smear of cervix    cryotx  . Anxiety   . Arthritis   . Asthma   . Depression   . Endometriosis   . Fatty liver   . Gastric polyp   . GERD (gastroesophageal reflux disease)   . Hemorrhoids   . Hiatal hernia   . HLD (hyperlipidemia)   . Hyperplastic colon polyp   . IBS (irritable bowel syndrome)   . Iron deficiency anemia 1/12  . Pneumonia   . Rectocele   . Rotator cuff tear, left 2009  . Syncope   . Tachycardia    s/p  negative cardiac work up with echo  . Uterine fibroid [id number]   Past Surgical History:  Procedure Laterality Date  . CARPAL TUNNEL RELEASE Bilateral   . COLONOSCOPY  1999   small polyps, hemms  . ESOPHAGOGASTRODUODENOSCOPY  2/12   gastric polyp and HH  . HYSTEROSCOPY    . LAPAROSCOPY  2006   endometriosis  . ROTATOR CUFF REPAIR Left    Social History   Tobacco Use  . Smoking status: Former Smoker    Packs/day: 0.25    Years: 5.00    Pack years: 1.25    Types: Cigarettes    Quit date: 07/03/1992    Years since quitting: 27.3  . Smokeless tobacco: Never Used  Substance Use Topics  . Alcohol use: Yes    Alcohol/week: 0.0 standard drinks    Comment: Rarely, once every few months  . Drug use: No   Family History  Problem Relation Age of Onset  . Hypertension Father   . Diabetes Father   . Stroke Father   . Migraines Father   . Barrett's esophagus Father   . Anemia Father        iron def  . Heart disease Father        pacemaker, a fib, CHF  . Bipolar disorder Father   . Dementia Mother   . Allergic rhinitis Mother   . Diabetes Sister   . Hyperlipidemia Sister   . Deep vein thrombosis Sister   . Anxiety disorder Daughter   . Bipolar disorder Daughter   . ADD / ADHD Daughter   . Neuropathy Daughter   . Syncope episode Daughter   . Diabetes Paternal Grandfather   . COPD Paternal Grandmother   . Breast cancer Paternal Grandmother   . Angioedema Neg Hx   . Asthma Neg Hx   . Atopy Neg Hx   . Eczema Neg Hx   . Immunodeficiency Neg Hx   . Urticaria Neg Hx    Allergies  Allergen Reactions  . Hydrocodone-Acetaminophen Itching  . Povidone-Iodine Rash       . Propoxyphene N-Acetaminophen Itching        Current Outpatient Medications on File Prior to Visit  Medication Sig Dispense Refill  . albuterol (VENTOLIN HFA) 108 (90 Base) MCG/ACT inhaler INHALE 2 PUFFS INTO THE LUNGS EVERY 4 HOURS AS NEEDED FOR WHEEZE 8.5 g 1  . ALPRAZolam (XANAX) 0.5 MG tablet Take 0.5-1  tablets (0.25-0.5 mg total) by mouth 2 (two) times daily as needed for anxiety. 60 tablet 0  . aspirin EC 81 MG tablet Take 81 mg by mouth daily.    Marland Kitchen atorvastatin (LIPITOR) 80 MG tablet TAKE 1 TABLET BY MOUTH EVERY DAY 90 tablet 1  . Biotin w/ Vitamins C &  E (HAIR/SKIN/NAILS PO) Take 3 tablets by mouth daily.    . busPIRone (BUSPAR) 30 MG tablet Take 1 tablet (30 mg total) by mouth 2 (two) times daily. 180 tablet 0  . CALCIUM PO Take by mouth.    . diphenhydrAMINE (BENADRYL) 25 MG tablet Take 75 mg by mouth at bedtime as needed for sleep.    Marland Kitchen esomeprazole (NEXIUM) 40 MG capsule TAKE 1 CAPSULE BY MOUTH EVERY DAY IN THE MORNING 90 capsule 1  . fluticasone (FLONASE) 50 MCG/ACT nasal spray Place 2 sprays into both nostrils as needed. 16 g 11  . lamoTRIgine (LAMICTAL) 150 MG tablet Take 1 tablet (150 mg total) by mouth daily. 90 tablet 1  . meclizine (ANTIVERT) 25 MG tablet Take 1 tablet (25 mg total) by mouth 3 (three) times daily as needed for dizziness or nausea. 30 tablet 0  . meloxicam (MOBIC) 15 MG tablet Take 1 tablet (15 mg total) by mouth daily as needed for pain. With food 30 tablet 1  . metoprolol tartrate (LOPRESSOR) 25 MG tablet TAKE 1 TABLET BY MOUTH TWICE A DAY 180 tablet 0  . montelukast (SINGULAIR) 10 MG tablet TAKE 1 TABLET BY MOUTH EVERYDAY AT BEDTIME 90 tablet 3  . naproxen sodium (ALEVE) 220 MG tablet Take 440 mg by mouth daily as needed (pain).    Marland Kitchen oxcarbazepine (TRILEPTAL) 600 MG tablet Take 1 tablet (600 mg total) by mouth 2 (two) times daily. 180 tablet 0  . Prenatal Vit-Fe Fumarate-FA (PRENATAL PO) Take 1 tablet by mouth daily.    . vitamin C (ASCORBIC ACID) 500 MG tablet Take 500 mg by mouth daily.    . vitamin E (VITAMIN E) 400 UNIT capsule Take 2 capsules (800 Units total) by mouth daily. 60 capsule 5  . [DISCONTINUED] norethindrone-ethinyl estradiol-iron (MICROGESTIN FE1.5/30) 1.5-30 MG-MCG tablet Take 1 tablet by mouth daily.       No current facility-administered  medications on file prior to visit.   Review of Systems  Constitutional: Negative for chills, fever and malaise/fatigue.  HENT: Negative for congestion, ear pain, sinus pain and sore throat.   Eyes: Negative for blurred vision, discharge and redness.  Respiratory: Negative for cough, shortness of breath and stridor.   Cardiovascular: Negative for chest pain, palpitations and leg swelling.  Gastrointestinal: Positive for diarrhea. Negative for abdominal pain, blood in stool, melena, nausea and vomiting.  Musculoskeletal: Positive for joint pain. Negative for myalgias.       Triggering of thumbs  Skin: Negative for rash.  Neurological: Positive for sensory change. Negative for dizziness and headaches.      Observations/Objective: Pt sounds well  Nl affect/ mood is cheerful Nl cognition- good historian  Voice is baseline    Assessment and Plan: Problem List Items Addressed This Visit      Digestive   IBS    Loose stool intermittently  Enc fiber intake  No infx symptoms  Enc her to alert if not improving  Out of work today        Nervous and Auditory   Meralgia paresthetica    Suspect due to nerve compression in groin Ice/stretching and wt loss may help  Continue to monitor        Musculoskeletal and Integument   Trigger finger of right thumb - Primary    Bothersome R more than L  Ref to orthopedics May need injection or other intervention       Relevant Orders   Ambulatory referral to Orthopedic Surgery  Follow Up Instructions: I placed an orthopedic referral for the trigger thumb issue and our office will contact you   Try ice on groin area to help the thigh numbness   Eat a bland diet until diarrhea improves and keep [place] posted    I discussed the assessment and treatment plan with the patient. The patient was provided an opportunity to ask questions and all were answered. The patient agreed with the plan and demonstrated an understanding of the  instructions.   The patient was advised to call back or seek an in-person evaluation if the symptoms worsen or if the condition fails to improve as anticipated.  I provided 20 minutes of non-face-to-face time during this encounter.   Loura Pardon, MD

## 2019-10-15 NOTE — Patient Instructions (Signed)
I placed an orthopedic referral for the trigger thumb issue and our office will contact you   Try ice on groin area to help the thigh numbness   Eat a bland diet until diarrhea improves and keep [place] posted

## 2019-10-15 NOTE — Assessment & Plan Note (Signed)
Suspect due to nerve compression in groin Ice/stretching and wt loss may help  Continue to monitor

## 2019-10-20 ENCOUNTER — Ambulatory Visit (INDEPENDENT_AMBULATORY_CARE_PROVIDER_SITE_OTHER): Payer: BC Managed Care – PPO | Admitting: Psychology

## 2019-10-20 ENCOUNTER — Other Ambulatory Visit: Payer: Self-pay

## 2019-10-20 ENCOUNTER — Ambulatory Visit: Payer: BC Managed Care – PPO | Attending: Internal Medicine

## 2019-10-20 DIAGNOSIS — Z23 Encounter for immunization: Secondary | ICD-10-CM

## 2019-10-20 DIAGNOSIS — F411 Generalized anxiety disorder: Secondary | ICD-10-CM | POA: Diagnosis not present

## 2019-10-20 NOTE — Progress Notes (Signed)
   Covid-19 Vaccination Clinic  Name:  Michele [name]    MRN: [id number] DOB: August 25, 1965  10/20/2019  [name] was observed post Covid-19 immunization for 15 minutes without incident. She was provided with Vaccine Information Sheet and instruction to access the V-Safe system.   [name] was instructed to call 911 with any severe reactions post vaccine: Marland Kitchen Difficulty breathing  . Swelling of face and throat  . A fast heartbeat  . A bad rash all over body  . Dizziness and weakness   Immunizations Administered    Name Date Dose VIS Date Route   Pfizer COVID-19 Vaccine 10/20/2019  8:12 AM 0.3 mL 08/27/2018 Intramuscular   Manufacturer: [place]   Lot: [id number]   [place]: [id number]

## 2019-10-26 ENCOUNTER — Other Ambulatory Visit: Payer: Self-pay | Admitting: Physician Assistant

## 2019-10-27 ENCOUNTER — Encounter: Payer: Self-pay | Admitting: Family Medicine

## 2019-10-27 ENCOUNTER — Ambulatory Visit (INDEPENDENT_AMBULATORY_CARE_PROVIDER_SITE_OTHER): Payer: BC Managed Care – PPO | Admitting: Psychology

## 2019-10-27 DIAGNOSIS — F411 Generalized anxiety disorder: Secondary | ICD-10-CM

## 2019-10-29 ENCOUNTER — Encounter: Payer: Self-pay | Admitting: Family Medicine

## 2019-10-29 ENCOUNTER — Other Ambulatory Visit: Payer: Self-pay

## 2019-10-29 ENCOUNTER — Ambulatory Visit: Payer: BC Managed Care – PPO | Admitting: Family Medicine

## 2019-10-29 ENCOUNTER — Other Ambulatory Visit: Payer: Self-pay | Admitting: Physician Assistant

## 2019-10-29 DIAGNOSIS — L237 Allergic contact dermatitis due to plants, except food: Secondary | ICD-10-CM | POA: Insufficient documentation

## 2019-10-29 MED ORDER — PREDNISONE 10 MG PO TABS: ORAL_TABLET | ORAL | 0 refills | Status: DC

## 2019-10-29 NOTE — Assessment & Plan Note (Signed)
In pt who is highly allergic  Counseling given re: avoidance of plant oil  Small area- but this tends to spread fast for her  Prednisone 30 mg taper (finish before 2nd covid vaccine)  Antihistamine prn / otc cortisone prn Update if not starting to improve in a week or if worsening

## 2019-10-29 NOTE — Progress Notes (Signed)
   Subjective:    Patient ID: Michele [name], female    DOB: 22-Jun-1966, 54 y.o.   MRN: [id number]  This visit occurred during the SARS-CoV-2 public health emergency.  Safety protocols were in place, including screening questions prior to the visit, additional usage of staff PPE, and extensive cleaning of exam room while observing appropriate contact time as indicated for disinfecting solutions.    HPI Pt presents for rash - ? Poison ivy  Upper abd and R nipple   Wt Readings from Last 3 Encounters:  10/29/19 203 lb (92.1 kg)  08/25/19 208 lb (94.3 kg)  06/13/19 207 lb 5 oz (94 kg)   30.42 kg/m   Husband was clearing trees on the property - that was last weekend She had contact with him and his clothing   Her rash started 1-2 d after  abd R nipple  Very itchy  Taking oral antihistamines    Had first covid vaccine on 4/19 Next one is 5/12      Review of Systems     Objective:   Physical Exam Constitutional:      General: She is not in acute distress.    Appearance: Normal appearance. She is obese. She is not ill-appearing.  HENT:     Head: Normocephalic and atraumatic.  Eyes:     General:        Right eye: No discharge.        Left eye: No discharge.     Conjunctiva/sclera: Conjunctivae normal.     Pupils: Pupils are equal, round, and reactive to light.  Cardiovascular:     Rate and Rhythm: Regular rhythm. Bradycardia present.  Pulmonary:     Effort: Pulmonary effort is normal.     Breath sounds: No wheezing.  Musculoskeletal:     Cervical back: Neck supple.  Lymphadenopathy:     Cervical: No cervical adenopathy.  Skin:    General: Skin is warm and dry.     Findings: Rash present.     Comments: Vesicular rash with erythema -under R nipple and on lower breast Also mid abdomen- epigastric area  No other rash No excoriations  No pustules   Old bruise on L upper arm from first covid vaccine  Neurological:     Mental Status: She is alert.  Psychiatric:         Mood and Affect: Mood normal.           Assessment & Plan:   Problem List Items Addressed This Visit      Musculoskeletal and Integument   Plant allergic contact dermatitis    In pt who is highly allergic  Counseling given re: avoidance of plant oil  Small area- but this tends to spread fast for her  Prednisone 30 mg taper (finish before 2nd covid vaccine)  Antihistamine prn / otc cortisone prn Update if not starting to improve in a week or if worsening

## 2019-10-29 NOTE — Patient Instructions (Signed)
Make sure anything with the oil of the plant is cleaned  Also avoid the plant itself (do not burn it)   Take the prednisone as directed  Keep rash clean with gentle soap and water  You can use cortisone cream over the counter  Keep cool- hot equals itch    Update if not starting to improve in a week or if worsening

## 2019-11-03 ENCOUNTER — Ambulatory Visit (INDEPENDENT_AMBULATORY_CARE_PROVIDER_SITE_OTHER): Payer: BC Managed Care – PPO | Admitting: Psychology

## 2019-11-03 DIAGNOSIS — F411 Generalized anxiety disorder: Secondary | ICD-10-CM

## 2019-11-10 ENCOUNTER — Ambulatory Visit (INDEPENDENT_AMBULATORY_CARE_PROVIDER_SITE_OTHER): Payer: BC Managed Care – PPO | Admitting: Psychology

## 2019-11-10 DIAGNOSIS — F411 Generalized anxiety disorder: Secondary | ICD-10-CM | POA: Diagnosis not present

## 2019-11-12 ENCOUNTER — Ambulatory Visit: Payer: BC Managed Care – PPO | Attending: Internal Medicine

## 2019-11-12 DIAGNOSIS — Z23 Encounter for immunization: Secondary | ICD-10-CM

## 2019-11-12 NOTE — Progress Notes (Signed)
   Covid-19 Vaccination Clinic  Name:  Michele [name]    MRN: [id number] DOB: 12-17-1965  11/12/2019  Michele [name] was observed post Covid-19 immunization for 15 minutes without incident. She was provided with Vaccine Information Sheet and instruction to access the V-Safe system.   Michele [name] was instructed to call 911 with any severe reactions post vaccine: Marland Kitchen Difficulty breathing  . Swelling of face and throat  . A fast heartbeat  . A bad rash all over body  . Dizziness and weakness   Immunizations Administered    Name Date Dose VIS Date Route   Pfizer COVID-19 Vaccine 11/12/2019  8:14 AM 0.3 mL 08/27/2018 Intramuscular   Manufacturer: [place]   Lot: [id number]   [place]: [id number]

## 2019-11-17 ENCOUNTER — Ambulatory Visit (INDEPENDENT_AMBULATORY_CARE_PROVIDER_SITE_OTHER): Payer: BC Managed Care – PPO | Admitting: Psychology

## 2019-11-17 DIAGNOSIS — F411 Generalized anxiety disorder: Secondary | ICD-10-CM | POA: Diagnosis not present

## 2019-11-18 ENCOUNTER — Other Ambulatory Visit: Payer: Self-pay

## 2019-11-18 ENCOUNTER — Telehealth: Payer: Self-pay | Admitting: Physician Assistant

## 2019-11-18 MED ORDER — LAMOTRIGINE 100 MG PO TABS: 100.0000 mg | ORAL_TABLET | Freq: Two times a day (BID) | ORAL | 0 refills | Status: DC

## 2019-11-18 NOTE — Telephone Encounter (Signed)
Let us increase the Lamictal to 100 mg, 1 p.o. twice daily.  I agree concerning the Xanax.  If she needs more of the Xanax which she probably will, let me know and I will send it in.  She will probably need the Lamictal as well.Please tell her I am sorry that she is going through this and I am thinking about her.

## 2019-11-18 NOTE — Telephone Encounter (Signed)
Patient notified. She does not need any Xanax right now. I will send in an updated Rx for her Lamictal.

## 2019-11-18 NOTE — Telephone Encounter (Signed)
Patient called and said that she is not doing well. She said that her mom has alzheimers and that she only has 1- 3 months to live. She needs an adjustment on her medications and would like a call back at [phone]

## 2019-11-24 ENCOUNTER — Ambulatory Visit (INDEPENDENT_AMBULATORY_CARE_PROVIDER_SITE_OTHER): Payer: BC Managed Care – PPO | Admitting: Psychology

## 2019-11-24 DIAGNOSIS — F411 Generalized anxiety disorder: Secondary | ICD-10-CM

## 2019-11-30 ENCOUNTER — Other Ambulatory Visit: Payer: Self-pay | Admitting: Internal Medicine

## 2019-12-02 ENCOUNTER — Other Ambulatory Visit: Payer: Self-pay | Admitting: Family Medicine

## 2019-12-02 NOTE — Telephone Encounter (Signed)
Pt's had multiple recent acute appts., but no recent or future f/u or CPE, please advise

## 2019-12-08 ENCOUNTER — Ambulatory Visit (INDEPENDENT_AMBULATORY_CARE_PROVIDER_SITE_OTHER): Payer: BC Managed Care – PPO | Admitting: Psychology

## 2019-12-08 DIAGNOSIS — F411 Generalized anxiety disorder: Secondary | ICD-10-CM

## 2019-12-15 ENCOUNTER — Ambulatory Visit: Payer: BC Managed Care – PPO | Admitting: Psychology

## 2019-12-17 ENCOUNTER — Telehealth: Payer: Self-pay | Admitting: Psychiatry

## 2019-12-17 ENCOUNTER — Other Ambulatory Visit: Payer: Self-pay

## 2019-12-17 MED ORDER — BUSPIRONE HCL 30 MG PO TABS: 30.0000 mg | ORAL_TABLET | Freq: Two times a day (BID) | ORAL | 0 refills | Status: DC

## 2019-12-17 NOTE — Telephone Encounter (Signed)
Rx sent 

## 2019-12-17 NOTE — Telephone Encounter (Signed)
Pt states the pharmacy has requested Buspar RF but I don't see one. Please refill her Buspar to the [facility] in [facility] on file.

## 2019-12-22 ENCOUNTER — Ambulatory Visit: Payer: BC Managed Care – PPO | Admitting: Psychology

## 2019-12-26 ENCOUNTER — Ambulatory Visit (INDEPENDENT_AMBULATORY_CARE_PROVIDER_SITE_OTHER): Payer: BC Managed Care – PPO | Admitting: Psychology

## 2019-12-26 DIAGNOSIS — F411 Generalized anxiety disorder: Secondary | ICD-10-CM | POA: Diagnosis not present

## 2019-12-29 ENCOUNTER — Ambulatory Visit: Payer: BC Managed Care – PPO | Admitting: Psychology

## 2020-01-02 ENCOUNTER — Ambulatory Visit: Payer: BC Managed Care – PPO | Admitting: Psychology

## 2020-01-09 ENCOUNTER — Ambulatory Visit (INDEPENDENT_AMBULATORY_CARE_PROVIDER_SITE_OTHER): Payer: BC Managed Care – PPO | Admitting: Psychology

## 2020-01-09 DIAGNOSIS — F411 Generalized anxiety disorder: Secondary | ICD-10-CM | POA: Diagnosis not present

## 2020-01-12 ENCOUNTER — Ambulatory Visit: Payer: BC Managed Care – PPO | Admitting: Psychology

## 2020-01-16 ENCOUNTER — Ambulatory Visit (INDEPENDENT_AMBULATORY_CARE_PROVIDER_SITE_OTHER): Payer: BC Managed Care – PPO | Admitting: Psychology

## 2020-01-16 DIAGNOSIS — F411 Generalized anxiety disorder: Secondary | ICD-10-CM

## 2020-01-19 ENCOUNTER — Ambulatory Visit: Payer: BC Managed Care – PPO | Admitting: Psychology

## 2020-01-20 ENCOUNTER — Other Ambulatory Visit: Payer: Self-pay | Admitting: Physician Assistant

## 2020-01-23 ENCOUNTER — Ambulatory Visit: Payer: BC Managed Care – PPO | Admitting: Psychology

## 2020-01-26 ENCOUNTER — Ambulatory Visit: Payer: BC Managed Care – PPO | Admitting: Psychology

## 2020-01-30 ENCOUNTER — Ambulatory Visit (INDEPENDENT_AMBULATORY_CARE_PROVIDER_SITE_OTHER): Payer: BC Managed Care – PPO | Admitting: Psychology

## 2020-01-30 DIAGNOSIS — F411 Generalized anxiety disorder: Secondary | ICD-10-CM | POA: Diagnosis not present

## 2020-02-02 ENCOUNTER — Other Ambulatory Visit: Payer: Self-pay | Admitting: Internal Medicine

## 2020-02-02 ENCOUNTER — Ambulatory Visit: Payer: BC Managed Care – PPO | Admitting: Psychology

## 2020-02-06 ENCOUNTER — Ambulatory Visit (INDEPENDENT_AMBULATORY_CARE_PROVIDER_SITE_OTHER): Payer: BC Managed Care – PPO | Admitting: Psychology

## 2020-02-06 DIAGNOSIS — F411 Generalized anxiety disorder: Secondary | ICD-10-CM | POA: Diagnosis not present

## 2020-02-09 ENCOUNTER — Ambulatory Visit: Payer: BC Managed Care – PPO | Admitting: Psychology

## 2020-02-10 ENCOUNTER — Other Ambulatory Visit: Payer: Self-pay | Admitting: Physician Assistant

## 2020-02-11 ENCOUNTER — Encounter: Payer: Self-pay | Admitting: Physician Assistant

## 2020-02-11 ENCOUNTER — Telehealth: Payer: Self-pay | Admitting: Physician Assistant

## 2020-02-11 ENCOUNTER — Telehealth (INDEPENDENT_AMBULATORY_CARE_PROVIDER_SITE_OTHER): Payer: BC Managed Care – PPO | Admitting: Physician Assistant

## 2020-02-11 DIAGNOSIS — G4700 Insomnia, unspecified: Secondary | ICD-10-CM

## 2020-02-11 DIAGNOSIS — F411 Generalized anxiety disorder: Secondary | ICD-10-CM | POA: Diagnosis not present

## 2020-02-11 DIAGNOSIS — F319 Bipolar disorder, unspecified: Secondary | ICD-10-CM | POA: Diagnosis not present

## 2020-02-11 DIAGNOSIS — F4321 Adjustment disorder with depressed mood: Secondary | ICD-10-CM

## 2020-02-11 DIAGNOSIS — F4320 Adjustment disorder, unspecified: Secondary | ICD-10-CM

## 2020-02-11 MED ORDER — LAMOTRIGINE 100 MG PO TABS: ORAL_TABLET | ORAL | 0 refills | Status: DC

## 2020-02-11 MED ORDER — BUSPIRONE HCL 30 MG PO TABS: 30.0000 mg | ORAL_TABLET | Freq: Two times a day (BID) | ORAL | 0 refills | Status: DC

## 2020-02-11 NOTE — Progress Notes (Signed)
[facility] Check  Patient ID: Michele [name],  MRN: [id number]  PCP: Abner Greenspan, MD  Date of Evaluation: 02/12/2020 Time spent:30 minutes  Chief Complaint:  Chief Complaint    Anxiety; Depression     Virtual Visit via Telehealth  I connected with patient by a video enabled telemedicine application with their informed consent, and verified patient privacy and that I am speaking with the correct person using two identifiers.  I am private, in my office and the patient is at work.  I discussed the limitations, risks, security and privacy concerns of performing an evaluation and management service by video and the availability of in person appointments. I also discussed with the patient that there may be a patient responsible charge related to this service. The patient expressed understanding and agreed to proceed.   I discussed the assessment and treatment plan with the patient. The patient was provided an opportunity to ask questions and all were answered. The patient agreed with the plan and demonstrated an understanding of the instructions.   The patient was advised to call back or seek an in-person evaluation if the symptoms worsen or if the condition fails to improve as anticipated.  I provided 30 minutes of non-face-to-face time during this encounter.  HISTORY/CURRENT STATUS: HPI for routine med check.  Doing so-so, not taking the Xanax very often.  But still very stressed.  Her parents continue to live w/ her and her husband. Mom is on Hospice now, has dementia. Dad is having heart problems and had ablation last week for A Fib.  She's also dealing with the betrayal of her husband. He's not facing things and won't try.  They do live together and he helps with her parents.   Feels depressed and overwhelmed. Energy and motivation are low but she pushes herself to do the things she has to. Not isolating. Doesn't feel rested when she gets up. Cries sometimes. No  SI/HI.  Patient denies increased energy with decreased need for sleep, no increased talkativeness, no racing thoughts, no impulsivity or risky behaviors, no increased spending, no increased libido, no grandiosity, no increased irritability or anger, and no hallucinations.  Denies dizziness, syncope, seizures, numbness, tingling, tremor, tics, unsteady gait, slurred speech, confusion. Denies muscle or joint pain, stiffness, or dystonia.  Individual Medical History/ Review of Systems: Changes? :No    Past medications for mental health diagnoses include: Buspar, Prozac, Paxil  Allergies: Hydrocodone-acetaminophen, Povidone-iodine, and Propoxyphene n-acetaminophen  Current Medications:  Current Outpatient Medications:  .  albuterol (VENTOLIN HFA) 108 (90 Base) MCG/ACT inhaler, INHALE 2 PUFFS INTO THE LUNGS EVERY 4 HOURS AS NEEDED FOR WHEEZE, Disp: 8.5 g, Rfl: 1 .  ALPRAZolam (XANAX) 0.5 MG tablet, Take 0.5-1 tablets (0.25-0.5 mg total) by mouth 2 (two) times daily as needed for anxiety., Disp: 60 tablet, Rfl: 0 .  aspirin EC 81 MG tablet, Take 81 mg by mouth daily., Disp: , Rfl:  .  atorvastatin (LIPITOR) 80 MG tablet, TAKE 1 TABLET BY MOUTH EVERY DAY, Disp: 90 tablet, Rfl: 1 .  Biotin w/ Vitamins C & E (HAIR/SKIN/NAILS PO), Take 3 tablets by mouth daily., Disp: , Rfl:  .  busPIRone (BUSPAR) 30 MG tablet, Take 1 tablet (30 mg total) by mouth 2 (two) times daily., Disp: 180 tablet, Rfl: 0 .  CALCIUM PO, Take by mouth., Disp: , Rfl:  .  cholecalciferol (VITAMIN D3) 25 MCG (1000 UNIT) tablet, Take 1,000 Units by mouth daily., Disp: , Rfl:  .  esomeprazole ([place])  40 MG capsule, TAKE 1 CAPSULE BY MOUTH EVERY DAY IN THE MORNING, Disp: 90 capsule, Rfl: 1 .  fluticasone (FLONASE) 50 MCG/ACT nasal spray, Place 2 sprays into both nostrils as needed., Disp: 16 g, Rfl: 11 .  lamoTRIgine (LAMICTAL) 100 MG tablet, 1.5 pills q am, and 1 po qhs., Disp: 135 tablet, Rfl: 0 .  meclizine (ANTIVERT) 25 MG tablet,  Take 1 tablet (25 mg total) by mouth 3 (three) times daily as needed for dizziness or nausea., Disp: 30 tablet, Rfl: 0 .  meloxicam (MOBIC) 15 MG tablet, Take 1 tablet (15 mg total) by mouth daily as needed for pain. With food, Disp: 30 tablet, Rfl: 1 .  metoprolol tartrate (LOPRESSOR) 25 MG tablet, TAKE 1 TABLET BY MOUTH TWICE A DAY, Disp: 180 tablet, Rfl: 0 .  montelukast (SINGULAIR) 10 MG tablet, TAKE 1 TABLET BY MOUTH EVERYDAY AT BEDTIME, Disp: 90 tablet, Rfl: 3 .  naproxen sodium (ALEVE) 220 MG tablet, Take 440 mg by mouth daily as needed (pain)., Disp: , Rfl:  .  oxcarbazepine (TRILEPTAL) 600 MG tablet, TAKE 1 TABLET BY MOUTH TWICE A DAY, Disp: 180 tablet, Rfl: 0 .  vitamin C (ASCORBIC ACID) 500 MG tablet, Take 500 mg by mouth daily., Disp: , Rfl:  .  vitamin E (VITAMIN E) 400 UNIT capsule, Take 2 capsules (800 Units total) by mouth daily., Disp: 60 capsule, Rfl: 5 .  zinc gluconate 50 MG tablet, Take 50 mg by mouth daily., Disp: , Rfl:  .  diphenhydrAMINE (BENADRYL) 25 MG tablet, Take 75 mg by mouth at bedtime as needed for sleep. (Patient not taking: Reported on 02/11/2020), Disp: , Rfl:  .  predniSONE (DELTASONE) 10 MG tablet, Take 3 pills once daily by mouth for 3 days, then 2 pills once daily for 3 days, then 1 pill once daily for 3 days and then stop (Patient not taking: Reported on 02/11/2020), Disp: 18 tablet, Rfl: 0 .  Prenatal Vit-Fe Fumarate-FA (PRENATAL PO), Take 1 tablet by mouth daily., Disp: , Rfl:  Medication Side Effects: none  Family Medical/ Social History: Changes? No  MENTAL HEALTH EXAM:  Last menstrual period 03/06/2014.There is no height or weight on file to calculate BMI.  General Appearance: unable to assess  Eye Contact:  unable to assess  Speech:  Clear and Coherent and Normal Rate  Volume:  Normal  Mood:  Euthymic  Affect:  Appropriate  Thought Process:  Goal Directed and Descriptions of Associations: Intact  Orientation:  Full (Time, Place, and Person)   Thought Content: Logical   Suicidal Thoughts:  No  Homicidal Thoughts:  No  Memory:  WNL  Judgement:  Good  Insight:  Good  Psychomotor Activity:  unable to assess  Concentration:  Concentration: Good  Recall:  Good  Fund of Knowledge: Good  Language: Good  Assets:  Desire for Improvement  ADL's:  Intact  Cognition: WNL  Prognosis:  Good    DIAGNOSES:    ICD-10-CM   1. Situational depression  F43.21   2. Insomnia, unspecified type  G47.00   3. Generalized anxiety disorder  F41.1   4. Bipolar I disorder ([place])  F31.9   5. Anticipatory grief  F43.20     Receiving Psychotherapy: Yes    RECOMMENDATIONS:  PDMP was reviewed. I provided 30 mins of non- face to face time during encounter. Discussed the depression. Some of it is certainly situational but I believe increasing the Lamictal would help. Increase Lamictal 100 mg to 1.5 pill  q am, 1 qhs. Continue Xanax 0.5 mg, 1/2-1 p.o. twice daily as needed. She takes very rarely. It may help w/ sleep so recommend it a few hours before bed  Continue BuSpar 30 mg, 1 p.o. twice daily. Continue oxcarbazepine 600 mg, 1 p.o. twice daily. Continue counseling. Return in 4 wks.  Donnal Moat, PA-C

## 2020-02-11 NOTE — Telephone Encounter (Signed)
Ms. lexys, milliner are scheduled for a virtual visit with your provider today.    Just as we do with appointments in the office, we must obtain your consent to participate.  Your consent will be active for this visit and any virtual visit you may have with one of our providers in the next 365 days.    If you have a MyChart account, I can also send a copy of this consent to you electronically.  All virtual visits are billed to your insurance company just like a traditional visit in the office.  As this is a virtual visit, video technology does not allow for your provider to perform a traditional examination.  This may limit your provider's ability to fully assess your condition.  If your provider identifies any concerns that need to be evaluated in person or the need to arrange testing such as labs, EKG, etc, we will make arrangements to do so.    Although advances in technology are sophisticated, we cannot ensure that it will always work on either your end or our end.  If the connection with a video visit is poor, we may have to switch to a telephone visit.  With either a video or telephone visit, we are not always able to ensure that we have a secure connection.   I need to obtain your verbal consent now.   Are you willing to proceed with your visit today?   Vincy Meetze has provided verbal consent on 02/11/2020 for a virtual visit (video or telephone).   Donnal Moat, PA-C 02/11/2020  11:43 AM

## 2020-02-13 ENCOUNTER — Encounter: Payer: Self-pay | Admitting: Family Medicine

## 2020-02-13 ENCOUNTER — Ambulatory Visit: Payer: BC Managed Care – PPO | Admitting: Psychology

## 2020-02-16 ENCOUNTER — Ambulatory Visit: Payer: BC Managed Care – PPO | Admitting: Psychology

## 2020-02-20 ENCOUNTER — Ambulatory Visit: Payer: BC Managed Care – PPO | Admitting: Psychology

## 2020-02-23 ENCOUNTER — Ambulatory Visit: Payer: BC Managed Care – PPO | Admitting: Psychology

## 2020-02-24 ENCOUNTER — Telehealth: Payer: Self-pay | Admitting: Physician Assistant

## 2020-02-24 NOTE — Telephone Encounter (Signed)
Noted  

## 2020-02-24 NOTE — Telephone Encounter (Signed)
Michele [name] called to report since the increase of dose of Lamictal to some in the morning and some in the evening she is experiencing nausea,, dizziness, and very wobbly after the morning dose.  Michele [name] would like to know if she can stop taking it in the morning and take the full dose at night so she is sleeping when she has the side effects.  Or perhaps there is another solution.  Please call to discuss

## 2020-02-24 NOTE — Telephone Encounter (Signed)
Michele [name] please let her know it is fine to take the entire dose of Lamictal in the evening.

## 2020-02-24 NOTE — Telephone Encounter (Signed)
Pt. Made aware.

## 2020-02-26 ENCOUNTER — Encounter: Payer: BC Managed Care – PPO | Admitting: Family Medicine

## 2020-02-27 ENCOUNTER — Ambulatory Visit: Payer: BC Managed Care – PPO | Admitting: Psychology

## 2020-03-01 ENCOUNTER — Ambulatory Visit: Payer: BC Managed Care – PPO | Admitting: Psychology

## 2020-03-03 ENCOUNTER — Encounter: Payer: Self-pay | Admitting: Family Medicine

## 2020-03-04 MED ORDER — FLUCONAZOLE 150 MG PO TABS
150.0000 mg | ORAL_TABLET | Freq: Once | ORAL | 0 refills | Status: AC
Start: 2020-03-04 — End: 2020-03-04

## 2020-03-05 ENCOUNTER — Ambulatory Visit: Payer: BC Managed Care – PPO | Admitting: Psychology

## 2020-03-12 ENCOUNTER — Ambulatory Visit (INDEPENDENT_AMBULATORY_CARE_PROVIDER_SITE_OTHER): Payer: BC Managed Care – PPO | Admitting: Psychology

## 2020-03-12 ENCOUNTER — Ambulatory Visit: Payer: BC Managed Care – PPO | Admitting: Psychology

## 2020-03-12 DIAGNOSIS — F411 Generalized anxiety disorder: Secondary | ICD-10-CM

## 2020-03-17 ENCOUNTER — Ambulatory Visit (INDEPENDENT_AMBULATORY_CARE_PROVIDER_SITE_OTHER): Payer: BC Managed Care – PPO | Admitting: Psychology

## 2020-03-17 DIAGNOSIS — F411 Generalized anxiety disorder: Secondary | ICD-10-CM

## 2020-03-23 ENCOUNTER — Ambulatory Visit (INDEPENDENT_AMBULATORY_CARE_PROVIDER_SITE_OTHER): Payer: BC Managed Care – PPO | Admitting: Psychology

## 2020-03-23 DIAGNOSIS — F411 Generalized anxiety disorder: Secondary | ICD-10-CM

## 2020-03-25 ENCOUNTER — Ambulatory Visit (INDEPENDENT_AMBULATORY_CARE_PROVIDER_SITE_OTHER): Payer: BC Managed Care – PPO | Admitting: Family Medicine

## 2020-03-25 ENCOUNTER — Other Ambulatory Visit: Payer: Self-pay

## 2020-03-25 ENCOUNTER — Other Ambulatory Visit (HOSPITAL_COMMUNITY)
Admission: RE | Admit: 2020-03-25 | Discharge: 2020-03-25 | Disposition: A | Discharge status: Home / Self Care | Payer: BC Managed Care – PPO | Source: Ambulatory Visit | Attending: Family Medicine | Admitting: Family Medicine

## 2020-03-25 ENCOUNTER — Encounter: Payer: Self-pay | Admitting: Family Medicine

## 2020-03-25 VITALS — BP 132/77 | HR 69 | Ht 68.0 in | Wt 210.4 lb

## 2020-03-25 DIAGNOSIS — Z23 Encounter for immunization: Secondary | ICD-10-CM | POA: Diagnosis not present

## 2020-03-25 DIAGNOSIS — Z124 Encounter for screening for malignant neoplasm of cervix: Secondary | ICD-10-CM

## 2020-03-25 DIAGNOSIS — Z113 Encounter for screening for infections with a predominantly sexual mode of transmission: Secondary | ICD-10-CM

## 2020-03-25 DIAGNOSIS — Z01419 Encounter for gynecological examination (general) (routine) without abnormal findings: Secondary | ICD-10-CM

## 2020-03-25 NOTE — Progress Notes (Signed)
  Subjective:     Michele [name] is a 54 y.o. female and is here for a comprehensive physical exam. The patient reports no problems. Has some incontinence of urine. Both urge and stress. 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 No cycle for some time.    The following portions of the patient's history were reviewed and updated as appropriate: allergies, current medications, past family history, past medical history, past social history, past surgical history and problem list.  Review of Systems Pertinent items noted in HPI and remainder of comprehensive ROS otherwise negative.   Objective:    BP 132/77   Pulse 69   Ht 5\' 8"  (1.727 m)   Wt 210 lb 6.4 oz (95.4 kg)   LMP 03/06/2014   BMI 31.99 kg/m  General appearance: alert, cooperative and appears stated age Head: Normocephalic, without obvious abnormality, atraumatic Neck: no adenopathy, supple, symmetrical, trachea midline and thyroid not enlarged, symmetric, no tenderness/mass/nodules Lungs: clear to auscultation bilaterally Breasts: normal appearance, no masses or tenderness Heart: regular rate and rhythm, S1, S2 normal, no murmur, click, rub or gallop Abdomen: soft, non-tender; bowel sounds normal; no masses,  no organomegaly Pelvic: cervix normal in appearance, external genitalia normal, no adnexal masses or tenderness, no cervical motion tenderness, uterus normal size, shape, and consistency and mild cystocele Extremities: extremities normal, atraumatic, no cyanosis or edema Pulses: 2+ and symmetric Skin: Skin color, texture, turgor normal. No rashes or lesions Lymph nodes: Cervical, supraclavicular, and axillary nodes normal. Neurologic: Grossly normal    Assessment:    GYN female exam.      Plan:     Screen for STD (sexually transmitted disease) - Plan: Hepatitis B surface antigen, Hepatitis C antibody, HIV Antibody (routine testing w rflx), RPR  Screening for malignant neoplasm of cervix - Plan: Cytology - PAP  Encounter for gynecological  examination without abnormal finding  Need for immunization against influenza - Plan: Flu Vaccine QUAD 36+ mos IM (Fluarix, Quad PF)  Return in 1 year (on 03/25/2021).  See After Visit Summary for Counseling Recommendations

## 2020-03-26 LAB — RPR: RPR Ser Ql: NONREACTIVE

## 2020-03-26 LAB — HEPATITIS C ANTIBODY: Hep C Virus Ab: <0.1 s/co ratio (ref 0.0–0.9)

## 2020-03-26 LAB — HEPATITIS B SURFACE ANTIGEN: Hepatitis B Surface Ag: NEGATIVE

## 2020-03-26 LAB — HIV ANTIBODY (ROUTINE TESTING W REFLEX): HIV Screen 4th Generation wRfx: NONREACTIVE

## 2020-03-30 LAB — CYTOLOGY - PAP
Adequacy: ABSENT
Chlamydia: NEGATIVE
Comment: NEGATIVE
Comment: NEGATIVE
Comment: NEGATIVE
Comment: NORMAL
Diagnosis: NEGATIVE
High risk HPV: NEGATIVE
Neisseria Gonorrhea: NEGATIVE
Trichomonas: NEGATIVE

## 2020-04-06 ENCOUNTER — Ambulatory Visit (INDEPENDENT_AMBULATORY_CARE_PROVIDER_SITE_OTHER): Payer: BC Managed Care – PPO | Admitting: Psychology

## 2020-04-06 DIAGNOSIS — F411 Generalized anxiety disorder: Secondary | ICD-10-CM | POA: Diagnosis not present

## 2020-04-14 ENCOUNTER — Other Ambulatory Visit: Payer: Self-pay | Admitting: Internal Medicine

## 2020-04-14 ENCOUNTER — Other Ambulatory Visit: Payer: Self-pay | Admitting: Physician Assistant

## 2020-04-14 NOTE — Telephone Encounter (Signed)
Rx request sent to pharmacy.  

## 2020-04-15 NOTE — Telephone Encounter (Signed)
Please clarify, total of 2.5 tablets daily? Quantity is #135?

## 2020-04-17 ENCOUNTER — Other Ambulatory Visit: Payer: Self-pay | Admitting: Physician Assistant

## 2020-04-19 ENCOUNTER — Ambulatory Visit: Payer: BC Managed Care – PPO | Admitting: Psychology

## 2020-04-20 ENCOUNTER — Ambulatory Visit (INDEPENDENT_AMBULATORY_CARE_PROVIDER_SITE_OTHER): Payer: BC Managed Care – PPO | Admitting: Psychology

## 2020-04-20 DIAGNOSIS — F411 Generalized anxiety disorder: Secondary | ICD-10-CM

## 2020-05-04 ENCOUNTER — Ambulatory Visit (INDEPENDENT_AMBULATORY_CARE_PROVIDER_SITE_OTHER): Payer: BC Managed Care – PPO | Admitting: Psychology

## 2020-05-04 DIAGNOSIS — F411 Generalized anxiety disorder: Secondary | ICD-10-CM | POA: Diagnosis not present

## 2020-05-13 ENCOUNTER — Other Ambulatory Visit: Payer: Self-pay | Admitting: Internal Medicine

## 2020-05-13 ENCOUNTER — Other Ambulatory Visit: Payer: Self-pay | Admitting: Physician Assistant

## 2020-05-17 NOTE — Telephone Encounter (Signed)
Last apt 08/11, due back at 4 weeks

## 2020-05-18 ENCOUNTER — Ambulatory Visit (INDEPENDENT_AMBULATORY_CARE_PROVIDER_SITE_OTHER): Payer: BC Managed Care – PPO | Admitting: Psychology

## 2020-05-18 DIAGNOSIS — F411 Generalized anxiety disorder: Secondary | ICD-10-CM

## 2020-05-28 ENCOUNTER — Other Ambulatory Visit: Payer: Self-pay | Admitting: Internal Medicine

## 2020-05-28 ENCOUNTER — Other Ambulatory Visit: Payer: Self-pay | Admitting: Family Medicine

## 2020-05-28 ENCOUNTER — Other Ambulatory Visit: Payer: Self-pay | Admitting: Physician Assistant

## 2020-05-28 DIAGNOSIS — J4520 Mild intermittent asthma, uncomplicated: Secondary | ICD-10-CM

## 2020-05-30 NOTE — Telephone Encounter (Signed)
Last apt 08/11 was due back at 4 weeks

## 2020-05-31 NOTE — Telephone Encounter (Signed)
Pt's had multiple acute appts. But no recent or future f/u or CPE, will route to PCP for review

## 2020-06-01 ENCOUNTER — Ambulatory Visit: Payer: BC Managed Care – PPO | Admitting: Psychology

## 2020-06-11 ENCOUNTER — Other Ambulatory Visit: Payer: Self-pay | Admitting: Internal Medicine

## 2020-06-15 ENCOUNTER — Ambulatory Visit (INDEPENDENT_AMBULATORY_CARE_PROVIDER_SITE_OTHER): Payer: BC Managed Care – PPO | Admitting: Psychology

## 2020-06-15 DIAGNOSIS — F411 Generalized anxiety disorder: Secondary | ICD-10-CM

## 2020-06-21 ENCOUNTER — Other Ambulatory Visit: Payer: BC Managed Care – PPO

## 2020-06-21 DIAGNOSIS — Z20822 Contact with and (suspected) exposure to covid-19: Secondary | ICD-10-CM

## 2020-06-23 ENCOUNTER — Other Ambulatory Visit: Payer: Self-pay

## 2020-06-23 ENCOUNTER — Ambulatory Visit
Admission: EM | Admit: 2020-06-23 | Discharge: 2020-06-23 | Disposition: A | Discharge status: Home / Self Care | Payer: BC Managed Care – PPO | Attending: Emergency Medicine | Admitting: Emergency Medicine

## 2020-06-23 ENCOUNTER — Other Ambulatory Visit: Payer: Self-pay | Admitting: Family Medicine

## 2020-06-23 DIAGNOSIS — Z1152 Encounter for screening for COVID-19: Secondary | ICD-10-CM | POA: Diagnosis not present

## 2020-06-23 DIAGNOSIS — Z20822 Contact with and (suspected) exposure to covid-19: Secondary | ICD-10-CM

## 2020-06-23 DIAGNOSIS — J069 Acute upper respiratory infection, unspecified: Secondary | ICD-10-CM

## 2020-06-23 MED ORDER — BENZONATATE 100 MG PO CAPS: 100.0000 mg | ORAL_CAPSULE | Freq: Three times a day (TID) | ORAL | 0 refills | Status: DC | PRN

## 2020-06-23 MED ORDER — FLUTICASONE PROPIONATE 50 MCG/ACT NA SUSP: 1.0000 | Freq: Every day | NASAL | 0 refills | Status: AC

## 2020-06-23 MED ORDER — DEXAMETHASONE 4 MG PO TABS: 4.0000 mg | ORAL_TABLET | Freq: Every day | ORAL | 0 refills | Status: AC

## 2020-06-23 MED ORDER — CETIRIZINE HCL 10 MG PO TABS: 10.0000 mg | ORAL_TABLET | Freq: Every day | ORAL | 0 refills | Status: DC

## 2020-06-23 NOTE — ED Triage Notes (Signed)
Pt presents with c/o cough that began 2 days ago, awaiting covid test. Husband is positive

## 2020-06-23 NOTE — Discharge Instructions (Addendum)
COVID testing ordered.  It will take between 2-7 days for test results.  Someone will contact you regarding abnormal results.    In the meantime: You should remain isolated in your home for 10 days from symptom onset AND greater than 24 hours after symptoms resolution (absence of fever without the use of fever-reducing medication and improvement in respiratory symptoms), whichever is longer Get plenty of rest and push fluids Tessalon Perles prescribed for cough Zyrtec for nasal congestion, runny nose, and/or sore throat Flonase for nasal congestion and runny nose Decadron was prescribed Continue to take albuterol as prescribed and directed by PCP Use medications daily for symptom relief Use OTC medications like ibuprofen or tylenol as needed fever or pain Call or go to the ED if you have any new or worsening symptoms such as fever, worsening cough, shortness of breath, chest tightness, chest pain, turning blue, changes in mental status, etc..Marland Kitchen
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
## 2020-06-24 LAB — NOVEL CORONAVIRUS, NAA: SARS-CoV-2, NAA: NOT DETECTED

## 2020-06-29 ENCOUNTER — Ambulatory Visit (INDEPENDENT_AMBULATORY_CARE_PROVIDER_SITE_OTHER): Payer: BC Managed Care – PPO | Admitting: Psychology

## 2020-06-29 DIAGNOSIS — F411 Generalized anxiety disorder: Secondary | ICD-10-CM

## 2020-06-30 ENCOUNTER — Telehealth (INDEPENDENT_AMBULATORY_CARE_PROVIDER_SITE_OTHER): Payer: BC Managed Care – PPO | Admitting: Physician Assistant

## 2020-06-30 ENCOUNTER — Encounter: Payer: Self-pay | Admitting: Physician Assistant

## 2020-06-30 DIAGNOSIS — F4321 Adjustment disorder with depressed mood: Secondary | ICD-10-CM | POA: Diagnosis not present

## 2020-06-30 DIAGNOSIS — F411 Generalized anxiety disorder: Secondary | ICD-10-CM | POA: Diagnosis not present

## 2020-06-30 DIAGNOSIS — F319 Bipolar disorder, unspecified: Secondary | ICD-10-CM

## 2020-06-30 MED ORDER — OXCARBAZEPINE 600 MG PO TABS: 600.0000 mg | ORAL_TABLET | Freq: Two times a day (BID) | ORAL | 1 refills | Status: DC

## 2020-06-30 MED ORDER — BUSPIRONE HCL 30 MG PO TABS: 30.0000 mg | ORAL_TABLET | Freq: Two times a day (BID) | ORAL | 1 refills | Status: DC

## 2020-06-30 MED ORDER — ALPRAZOLAM 0.5 MG PO TABS: 0.2500 mg | ORAL_TABLET | Freq: Two times a day (BID) | ORAL | 5 refills | Status: DC | PRN

## 2020-06-30 MED ORDER — LAMOTRIGINE 100 MG PO TABS: ORAL_TABLET | ORAL | 1 refills | Status: DC

## 2020-06-30 NOTE — Progress Notes (Signed)
[facility]  Patient ID: Michele [name],  MRN: [id number]  PCP: Abner Greenspan, MD  Date of Evaluation: 06/30/2020 Time spent:20 minutes   Chief Complaint    Anxiety; Depression; Follow-up     Virtual Visit via Telehealth  I connected with patient by video enabled telemedicine application with their informed consent, and verified patient privacy and that I am speaking with the correct person using two identifiers.  I am private, in my office and the patient is at home.  I discussed the limitations, risks, security and privacy concerns of performing an evaluation and management service by video and the availability of in person appointments. I also discussed with the patient that there may be a patient responsible charge related to this service. The patient expressed understanding and agreed to proceed.   I discussed the assessment and treatment plan with the patient. The patient was provided an opportunity to ask questions and all were answered. The patient agreed with the plan and demonstrated an understanding of the instructions.   The patient was advised to call back or seek an in-person evaluation if the symptoms worsen or if the condition fails to improve as anticipated.  I provided 20 minutes of non-face-to-face time during this encounter.   HISTORY/CURRENT STATUS: HPI for routine med check.  Her Mom died on 06-12-20.  She was on hospice for more than 6 months.  Patient is very sad of course but she states she is doing well.  Her dad still lives with her and her husband.  Her husband has COVID right now and they are all in quarantine.  Her test was negative.  She and her husband are still in marital counseling.  As far as her mental health goes states she is doing well.  She is able to enjoy things.  Energy and motivation are good.  Personal hygiene is normal.  She does not cry easily.  Not isolating.  No wide mood swings.  Sleeps well.  No suicidal or homicidal  thoughts.  She does have anxiety, like something bad is about to happen.  That comes and goes and the Xanax does help.  Not having panic attacks.  Patient denies increased energy with decreased need for sleep, no increased talkativeness, no racing thoughts, no impulsivity or risky behaviors, no increased spending, no increased libido, no grandiosity, no increased irritability or anger, no paranoia, and no hallucinations.  Denies dizziness, syncope, seizures, numbness, tingling, tremor, tics, unsteady gait, slurred speech, confusion. Denies muscle or joint pain, stiffness, or dystonia.  Individual Medical History/ Review of Systems: Changes? :No    Past medications for mental health diagnoses include: Buspar, Prozac, Paxil  Allergies: Hydrocodone-acetaminophen, Povidone-iodine, and Propoxyphene n-acetaminophen  Current Medications:  Current Outpatient Medications:  .  albuterol (VENTOLIN HFA) 108 (90 Base) MCG/ACT inhaler, INHALE 2 PUFFS INTO THE LUNGS EVERY 4 HOURS AS NEEDED FOR WHEEZE, Disp: 8.5 each, Rfl: 0 .  aspirin EC 81 MG tablet, Take 81 mg by mouth daily., Disp: , Rfl:  .  atorvastatin (LIPITOR) 80 MG tablet, TAKE 1 TABLET BY MOUTH EVERY DAY, Disp: 90 tablet, Rfl: 1 .  benzonatate (TESSALON) 100 MG capsule, Take 1 capsule (100 mg total) by mouth 3 (three) times daily as needed for cough., Disp: 30 capsule, Rfl: 0 .  Biotin w/ Vitamins C & E (HAIR/SKIN/NAILS PO), Take 3 tablets by mouth daily., Disp: , Rfl:  .  CALCIUM PO, Take by mouth., Disp: , Rfl:  .  cetirizine (ZYRTEC ALLERGY) 10  MG tablet, Take 1 tablet (10 mg total) by mouth daily., Disp: 30 tablet, Rfl: 0 .  cholecalciferol (VITAMIN D3) 25 MCG (1000 UNIT) tablet, Take 1,000 Units by mouth daily., Disp: , Rfl:  .  esomeprazole (NEXIUM) 40 MG capsule, TAKE 1 CAPSULE BY MOUTH EVERY DAY IN THE MORNING, Disp: 90 capsule, Rfl: 1 .  fluticasone (FLONASE) 50 MCG/ACT nasal spray, Place 1 spray into both nostrils daily for 14 days.,  Disp: 16 g, Rfl: 0 .  meclizine (ANTIVERT) 25 MG tablet, Take 1 tablet (25 mg total) by mouth 3 (three) times daily as needed for dizziness or nausea., Disp: 30 tablet, Rfl: 0 .  meloxicam (MOBIC) 15 MG tablet, Take 1 tablet (15 mg total) by mouth daily as needed for pain. With food, Disp: 30 tablet, Rfl: 1 .  metoprolol tartrate (LOPRESSOR) 25 MG tablet, TAKE 1 TABLET BY MOUTH 2 TIMES DAILY. PLEASE SCHEDULE APPOINTMENT WITH DR. HILTY FOR REFILLS., Disp: 60 tablet, Rfl: 1 .  montelukast (SINGULAIR) 10 MG tablet, TAKE 1 TABLET BY MOUTH EVERYDAY AT BEDTIME, Disp: 90 tablet, Rfl: 1 .  naproxen sodium (ALEVE) 220 MG tablet, Take 440 mg by mouth daily as needed (pain)., Disp: , Rfl:  .  vitamin C (ASCORBIC ACID) 500 MG tablet, Take 500 mg by mouth daily., Disp: , Rfl:  .  vitamin E (VITAMIN E) 400 UNIT capsule, Take 2 capsules (800 Units total) by mouth daily., Disp: 60 capsule, Rfl: 5 .  zinc gluconate 50 MG tablet, Take 50 mg by mouth daily., Disp: , Rfl:  .  ALPRAZolam (XANAX) 0.5 MG tablet, Take 0.5-1 tablets (0.25-0.5 mg total) by mouth 2 (two) times daily as needed for anxiety., Disp: 60 tablet, Rfl: 5 .  busPIRone (BUSPAR) 30 MG tablet, Take 1 tablet (30 mg total) by mouth 2 (two) times daily., Disp: 180 tablet, Rfl: 1 .  dexamethasone (DECADRON) 4 MG tablet, Take 1 tablet (4 mg total) by mouth daily for 7 days. (Patient not taking: Reported on 06/30/2020), Disp: 7 tablet, Rfl: 0 .  diphenhydrAMINE (BENADRYL) 25 MG tablet, Take 75 mg by mouth at bedtime as needed for sleep. (Patient not taking: No sig reported), Disp: , Rfl:  .  lamoTRIgine (LAMICTAL) 100 MG tablet, 2.5 pills qhs, Disp: 225 tablet, Rfl: 1 .  oxcarbazepine (TRILEPTAL) 600 MG tablet, Take 1 tablet (600 mg total) by mouth 2 (two) times daily., Disp: 180 tablet, Rfl: 1 .  predniSONE (DELTASONE) 10 MG tablet, Take 3 pills once daily by mouth for 3 days, then 2 pills once daily for 3 days, then 1 pill once daily for 3 days and then stop  (Patient not taking: No sig reported), Disp: 18 tablet, Rfl: 0 .  Prenatal Vit-Fe Fumarate-FA (PRENATAL PO), Take 1 tablet by mouth daily. (Patient not taking: No sig reported), Disp: , Rfl:  Medication Side Effects: none  Family Medical/ Social History: Changes?  Her mom died 06-13-2020  MENTAL HEALTH EXAM:  Last menstrual period 03/06/2014.There is no height or weight on file to calculate BMI.  General Appearance: Casual  Eye Contact:  Good  Speech:  Clear and Coherent and Normal Rate  Volume:  Normal  Mood:  Euthymic  Affect:  Appropriate  Thought Process:  Goal Directed and Descriptions of Associations: Intact  Orientation:  Full (Time, Place, and Person)  Thought Content: Logical   Suicidal Thoughts:  No  Homicidal Thoughts:  No  Memory:  WNL  Judgement:  Good  Insight:  Good  Psychomotor  Activity:  Normal  Concentration:  Concentration: Good  Recall:  Good  Fund of Knowledge: Good  Language: Good  Assets:  Desire for Improvement  ADL's:  Intact  Cognition: WNL  Prognosis:  Good    DIAGNOSES:    ICD-10-CM   1. Grief  F43.21   2. Bipolar I disorder ([facility])  F31.9   3. Generalized anxiety disorder  F41.1     Receiving Psychotherapy: Yes  For individual counseling, as well as marital counseling for her and her husband.   RECOMMENDATIONS:  PDMP was reviewed. I provided 20 mins of non- face to face time during this encounter, in which I reviewed her chart for labs and discussed her medications.  Mentally, she is stable and does not feel that the medications need to be changed in any way. My condolences and the loss of her mom. Continue Lamictal 100 mg, 2.5 pills nightly. Continue Xanax 0.5 mg, 1/2-1 p.o. twice daily as needed. Continue BuSpar 30 mg, 1 p.o. twice daily. Continue oxcarbazepine 600 mg, 1 p.o. twice daily. Continue counseling. Return in 6 months.  Donnal Moat, PA-C

## 2020-07-09 ENCOUNTER — Other Ambulatory Visit: Payer: Self-pay

## 2020-07-09 DIAGNOSIS — Z20822 Contact with and (suspected) exposure to covid-19: Secondary | ICD-10-CM

## 2020-07-11 ENCOUNTER — Other Ambulatory Visit: Payer: Self-pay | Admitting: Internal Medicine

## 2020-07-12 LAB — NOVEL CORONAVIRUS, NAA: SARS-CoV-2, NAA: NOT DETECTED

## 2020-07-13 ENCOUNTER — Ambulatory Visit: Payer: Self-pay | Admitting: Psychology

## 2020-07-14 ENCOUNTER — Other Ambulatory Visit: Payer: Self-pay | Admitting: Family Medicine

## 2020-07-14 ENCOUNTER — Encounter: Payer: Self-pay | Admitting: Family Medicine

## 2020-07-14 MED ORDER — MUPIROCIN CALCIUM 2 % EX CREA: 1.0000 "application " | TOPICAL_CREAM | Freq: Two times a day (BID) | CUTANEOUS | 1 refills | Status: DC

## 2020-07-14 NOTE — Telephone Encounter (Signed)
Spoke with pt verified pharmacy and sent Rx

## 2020-07-14 NOTE — Telephone Encounter (Signed)
Please verify pharmacy and send it  Is pended

## 2020-07-15 ENCOUNTER — Other Ambulatory Visit: Payer: BC Managed Care – PPO

## 2020-07-15 ENCOUNTER — Telehealth (INDEPENDENT_AMBULATORY_CARE_PROVIDER_SITE_OTHER): Payer: Self-pay | Admitting: Internal Medicine

## 2020-07-15 ENCOUNTER — Encounter: Payer: Self-pay | Admitting: Internal Medicine

## 2020-07-15 VITALS — BP 130/76 | HR 59 | Ht 68.0 in | Wt 200.0 lb

## 2020-07-15 DIAGNOSIS — E782 Mixed hyperlipidemia: Secondary | ICD-10-CM

## 2020-07-15 DIAGNOSIS — R002 Palpitations: Secondary | ICD-10-CM

## 2020-07-15 NOTE — Progress Notes (Signed)
Virtual Visit via Video Note   This visit type was conducted due to national recommendations for restrictions regarding the COVID-19 Pandemic (e.g. social distancing) in an effort to limit this patient's exposure and mitigate transmission in our community.  Due to her co-morbid illnesses, this patient is at least at moderate risk for complications without adequate follow up.  This format is felt to be most appropriate for this patient at this time.  All issues noted in this document were discussed and addressed.  A limited physical exam was performed with this format.  Please refer to the patient's chart for her consent to telehealth for Michele [name].   Evaluation Performed:  Caregility video visit  Date:  07/15/2020   ID:  Michele [name], DOB 1965-10-02, MRN [id number]  Patient Location:  [name] [address]  Provider location:   [address] [place], [place] [address]  PCP:  Abner Greenspan, MD  Cardiologist:  No primary care provider on file. Electrophysiologist:  None   Chief Complaint:  Chest pain  History of Present Illness:    [name] is a 55 y.o. female who presents via audio/video conferencing for a telehealth visit today.  [name] was seen today via video visit for follow-up of chest pain.  She was having symptoms when I saw her in March 2020 concerning for unstable angina.  In addition it was felt that she had dyslipidemia with goal LDL less than 70.  I recommended starting metoprolol tartrate 25 mg twice daily for coronary disease and palpitations as well as to prepare her for a CT coronary angiogram which was also ordered.  Unfortunately the study was not performed due to the coronavirus and has been delayed.  Now at follow-up 2 months later, she reports that her chest pain symptoms have resolved.  Overall she feels fairly well.  Despite this, I feel that she still is at risk for coronary disease and would like to know whether or not  there is underlying coronary heart disease.  07/15/2020  Ms. Faulks was seen today in follow-up.  She denies any new anginal symptoms.  She reports good control of her palpitations on metoprolol.  She says she is due for upcoming lab work and a primary care provider visit.  Last year she had significant improvement in her lipids down to an LDL of 76 from 208 prior.  The patient does not have symptoms concerning for COVID-19 infection (fever, chills, cough, or new SHORTNESS OF BREATH).    Prior CV studies:   The following studies were reviewed today:  Lab work  PMHx:  Past Medical History:  Diagnosis Date  . Abnormal Pap smear of cervix    cryotx  . Anxiety   . Arthritis   . Asthma   . Depression   . Endometriosis   . Fatty liver   . Gastric polyp   . GERD (gastroesophageal reflux disease)   . Hemorrhoids   . Hiatal hernia   . HLD (hyperlipidemia)   . Hyperplastic colon polyp   . IBS (irritable bowel syndrome)   . Iron deficiency anemia 1/12  . Pneumonia   . Rectocele   . Rotator cuff tear, left 2009  . Syncope   . Tachycardia    s/p negative cardiac work up with echo  . Uterine fibroid [id number]    Past Surgical History:  Procedure Laterality Date  . CARPAL TUNNEL RELEASE Bilateral   . COLONOSCOPY  1999  small polyps, hemms  . ESOPHAGOGASTRODUODENOSCOPY  2/12   gastric polyp and HH  . HYSTEROSCOPY    . LAPAROSCOPY  2006   endometriosis  . ROTATOR CUFF REPAIR Left   . TRIGGER FINGER RELEASE      FAMHx:  Family History  Problem Relation Age of Onset  . Hypertension Father   . Diabetes Father   . Stroke Father   . Migraines Father   . Barrett's esophagus Father   . Anemia Father        iron def  . Heart disease Father        pacemaker, a fib, CHF  . Bipolar disorder Father   . Dementia Mother   . Allergic rhinitis Mother   . Diabetes Sister   . Hyperlipidemia Sister   . Deep vein thrombosis Sister   . Anxiety disorder Daughter   . Bipolar disorder  Daughter   . ADD / ADHD Daughter   . Neuropathy Daughter   . Syncope episode Daughter   . Diabetes Paternal Grandfather   . COPD Paternal Grandmother   . Breast cancer Paternal Grandmother   . Angioedema Neg Hx   . Asthma Neg Hx   . Atopy Neg Hx   . Eczema Neg Hx   . Immunodeficiency Neg Hx   . Urticaria Neg Hx     SOCHx:   reports that she quit smoking about 28 years ago. Her smoking use included cigarettes. She has a 1.25 pack-year smoking history. She has never used smokeless tobacco. She reports current alcohol use of about 1.0 standard drink of alcohol per week. She reports that she does not use drugs.  ALLERGIES:  Allergies  Allergen Reactions  . Hydrocodone-Acetaminophen Itching  . Povidone-Iodine Rash       . Propoxyphene N-Acetaminophen Itching         MEDS:  Current Meds  Medication Sig  . albuterol (VENTOLIN HFA) 108 (90 Base) MCG/ACT inhaler INHALE 2 PUFFS INTO THE LUNGS EVERY 4 HOURS AS NEEDED FOR WHEEZE  . ALPRAZolam (XANAX) 0.5 MG tablet Take 0.5-1 tablets (0.25-0.5 mg total) by mouth 2 (two) times daily as needed for anxiety.  Marland Kitchen aspirin EC 81 MG tablet Take 81 mg by mouth daily.  Marland Kitchen atorvastatin (LIPITOR) 80 MG tablet TAKE 1 TABLET BY MOUTH EVERY DAY  . Biotin w/ Vitamins C & E (HAIR/SKIN/NAILS PO) Take 3 tablets by mouth daily.  . busPIRone (BUSPAR) 30 MG tablet Take 1 tablet (30 mg total) by mouth 2 (two) times daily.  Marland Kitchen CALCIUM PO Take by mouth.  . cholecalciferol (VITAMIN D3) 25 MCG (1000 UNIT) tablet Take 1,000 Units by mouth daily.  . diphenhydrAMINE (BENADRYL) 25 MG tablet Take 75 mg by mouth at bedtime as needed for sleep.  Marland Kitchen esomeprazole (NEXIUM) 40 MG capsule TAKE 1 CAPSULE BY MOUTH EVERY DAY IN THE MORNING  . lamoTRIgine (LAMICTAL) 100 MG tablet 2.5 pills qhs  . meclizine (ANTIVERT) 25 MG tablet Take 1 tablet (25 mg total) by mouth 3 (three) times daily as needed for dizziness or nausea.  . meloxicam (MOBIC) 15 MG tablet Take 1 tablet (15 mg  total) by mouth daily as needed for pain. With food  . metoprolol tartrate (LOPRESSOR) 25 MG tablet TAKE 1 TABLET BY MOUTH 2 TIMES DAILY. PLEASE SCHEDULE APPOINTMENT WITH DR. HILTY FOR REFILLS.  Marland Kitchen montelukast (SINGULAIR) 10 MG tablet TAKE 1 TABLET BY MOUTH EVERYDAY AT BEDTIME  . naproxen sodium (ALEVE) 220 MG tablet Take 440 mg by mouth  daily as needed (pain).  Marland Kitchen oxcarbazepine (TRILEPTAL) 600 MG tablet Take 1 tablet (600 mg total) by mouth 2 (two) times daily.  . Prenatal Vit-Fe Fumarate-FA (PRENATAL PO) Take 1 tablet by mouth daily.  . vitamin C (ASCORBIC ACID) 500 MG tablet Take 500 mg by mouth daily.  . vitamin E (VITAMIN E) 400 UNIT capsule Take 2 capsules (800 Units total) by mouth daily.  Marland Kitchen zinc gluconate 50 MG tablet Take 50 mg by mouth daily.  . [DISCONTINUED] benzonatate (TESSALON) 100 MG capsule Take 1 capsule (100 mg total) by mouth 3 (three) times daily as needed for cough.  . [DISCONTINUED] cetirizine (ZYRTEC ALLERGY) 10 MG tablet Take 1 tablet (10 mg total) by mouth daily.  . [DISCONTINUED] mupirocin ointment (BACTROBAN) 2 % Apply topically 2 (two) times daily.  . [DISCONTINUED] predniSONE (DELTASONE) 10 MG tablet Take 3 pills once daily by mouth for 3 days, then 2 pills once daily for 3 days, then 1 pill once daily for 3 days and then stop     ROS: Pertinent items noted in HPI and remainder of comprehensive ROS otherwise negative.  Labs/Other Tests and Data Reviewed:    Recent Labs: No results found for requested labs within last 8760 hours.   Recent Lipid Panel Lab Results  Component Value Date/Time   CHOL 149 11/11/2018 10:04 AM   TRIG 89 11/11/2018 10:04 AM   HDL 55 11/11/2018 10:04 AM   CHOLHDL 2.7 11/11/2018 10:04 AM   CHOLHDL 6 04/04/2018 09:21 AM   LDLCALC 76 11/11/2018 10:04 AM   LDLDIRECT 202.8 03/11/2012 01:53 PM    Wt Readings from Last 3 Encounters:  07/15/20 200 lb (90.7 kg)  03/25/20 210 lb 6.4 oz (95.4 kg)  10/29/19 203 lb (92.1 kg)     Exam:     Vital Signs:  BP 130/76   Pulse (!) 59   Ht 5\' 8"  (1.727 m)   Wt 200 lb (90.7 kg)   LMP 03/06/2014   BMI 30.41 kg/m    General appearance: alert and no distress Lungs: No visual respiratory difficulty Extremities: extremities normal, atraumatic, no cyanosis or edema Skin: Skin color, texture, turgor normal. No rashes or lesions Neurologic: Mental status: Alert, oriented, thought content appropriate Psych: Pleasant  ASSESSMENT & PLAN:    1. Unstable angina 2. Dyslipidemia 3. Palpitations  Ms. Cassaro continues to do well.  She denies any chest pain or worsening shortness of breath.  Her dyslipidemia has been controlled however is due to be reassessed.  She has an appointment coming up with her PCP in the near future.  She reports no significant or persistent palpitations.  We will continue current medications.  Plan follow-up annually or sooner as necessary.  COVID-19 Education: The signs and symptoms of COVID-19 were discussed with the patient and how to seek care for testing (follow up with PCP or arrange E-visit).  The importance of social distancing was discussed today.  Patient Risk:   After full review of this patients clinical status, I feel that they are at least moderate risk at this time.  Time:   Today, I have spent 15 minutes with the patient with telehealth technology discussing chest pain, dyslipidemia and palpitations.     Medication Adjustments/Labs and Tests Ordered: Current medicines are reviewed at length with the patient today.  Concerns regarding medicines are outlined above.   Tests Ordered: No orders of the defined types were placed in this encounter.   Medication Changes: No orders of the defined  types were placed in this encounter.   Disposition:  in 1 year(s)  Pixie Casino, MD, [facility], [place] Director of the Advanced Lipid Disorders &  Cardiovascular Risk Reduction Clinic Diplomate of the American  Board of Clinical Lipidology Attending Cardiologist  Direct Dial: [phone]  Fax: [phone]  Website:  [email or web address]  Pixie Casino, MD  07/15/2020 4:00 PM

## 2020-07-15 NOTE — Patient Instructions (Signed)

## 2020-07-21 ENCOUNTER — Other Ambulatory Visit: Payer: Self-pay | Admitting: Internal Medicine

## 2020-07-27 ENCOUNTER — Ambulatory Visit: Payer: BC Managed Care – PPO | Admitting: Psychology

## 2020-08-06 ENCOUNTER — Other Ambulatory Visit: Payer: Self-pay | Admitting: Internal Medicine

## 2020-08-10 ENCOUNTER — Ambulatory Visit (INDEPENDENT_AMBULATORY_CARE_PROVIDER_SITE_OTHER): Payer: BC Managed Care – PPO | Admitting: Psychology

## 2020-08-10 DIAGNOSIS — F411 Generalized anxiety disorder: Secondary | ICD-10-CM

## 2020-08-15 ENCOUNTER — Other Ambulatory Visit: Payer: Self-pay | Admitting: Internal Medicine

## 2020-08-19 ENCOUNTER — Telehealth: Payer: Self-pay | Admitting: Internal Medicine

## 2020-08-19 MED ORDER — METOPROLOL TARTRATE 25 MG PO TABS: ORAL_TABLET | ORAL | 3 refills | Status: DC

## 2020-08-19 MED ORDER — ATORVASTATIN CALCIUM 80 MG PO TABS: 80.0000 mg | ORAL_TABLET | Freq: Every day | ORAL | 3 refills | Status: DC

## 2020-08-19 NOTE — Telephone Encounter (Signed)
*  STAT* If patient is at the pharmacy, call can be transferred to refill team.   1. Which medications need to be refilled? (please list name of each medication and dose if known)  atorvastatin (LIPITOR) 80 MG tablet metoprolol tartrate (LOPRESSOR) 25 MG tablet  2. Which pharmacy/location (including street and city if local pharmacy) is medication to be sent to? [id number] - [place], [place] - [address]  3. Do they need a 30 day or 90 day supply? 90 day supply  Note from 08/06/20 reads, ".Marland KitchenPLEASE SCHEDULE APPOINTMENT WITH DR. HILTY FOR REFILLS," but the patient had a virtual appointment with Dr. Debara Pickett on 07/15/20. Does the patient need to be seen in office for refills? Looks like on 08/06/20 she was also only given a 15 day supply. Please advise.

## 2020-08-19 NOTE — Telephone Encounter (Signed)
Refills sent to pts pharmacy.

## 2020-08-24 ENCOUNTER — Ambulatory Visit (INDEPENDENT_AMBULATORY_CARE_PROVIDER_SITE_OTHER): Payer: BC Managed Care – PPO | Admitting: Psychology

## 2020-08-24 DIAGNOSIS — F411 Generalized anxiety disorder: Secondary | ICD-10-CM

## 2020-09-07 ENCOUNTER — Ambulatory Visit: Payer: BC Managed Care – PPO | Admitting: Psychology

## 2020-09-10 ENCOUNTER — Ambulatory Visit (INDEPENDENT_AMBULATORY_CARE_PROVIDER_SITE_OTHER): Payer: BC Managed Care – PPO | Admitting: Psychology

## 2020-09-10 DIAGNOSIS — F411 Generalized anxiety disorder: Secondary | ICD-10-CM

## 2020-09-21 ENCOUNTER — Ambulatory Visit (INDEPENDENT_AMBULATORY_CARE_PROVIDER_SITE_OTHER): Payer: BC Managed Care – PPO | Admitting: Psychology

## 2020-09-21 DIAGNOSIS — F411 Generalized anxiety disorder: Secondary | ICD-10-CM | POA: Diagnosis not present

## 2020-10-05 ENCOUNTER — Ambulatory Visit: Payer: BC Managed Care – PPO | Admitting: Psychology

## 2020-10-19 ENCOUNTER — Ambulatory Visit (INDEPENDENT_AMBULATORY_CARE_PROVIDER_SITE_OTHER): Payer: BC Managed Care – PPO | Admitting: Psychology

## 2020-10-19 DIAGNOSIS — F411 Generalized anxiety disorder: Secondary | ICD-10-CM | POA: Diagnosis not present

## 2020-10-25 ENCOUNTER — Telehealth: Payer: Self-pay

## 2020-10-25 MED ORDER — FLUCONAZOLE 150 MG PO TABS: 150.0000 mg | ORAL_TABLET | Freq: Once | ORAL | 0 refills | Status: AC

## 2020-10-25 NOTE — Telephone Encounter (Signed)
Pt reports vaginal itching and some discharge x 3 days, has tried OTC reports she get intermittent.... there are no openings for you or anyone else for a few days and would like to have Diflucan sent in please advise  [facility]

## 2020-10-25 NOTE — Telephone Encounter (Signed)
I sent it  F/u if no improvement

## 2020-10-25 NOTE — Telephone Encounter (Signed)
Pt notified Rx sent and advise of PCP comments

## 2020-11-02 ENCOUNTER — Ambulatory Visit (INDEPENDENT_AMBULATORY_CARE_PROVIDER_SITE_OTHER): Payer: BC Managed Care – PPO | Admitting: Psychology

## 2020-11-02 DIAGNOSIS — F411 Generalized anxiety disorder: Secondary | ICD-10-CM

## 2020-11-05 ENCOUNTER — Encounter: Payer: Self-pay | Admitting: Family Medicine

## 2020-11-05 ENCOUNTER — Telehealth: Payer: Self-pay | Admitting: *Deleted

## 2020-11-05 NOTE — Telephone Encounter (Signed)
I need to see her first to make sure she does not need an antibiotic.  She can try soaking in soapy water.

## 2020-11-05 NOTE — Telephone Encounter (Signed)
Sent mychart message letting pt know Dr. Tower's comments  

## 2020-11-05 NOTE — Telephone Encounter (Signed)
pt sent a message saying that she realized that she has a "pincer toenail" on her right big toe. It is becoming very sore and starting to occasionally have sharp stabbing pain. Pt wanted to know if you could put a referral in for her to see a podiatrist or does she need to be seen 1st. Please advise

## 2020-11-11 ENCOUNTER — Telehealth (INDEPENDENT_AMBULATORY_CARE_PROVIDER_SITE_OTHER): Payer: Self-pay | Admitting: Adult Health

## 2020-11-11 ENCOUNTER — Encounter: Payer: Self-pay | Admitting: Adult Health

## 2020-11-11 ENCOUNTER — Encounter: Payer: Self-pay | Admitting: Family Medicine

## 2020-11-11 VITALS — Ht 67.99 in | Wt 200.0 lb

## 2020-11-11 DIAGNOSIS — L237 Allergic contact dermatitis due to plants, except food: Secondary | ICD-10-CM

## 2020-11-11 DIAGNOSIS — L258 Unspecified contact dermatitis due to other agents: Secondary | ICD-10-CM

## 2020-11-11 MED ORDER — PREDNISONE 10 MG (21) PO TBPK: ORAL_TABLET | ORAL | 0 refills | Status: DC

## 2020-11-11 MED ORDER — TRIAMCINOLONE ACETONIDE 0.5 % EX OINT: 1.0000 "application " | TOPICAL_OINTMENT | Freq: Two times a day (BID) | CUTANEOUS | 0 refills | Status: DC

## 2020-11-11 NOTE — Patient Instructions (Signed)
Prednisone tablets What is this medicine? PREDNISONE (PRED ni sone) is a corticosteroid. It is commonly used to treat inflammation of the skin, joints, lungs, and other organs. Common conditions treated include asthma, allergies, and arthritis. It is also used for other conditions, such as blood disorders and diseases of the adrenal glands. This medicine may be used for other purposes; ask your health care provider or pharmacist if you have questions. COMMON BRAND NAME(S): Deltasone, Predone, Sterapred, Sterapred DS What should I tell my health care provider before I take this medicine? They need to know if you have any of these conditions:  Cushing's syndrome  diabetes  glaucoma  heart disease  high blood pressure  infection (especially a virus infection such as chickenpox, cold sores, or herpes)  kidney disease  liver disease  mental illness  myasthenia gravis  osteoporosis  seizures  stomach or intestine problems  thyroid disease  an unusual or allergic reaction to lactose, prednisone, other medicines, foods, dyes, or preservatives  pregnant or trying to get pregnant  breast-feeding How should I use this medicine? Take this medicine by mouth with a glass of water. Follow the directions on the prescription label. Take this medicine with food. If you are taking this medicine once a day, take it in the morning. Do not take more medicine than you are told to take. Do not suddenly stop taking your medicine because you may develop a severe reaction. Your doctor will tell you how much medicine to take. If your doctor wants you to stop the medicine, the dose may be slowly lowered over time to avoid any side effects. Talk to your pediatrician regarding the use of this medicine in children. Special care may be needed. Overdosage: If you think you have taken too much of this medicine contact a poison control center or emergency room at once. NOTE: This medicine is only for you.  Do not share this medicine with others. What if I miss a dose? If you miss a dose, take it as soon as you can. If it is almost time for your next dose, talk to your doctor or health care professional. You may need to miss a dose or take an extra dose. Do not take double or extra doses without advice. What may interact with this medicine? Do not take this medicine with any of the following medications:  metyrapone  mifepristone This medicine may also interact with the following medications:  aminoglutethimide  amphotericin B  aspirin and aspirin-like medicines  barbiturates  certain medicines for diabetes, like glipizide or glyburide  cholestyramine  cholinesterase inhibitors  cyclosporine  digoxin  diuretics  ephedrine  female hormones, like estrogens and birth control pills  isoniazid  ketoconazole  NSAIDS, medicines for pain and inflammation, like ibuprofen or naproxen  phenytoin  rifampin  toxoids  vaccines  warfarin This list may not describe all possible interactions. Give your health care provider a list of all the medicines, herbs, non-prescription drugs, or dietary supplements you use. Also tell them if you smoke, drink alcohol, or use illegal drugs. Some items may interact with your medicine. What should I watch for while using this medicine? Visit your doctor or health care professional for regular checks on your progress. If you are taking this medicine over a prolonged period, carry an identification card with your name and address, the type and dose of your medicine, and your doctor's name and address. This medicine may increase your risk of getting an infection. Tell your doctor or   health care professional if you are around anyone with measles or chickenpox, or if you develop sores or blisters that do not heal properly. If you are going to have surgery, tell your doctor or health care professional that you have taken this medicine within the last  twelve months. Ask your doctor or health care professional about your diet. You may need to lower the amount of salt you eat. This medicine may increase blood sugar. Ask your healthcare provider if changes in diet or medicines are needed if you have diabetes. What side effects may I notice from receiving this medicine? Side effects that you should report to your doctor or health care professional as soon as possible:  allergic reactions like skin rash, itching or hives, swelling of the face, lips, or tongue  changes in emotions or moods  changes in vision  depressed mood  eye pain  fever or chills, cough, sore throat, pain or difficulty passing urine  signs and symptoms of high blood sugar such as being more thirsty or hungry or having to urinate more than normal. You may also feel very tired or have blurry vision.  swelling of ankles, feet Side effects that usually do not require medical attention (report to your doctor or health care professional if they continue or are bothersome):  confusion, excitement, restlessness  headache  nausea, vomiting  skin problems, acne, thin and shiny skin  trouble sleeping  weight gain This list may not describe all possible side effects. Call your doctor for medical advice about side effects. You may report side effects to FDA at 1-800-FDA-1088. Where should I keep my medicine? Keep out of the reach of children. Store at room temperature between 15 and 30 degrees C (59 and 86 degrees F). Protect from light. Keep container tightly closed. Throw away any unused medicine after the expiration date. NOTE: This sheet is a summary. It may not cover all possible information. If you have questions about this medicine, talk to your doctor, pharmacist, or health care provider.  2021 Elsevier/Gold Standard (2018-03-19 10:54:22) Triamcinolone Aerosol, Cream, Lotion, Ointment What is this medicine? TRIAMCINOLONE (trye am SIN oh lone) is a  corticosteroid. It is used on the skin to reduce swelling, redness, itching, and allergic reactions. This medicine may be used for other purposes; ask your health care provider or pharmacist if you have questions. COMMON BRAND NAME(S): Aristocort, Aristocort A, Aristocort HP, Cinalog, Cinolar, DERMASORB TA Complete, Flutex, Kenalog, Pediaderm TA, Sila III, SP Rx 228, Triacet, Trianex, Triderm What should I tell my health care provider before I take this medicine? They need to know if you have any of these conditions:  any active infection  large areas of burned or damaged skin  skin wasting or thinning  an unusual or allergic reaction to triamcinolone, corticosteroids, other medicines, foods, dyes, or preservatives  pregnant or trying to get pregnant  breast-feeding How should I use this medicine? This medicine is for external use only. Do not take by mouth. Follow the directions on the prescription label. Wash your hands before and after use. Apply a thin film of medicine to the affected area. Do not cover with a bandage or dressing unless your doctor or health care professional tells you to. Do not use on healthy skin or over large areas of skin. Do not get this medicine in your eyes. If you do, rinse out with plenty of cool tap water. It is important not to use more medicine than prescribed. Do not use your  medicine more often than directed. Talk to your pediatrician regarding the use of this medicine in children. Special care may be needed. Elderly patients are more likely to have damaged skin through aging, and this may increase side effects. This medicine should only be used for brief periods and infrequently in older patients. Overdosage: If you think you have taken too much of this medicine contact a poison control center or emergency room at once. NOTE: This medicine is only for you. Do not share this medicine with others. What if I miss a dose? If you miss a dose, use it as soon as  you can. If it is almost time for your next dose, use only that dose. Do not use double or extra doses. What may interact with this medicine? Interactions are not expected. This list may not describe all possible interactions. Give your health care provider a list of all the medicines, herbs, non-prescription drugs, or dietary supplements you use. Also tell them if you smoke, drink alcohol, or use illegal drugs. Some items may interact with your medicine. What should I watch for while using this medicine? Tell your doctor or health care professional if your symptoms do not start to get better within one week. Do not use for more than 14 days. Do not use on healthy skin or over large areas of skin. Tell your doctor or health care professional if you are exposed to anyone with measles or chickenpox, or if you develop sores or blisters that do not heal properly. Do not use an airtight bandage to cover the affected area unless your doctor or health care professional tells you to. If you are to cover the area, follow the instructions carefully. Covering the area where the medicine is applied can increase the amount that passes through the skin and increases the risk of side effects. If treating the diaper area of a child, avoid covering the treated area with tight-fitting diapers or plastic pants. This may increase the amount of medicine that passes through the skin and increase the risk of serious side effects. What side effects may I notice from receiving this medicine? Side effects that you should report to your doctor or health care professional as soon as possible:  burning or itching of the skin  dark red spots on the skin  infection  painful, red, pus filled blisters in hair follicles  thinning of the skin, sunburn more likely especially on the face Side effects that usually do not require medical attention (report to your doctor or health care professional if they continue or are  bothersome):  dry skin, irritation  unusual increased growth of hair on the face or body This list may not describe all possible side effects. Call your doctor for medical advice about side effects. You may report side effects to FDA at 1-800-FDA-1088. Where should I keep my medicine? Keep out of the reach of children. Store at room temperature between 15 and 30 degrees C (59 and 86 degrees F). Do not freeze. Throw away any unused medicine after the expiration date. NOTE: This sheet is a summary. It may not cover all possible information. If you have questions about this medicine, talk to your doctor, pharmacist, or health care provider.  2021 Elsevier/Gold Standard (2020-04-29 11:04:44) Poison Oak Dermatitis  Poison oak dermatitis is redness and soreness (inflammation) of the skin caused by chemicals in the leaves of the poison oak plant. You may have very bad itching, swelling, a rash, and blisters. What are  the causes? You may get this condition by:  Touching a poison oak plant.  Touching something that has the chemical from the leaves on it. This may include animals or objects that have come in contact with the plant. What increases the risk? You are more likely to get this condition if you:  Go outdoors often in wooded or marshy areas.  Go outdoors without wearing protective clothing, such as closed shoes, long pants, and a long-sleeved shirt. What are the signs or symptoms? Symptoms of this condition include:  Redness of the skin.  Very bad itching.  A rash that often includes bumps and blisters. ? The rash usually appears 48 hours after exposure if you have been exposed before. ? If this is the first time you have been exposed, the rash may not appear until a week after exposure.  Swelling. This may occur if the reaction is very bad. Symptoms often clear up in 1-2 weeks. The first time you develop this condition, symptoms may last 3-4 weeks. How is this treated? This  condition may be treated with:  Hydrocortisone creams or calamine lotions to help with itching.  Oatmeal baths to soothe the skin.  Medicines to help reduce itching (antihistamines). If you have a very bad reaction, you may also be given steroid medicines. Follow these instructions at home: Medicines  Take or apply over-the-counter and prescription medicines only as told by your doctor.  Use hydrocortisone creams or calamine lotion as needed to help with itching. General instructions  Do not scratch or rub your skin.  Put a cold, wet cloth (cold compress) on the affected areas or take baths in cool water. This will help with itching.  Avoid hot baths and showers.  Take oatmeal baths as needed. Use colloidal oatmeal. You can get this at a pharmacy or grocery store. Follow the instructions on the package.  While you have the rash, wash your clothes right after you wear them.  Keep all follow-up visits as told by your doctor. This is important. How is this prevented?  Know what poison oak looks like so you can avoid it. ? This plant has three leaves with flowering branches on a single stem. ? The leaves are fuzzy. ? The edges of the leaves look like teeth.  If you have touched poison oak, wash your skin with soap and water right away. Be sure to wash under your fingernails.  When hiking or camping, wear long pants, a long-sleeved shirt, tall socks, and hiking boots. You can also use a lotion on your skin that helps to prevent contact with the chemical on the plant.  If you think that your clothes or outdoor gear came in contact with poison oak, rinse them off with a garden hose before you bring them inside your house.  When doing yard work or gardening, wear gloves, long sleeves, long pants, and boots. Wash your garden tools and gloves if they come in contact with poison oak.  If you think that your pet has come into contact with poison oak, wash him or her with pet shampoo and  water. Make sure you wear gloves while washing your pet.  Do not burn poison oak plants. This can release the chemical from the plant into the air and may cause a reaction.   Contact a doctor if:  You have open sores in the rash area.  You have more redness, swelling, or pain in the affected area.  You have redness that spreads beyond the  rash area.  You have fluid, blood, or pus coming from the affected area.  You have a fever.  You have a rash over a large area of your body.  You have a rash on your eyes, mouth, or genitals.  Your rash does not improve after a few weeks. Get help right away if:  Your face swells or your eyes swell shut.  You have trouble breathing.  You have trouble swallowing. These symptoms may be an emergency. Do not wait to see if the symptoms will go away. Get medical help right away. Call your local emergency services (911 in the [place] Do not drive yourself to the hospital. Summary  Poison oak dermatitis is redness and soreness of the skin caused by chemicals in the leaves of the poison oak plant.  Symptoms of this condition include redness, very bad itching, a rash, and swelling.  Do not scratch or rub your skin.  Take or apply over-the-counter and prescription medicines only as told by your doctor. This information is not intended to replace advice given to you by your health care provider. Make sure you discuss any questions you have with your health care provider. Document Revised: 10/11/2018 Document Reviewed: 07/19/2018 Elsevier Patient Education  2021 [employer].

## 2020-11-11 NOTE — Progress Notes (Signed)
Virtual Visit via Video Note  I connected with Michele [name] on 11/11/20 at  9:30 AM EDT by a video enabled telemedicine application and verified that I am speaking with the correct person using two identifiers. Parties involved in visit as below:   Location: Patient: at home  Provider: Provider: Provider's office at  [facility], [place].      I discussed the limitations of evaluation and management by telemedicine and the availability of in person appointments. The patient expressed understanding and agreed to proceed.  History of Present Illness:   She was exposed to poison oak, and she has it on hands bilateral. No facial involvement now. Is using anti itch cream helping some but she reports when she gets it that the poison oak spreads fast.  Has a history of allergic poison oak dermatitis.  Denies any other contact, new product, recent illness.    Patient  denies any fever, body aches,chills,  chest pain, shortness of breath, nausea, vomiting, or diarrhea.  Denies dizziness, lightheadedness, pre syncopal or syncopal episodes.   Allergies  Allergen Reactions  . Codeine Itching  . Hydrocodone-Acetaminophen Itching  . Povidone-Iodine Rash       . Propoxyphene N-Acetaminophen Itching         Observations/Objective:    Patient is alert and oriented and responsive to questions Engages in conversation with provider. Speaks in full sentences without any pauses without any shortness of breath or distress.   Rash on bilateral hands noted left worst than right. Unable to completely visualize with video.   Assessment and Plan:   The primary encounter diagnosis was Contact dermatitis due to other agent, unspecified contact dermatitis type. A diagnosis of Poison oak dermatitis was also pertinent to this visit.  Ok to use calamine lotion or other equivalent use steroid cream sparingly not for face and use only if intense itching. Cool showers. Avoid  scratching. Monitor for any signs of infection. Return if any symptoms worsening or not improving at anytime be seen in person.    Meds ordered this encounter  Medications  . predniSONE (STERAPRED UNI-PAK 21 TAB) 10 MG (21) TBPK tablet    Sig: PO: Take 6 tablets on day 1:Take 5 tablets day 2:Take 4 tablets day 3: Take 3 tablets day 4:Take 2 tablets day five: 5 Take 1 tablet day 6    Dispense:  21 tablet    Refill:  0  . triamcinolone ointment (KENALOG) 0.5 %    Sig: Apply 1 application topically 2 (two) times daily. Use sparingly as needed/ directed.    Dispense:  30 g    Refill:  0     Return in about 5 days (around 11/16/2020), or if symptoms worsen or fail to improve, for at any time for any worsening symptoms, Go to Emergency room/ urgent care if worse. Follow Up Instructions:  Advised in person evaluation at anytime is advised if any symptoms do not improve, worsen or change at any given time.  Red Flags discussed. The patient was given clear instructions to go to ER or return to medical center if any red flags develop, symptoms do not improve, worsen or new problems develop. They verbalized understanding. I discussed the assessment and treatment plan with the patient. The patient was provided an opportunity to ask questions and all were answered. The patient agreed with the plan and demonstrated an understanding of the instructions.   The patient was advised to call back or seek an in-person  evaluation if the symptoms worsen or if the condition fails to improve as anticipated.    Marcille Buffy, FNP

## 2020-11-16 ENCOUNTER — Ambulatory Visit: Payer: BC Managed Care – PPO | Admitting: Psychology

## 2020-11-22 ENCOUNTER — Ambulatory Visit: Payer: BC Managed Care – PPO | Admitting: Family Medicine

## 2020-11-22 ENCOUNTER — Other Ambulatory Visit: Payer: Self-pay

## 2020-11-22 ENCOUNTER — Telehealth: Payer: Self-pay | Admitting: Adult Health

## 2020-11-22 VITALS — BP 102/76 | HR 62 | Temp 97.0°F | Wt 209.0 lb

## 2020-11-22 DIAGNOSIS — R233 Spontaneous ecchymoses: Secondary | ICD-10-CM

## 2020-11-22 DIAGNOSIS — L089 Local infection of the skin and subcutaneous tissue, unspecified: Secondary | ICD-10-CM

## 2020-11-22 DIAGNOSIS — S40262A Insect bite (nonvenomous) of left shoulder, initial encounter: Secondary | ICD-10-CM

## 2020-11-22 DIAGNOSIS — W57XXXA Bitten or stung by nonvenomous insect and other nonvenomous arthropods, initial encounter: Secondary | ICD-10-CM | POA: Diagnosis not present

## 2020-11-22 MED ORDER — DOXYCYCLINE HYCLATE 100 MG PO TABS: 100.0000 mg | ORAL_TABLET | Freq: Two times a day (BID) | ORAL | 0 refills | Status: AC

## 2020-11-22 NOTE — Patient Instructions (Signed)
I'm concerned your insect bite may be infected  Start the antibiotics  If you develop new or spreading rash or fevers/chills worsening symptoms call  Wear sunblock if going outside

## 2020-11-22 NOTE — Assessment & Plan Note (Signed)
Etiology unclear and small distribution. Pt with hx of RMSF and will notify if rash is spreading or progressing.

## 2020-11-22 NOTE — Progress Notes (Signed)
Subjective:     Michele [name] is a 55 y.o. female presenting for Rash (Possible bug bite, L shoulder x 5 days )     HPI  #Rash/lesion - 2 lesions - left shoulder - 2 swollen lumps - swelling and redness - painful - itchy - saw some blood on her bra strap - outdoor exposure - yesterday but this started before that - [place] - no known tick   - notes low grade high 90s  - negative covid test yesterday - cough - asthma   Review of Systems  Constitutional: Negative for chills and fever.  Musculoskeletal: Negative for arthralgias and myalgias.     Social History   Tobacco Use  Smoking Status Former Smoker  . Packs/day: 0.25  . Years: 5.00  . Pack years: 1.25  . Types: Cigarettes  . Quit date: 07/03/1992  . Years since quitting: 28.4  Smokeless Tobacco Never Used        Objective:    BP Readings from Last 3 Encounters:  11/22/20 102/76  07/15/20 130/76  06/23/20 (!) 181/83   Wt Readings from Last 3 Encounters:  11/22/20 209 lb (94.8 kg)  11/11/20 200 lb (90.7 kg)  07/15/20 200 lb (90.7 kg)    BP 102/76   Pulse 62   Temp (!) 97 F (36.1 C) (Temporal)   Wt 209 lb (94.8 kg)   LMP 03/06/2014   SpO2 96%   BMI 31.79 kg/m    Physical Exam Constitutional:      General: She is not in acute distress.    Appearance: She is well-developed. She is not diaphoretic.  HENT:     Right Ear: External ear normal.     Left Ear: External ear normal.     Nose: Nose normal.  Eyes:     Conjunctiva/sclera: Conjunctivae normal.  Cardiovascular:     Rate and Rhythm: Normal rate.  Pulmonary:     Effort: Pulmonary effort is normal.  Musculoskeletal:     Cervical back: Neck supple.  Skin:    General: Skin is warm and dry.     Capillary Refill: Capillary refill takes less than 2 seconds.     Comments: Left shoulder:  Raised, erythematous lesions with central ulcer and tender to  palpation w/o fluctuance. Adjacent to this is a non-blanching petechial rash in the linear alignment.    Neurological:     Mental Status: She is alert. Mental status is at baseline.  Psychiatric:        Mood and Affect: Mood normal.        Behavior: Behavior normal.           Assessment & Plan:   Problem List Items Addressed This Visit      Musculoskeletal and Integument   Petechial rash    Etiology unclear and small distribution. Pt with hx of RMSF and will notify if rash is spreading or progressing.        Other Visit Diagnoses    Infected insect bite of shoulder, left, initial encounter    -  Primary   Relevant Medications   doxycycline (VIBRA-TABS) 100 MG tablet     Concerning for local infection. Will treat with doxycyline   Return if symptoms worsen or fail to improve.  Lesleigh Noe, MD  This visit occurred during the SARS-CoV-2 public health emergency.  Safety protocols were in  place, including screening questions prior to the visit, additional usage of staff PPE, and extensive cleaning of exam room while observing appropriate contact time as indicated for disinfecting solutions.

## 2020-11-30 ENCOUNTER — Ambulatory Visit: Payer: BC Managed Care – PPO | Admitting: Psychology

## 2020-12-02 ENCOUNTER — Other Ambulatory Visit: Payer: Self-pay

## 2020-12-02 ENCOUNTER — Ambulatory Visit: Payer: BC Managed Care – PPO | Admitting: Family Medicine

## 2020-12-02 VITALS — BP 130/80 | HR 74 | Temp 98.2°F | Wt 210.0 lb

## 2020-12-02 DIAGNOSIS — Z862 Personal history of diseases of the blood and blood-forming organs and certain disorders involving the immune mechanism: Secondary | ICD-10-CM

## 2020-12-02 DIAGNOSIS — S30861A Insect bite (nonvenomous) of abdominal wall, initial encounter: Secondary | ICD-10-CM | POA: Diagnosis not present

## 2020-12-02 DIAGNOSIS — R7303 Prediabetes: Secondary | ICD-10-CM

## 2020-12-02 DIAGNOSIS — B351 Tinea unguium: Secondary | ICD-10-CM

## 2020-12-02 DIAGNOSIS — E782 Mixed hyperlipidemia: Secondary | ICD-10-CM

## 2020-12-02 DIAGNOSIS — R6889 Other general symptoms and signs: Secondary | ICD-10-CM | POA: Diagnosis not present

## 2020-12-02 DIAGNOSIS — L600 Ingrowing nail: Secondary | ICD-10-CM

## 2020-12-02 DIAGNOSIS — R5383 Other fatigue: Secondary | ICD-10-CM | POA: Diagnosis not present

## 2020-12-02 DIAGNOSIS — W57XXXA Bitten or stung by nonvenomous insect and other nonvenomous arthropods, initial encounter: Secondary | ICD-10-CM

## 2020-12-02 LAB — CBC
HCT: 41.2 % (ref 36.0–46.0)
Hemoglobin: 14 g/dL (ref 12.0–15.0)
MCHC: 34.1 g/dL (ref 30.0–36.0)
MCV: 88.9 fl (ref 78.0–100.0)
Platelets: 238 10*3/uL (ref 150.0–400.0)
RBC: 4.64 Mil/uL (ref 3.87–5.11)
RDW: 13 % (ref 11.5–15.5)
WBC: 5.1 10*3/uL (ref 4.0–10.5)

## 2020-12-02 LAB — TSH: TSH: 2.96 u[IU]/mL (ref 0.35–4.50)

## 2020-12-02 LAB — FERRITIN: Ferritin: 91.4 ng/mL (ref 10.0–291.0)

## 2020-12-02 LAB — HEMOGLOBIN A1C: Hgb A1c MFr Bld: 6.3 % (ref 4.6–6.5)

## 2020-12-02 NOTE — Assessment & Plan Note (Signed)
>  1 year since labs and given fatigue will get blood work including lyme disease given recent bites. Bites today not consistent so will not treat but will test and treat if positive.

## 2020-12-02 NOTE — Assessment & Plan Note (Signed)
Likely ingrown. Will refer to podiatry

## 2020-12-02 NOTE — Patient Instructions (Signed)
Use triamcinolone on the bites if more red or itchy  Call if new fevers or rashes  Labs today  Schedule annual with Dr. Glori Bickers

## 2020-12-02 NOTE — Progress Notes (Signed)
Subjective:     Michele [name] is a 55 y.o. female presenting for Insect Bite (2 on abdomen )     HPI   #Insect Bite - a few bites - has also had lesions on the shoulder and arms - has photos  - had one on  - was treated for possible local infection on the shoulder lesion with doxycycline - has completed the antibiotics - has felt tired frequently and all the time - was not outside yesterday and took a 2 hour a nap  - woke up disoriented and tired - sleeping fine - a few weeks of not feeling herself - does have pets - no one else with bites - [place] and [place] tick check most days - a few times she has not done this - very itchy - treatment: anti-itch cream     Review of Systems  Constitutional: Positive for fatigue. Negative for chills and fever.  Endocrine: Positive for heat intolerance.  Musculoskeletal: Negative for arthralgias and myalgias.     Social History   Tobacco Use  Smoking Status Former Smoker  . Packs/day: 0.25  . Years: 5.00  . Pack years: 1.25  . Types: Cigarettes  . Quit date: 07/03/1992  . Years since quitting: 28.4  Smokeless Tobacco Never Used        Objective:    BP Readings from Last 3 Encounters:  12/02/20 130/80  11/22/20 102/76  07/15/20 130/76   Wt Readings from Last 3 Encounters:  12/02/20 210 lb (95.3 kg)  11/22/20 209 lb (94.8 kg)  11/11/20 200 lb (90.7 kg)    BP 130/80   Pulse 74   Temp 98.2 F (36.8 C) (Temporal)   Wt 210 lb (95.3 kg)   LMP 03/06/2014   SpO2 97%   BMI 31.94 kg/m    Physical Exam Constitutional:      General: She is not in acute distress.    Appearance: She is well-developed. She is not diaphoretic.  HENT:     Right Ear: External ear normal.     Left Ear: External ear normal.  Eyes:     Conjunctiva/sclera: Conjunctivae normal.  Cardiovascular:     Rate and Rhythm: Normal rate and regular rhythm.     Heart sounds: No murmur heard.   Pulmonary:     Effort: Pulmonary effort  is normal. No respiratory distress.     Breath sounds: Normal breath sounds. No wheezing.  Musculoskeletal:     Cervical back: Neck supple.  Feet:     Comments: Right great toe with thickened and yellow nail which appears slightly embedded in the skin. No erythema of the skin Skin:    General: Skin is warm and dry.     Capillary Refill: Capillary refill takes less than 2 seconds.     Comments: Abdomen - right lower - 2 small healing ulcers with surrounding erythema which is improved from photos.  Left shoulder - healing raised lesion with faint erythema  Neurological:     Mental Status: She is alert. Mental status is at baseline.  Psychiatric:        Mood and Affect: Mood normal.        Behavior: Behavior normal.           Assessment & Plan:   Problem List Items Addressed This Visit      Musculoskeletal and Integument   Ingrown toenail of right foot    Likely ingrown. Will refer to podiatry  Relevant Orders   Ambulatory referral to Podiatry   Onychomycosis    Discussed treatment is not very effective. Pt not bothered but this and just wants the toenail addressed. Podiatry referral        Other   Mixed hyperlipidemia   Relevant Orders   Comprehensive metabolic panel   Lipid panel   History of anemia   Relevant Orders   CBC   Prediabetes   Relevant Orders   Comprehensive metabolic panel   Hemoglobin A1c   Other fatigue - Primary    >1 year since labs and given fatigue will get blood work including lyme disease given recent bites. Bites today not consistent so will not treat but will test and treat if positive.       Relevant Orders   Comprehensive metabolic panel   TSH   Ferritin    Other Visit Diagnoses    Insect bite of abdominal wall, initial encounter       Relevant Orders   Lyme Ab/Western Blot Reflex   Heat intolerance       Relevant Orders   CBC     Insect bite - suspect just heightened allergic reaction but with fatigue will get lyme  disease work-up.    Return if symptoms worsen or fail to improve.  Lesleigh Noe, MD  This visit occurred during the SARS-CoV-2 public health emergency.  Safety protocols were in place, including screening questions prior to the visit, additional usage of staff PPE, and extensive cleaning of exam room while observing appropriate contact time as indicated for disinfecting solutions.

## 2020-12-02 NOTE — Assessment & Plan Note (Signed)
Discussed treatment is not very effective. Pt not bothered but this and just wants the toenail addressed. Podiatry referral

## 2020-12-03 LAB — LIPID PANEL
Cholesterol: 162 mg/dL (ref 0–200)
HDL: 60.3 mg/dL (ref >39.00)
LDL Cholesterol: 78 mg/dL (ref 0–99)
NonHDL: 101.93
Total CHOL/HDL Ratio: 3
Triglycerides: 119 mg/dL (ref 0.0–149.0)
VLDL: 23.8 mg/dL (ref 0.0–40.0)

## 2020-12-03 LAB — COMPREHENSIVE METABOLIC PANEL
ALT: 19 U/L (ref 0–35)
AST: 16 U/L (ref 0–37)
Albumin: 4.3 g/dL (ref 3.5–5.2)
Alkaline Phosphatase: 136 U/L — ABNORMAL HIGH (ref 39–117)
BUN: 9 mg/dL (ref 6–23)
CO2: 25 mEq/L (ref 19–32)
Calcium: 9.5 mg/dL (ref 8.4–10.5)
Chloride: 103 mEq/L (ref 96–112)
Creatinine, Ser: 0.82 mg/dL (ref 0.40–1.20)
GFR: 80.23 mL/min (ref >60.00)
Glucose, Bld: 171 mg/dL — ABNORMAL HIGH (ref 70–99)
Potassium: 4.2 mEq/L (ref 3.5–5.1)
Sodium: 142 mEq/L (ref 135–145)
Total Bilirubin: 0.4 mg/dL (ref 0.2–1.2)
Total Protein: 6.2 g/dL (ref 6.0–8.3)

## 2020-12-05 ENCOUNTER — Other Ambulatory Visit: Payer: Self-pay | Admitting: Family Medicine

## 2020-12-06 ENCOUNTER — Ambulatory Visit
Admission: RE | Admit: 2020-12-06 | Discharge: 2020-12-06 | Disposition: A | Discharge status: Home / Self Care | Payer: BC Managed Care – PPO | Source: Ambulatory Visit | Attending: Family Medicine | Admitting: Family Medicine

## 2020-12-06 ENCOUNTER — Other Ambulatory Visit: Payer: Self-pay | Admitting: Family Medicine

## 2020-12-06 ENCOUNTER — Other Ambulatory Visit: Payer: Self-pay

## 2020-12-06 DIAGNOSIS — J4520 Mild intermittent asthma, uncomplicated: Secondary | ICD-10-CM

## 2020-12-06 DIAGNOSIS — Z1231 Encounter for screening mammogram for malignant neoplasm of breast: Secondary | ICD-10-CM

## 2020-12-07 LAB — SPECIMEN STATUS REPORT

## 2020-12-07 LAB — LYME DISEASE SEROLOGY W/REFLEX: Lyme Total Antibody EIA: NEGATIVE

## 2020-12-08 ENCOUNTER — Ambulatory Visit (INDEPENDENT_AMBULATORY_CARE_PROVIDER_SITE_OTHER): Payer: BC Managed Care – PPO

## 2020-12-08 ENCOUNTER — Ambulatory Visit: Payer: BC Managed Care – PPO | Admitting: Podiatry

## 2020-12-08 ENCOUNTER — Other Ambulatory Visit: Payer: Self-pay

## 2020-12-08 ENCOUNTER — Encounter: Payer: Self-pay | Admitting: Podiatry

## 2020-12-08 DIAGNOSIS — L600 Ingrowing nail: Secondary | ICD-10-CM

## 2020-12-08 DIAGNOSIS — M722 Plantar fascial fibromatosis: Secondary | ICD-10-CM | POA: Diagnosis not present

## 2020-12-08 MED ORDER — NEOMYCIN-POLYMYXIN-HC 1 % OT SOLN: OTIC | 1 refills | Status: DC

## 2020-12-08 MED ORDER — TRIAMCINOLONE ACETONIDE 40 MG/ML IJ SUSP
20.0000 mg | Freq: Once | INTRAMUSCULAR | Status: AC
  Administered 2020-12-08: 20 mg

## 2020-12-08 NOTE — Patient Instructions (Signed)

## 2020-12-08 NOTE — Progress Notes (Signed)
Subjective:  Patient ID: Michele [name], female    DOB: Nov 13, 1965,  MRN: [id number] HPI Chief Complaint  Patient presents with  . Foot Pain    Plantar arch/heel left - aching x 6 months, AM pain, tried massaging and aleve  . Toe Pain    Hallux right - medial border - "got a pincher toe", tender, nail is thicker to that side, tried cutting out some  . New Patient (Initial Visit)    55 y.o. female presents with the above complaint.   ROS: Denies fever chills nausea vomiting muscle aches pains calf pain back pain chest pain shortness of breath.  Past Medical History:  Diagnosis Date  . Abnormal Pap smear of cervix    cryotx  . Anxiety   . Arthritis   . Asthma   . Depression   . Endometriosis   . Fatty liver   . Gastric polyp   . GERD (gastroesophageal reflux disease)   . Hemorrhoids   . Hiatal hernia   . HLD (hyperlipidemia)   . Hyperplastic colon polyp   . IBS (irritable bowel syndrome)   . Iron deficiency anemia 1/12  . Pneumonia   . Rectocele   . Rotator cuff tear, left 2009  . Syncope   . Tachycardia    s/p negative cardiac work up with echo  . Uterine fibroid [id number]   Past Surgical History:  Procedure Laterality Date  . CARPAL TUNNEL RELEASE Bilateral   . COLONOSCOPY  1999   small polyps, hemms  . ESOPHAGOGASTRODUODENOSCOPY  2/12   gastric polyp and HH  . HYSTEROSCOPY    . LAPAROSCOPY  2006   endometriosis  . ROTATOR CUFF REPAIR Left   . TRIGGER FINGER RELEASE      Current Outpatient Medications:  .  NEOMYCIN-POLYMYXIN-HYDROCORTISONE (CORTISPORIN) 1 % SOLN OTIC solution, Apply 1-2 drops to toe BID after soaking, Disp: 10 mL, Rfl: 1 .  albuterol (VENTOLIN HFA) 108 (90 Base) MCG/ACT inhaler, INHALE 2 PUFFS INTO THE LUNGS EVERY 4 HOURS AS NEEDED FOR WHEEZE, Disp: 8.5 each, Rfl: 0 .  ALPRAZolam (XANAX) 0.5 MG tablet, Take 0.5-1 tablets (0.25-0.5 mg total) by mouth 2 (two) times daily as needed for anxiety., Disp: 60 tablet, Rfl: 5 .  aspirin EC 81 MG tablet,  Take 81 mg by mouth daily., Disp: , Rfl:  .  atorvastatin (LIPITOR) 80 MG tablet, Take 1 tablet (80 mg total) by mouth daily., Disp: 90 tablet, Rfl: 3 .  Biotin w/ Vitamins C & E (HAIR/SKIN/NAILS PO), Take 3 tablets by mouth daily., Disp: , Rfl:  .  busPIRone (BUSPAR) 30 MG tablet, Take 1 tablet (30 mg total) by mouth 2 (two) times daily., Disp: 180 tablet, Rfl: 1 .  CALCIUM PO, Take by mouth., Disp: , Rfl:  .  cholecalciferol (VITAMIN D3) 25 MCG (1000 UNIT) tablet, Take 1,000 Units by mouth daily., Disp: , Rfl:  .  diphenhydrAMINE (BENADRYL) 25 MG tablet, Take 75 mg by mouth at bedtime as needed for sleep., Disp: , Rfl:  .  esomeprazole (NEXIUM) 40 MG capsule, TAKE 1 CAPSULE BY MOUTH EVERY DAY IN THE MORNING, Disp: 90 capsule, Rfl: 0 .  fluticasone (FLONASE) 50 MCG/ACT nasal spray, Place 1 spray into both nostrils daily for 14 days., Disp: 16 g, Rfl: 0 .  lamoTRIgine (LAMICTAL) 100 MG tablet, 2.5 pills qhs, Disp: 225 tablet, Rfl: 1 .  meclizine (ANTIVERT) 25 MG tablet, Take 1 tablet (25 mg total) by mouth 3 (three) times daily as needed for  dizziness or nausea., Disp: 30 tablet, Rfl: 0 .  meloxicam (MOBIC) 15 MG tablet, Take 1 tablet (15 mg total) by mouth daily as needed for pain. With food, Disp: 30 tablet, Rfl: 1 .  metoprolol tartrate (LOPRESSOR) 25 MG tablet, TAKE 1 TABLET BY MOUTH 2 TIMES DAILY., Disp: 180 tablet, Rfl: 3 .  montelukast (SINGULAIR) 10 MG tablet, TAKE 1 TABLET BY MOUTH EVERYDAY AT BEDTIME, Disp: 90 tablet, Rfl: 0 .  naproxen sodium (ALEVE) 220 MG tablet, Take 440 mg by mouth daily as needed (pain)., Disp: , Rfl:  .  oxcarbazepine (TRILEPTAL) 600 MG tablet, Take 1 tablet (600 mg total) by mouth 2 (two) times daily., Disp: 180 tablet, Rfl: 1 .  Prenatal Vit-Fe Fumarate-FA (PRENATAL PO), Take 1 tablet by mouth daily., Disp: , Rfl:  .  triamcinolone ointment (KENALOG) 0.5 %, Apply 1 application topically 2 (two) times daily. Use sparingly as needed/ directed., Disp: 30 g, Rfl: 0 .   vitamin C (ASCORBIC ACID) 500 MG tablet, Take 500 mg by mouth daily., Disp: , Rfl:  .  vitamin E (VITAMIN E) 400 UNIT capsule, Take 2 capsules (800 Units total) by mouth daily., Disp: 60 capsule, Rfl: 5 .  zinc gluconate 50 MG tablet, Take 50 mg by mouth daily., Disp: , Rfl:   Allergies  Allergen Reactions  . Codeine Itching  . Hydrocodone-Acetaminophen Itching  . Other   . Povidone-Iodine Rash       . Propoxyphene N-Acetaminophen Itching        Review of Systems Objective:  There were no vitals filed for this visit.  General: Well developed, nourished, in no acute distress, alert and oriented x3   Dermatological: Skin is warm, dry and supple bilateral. Nails x 10 are well maintained; remaining integument appears unremarkable at this time. There are no open sores, no preulcerative lesions, no rash or signs of infection present.  Onychocryptosis Sharp innervated nail margin along the tibia-fibula border the hallux right.  Mild erythema no edema no cellulitis drainage or odor.  Vascular: Dorsalis Pedis artery and Posterior Tibial artery pedal pulses are 2/4 bilateral with immedate capillary fill time. Pedal hair growth present. No varicosities and no lower extremity edema present bilateral.   Neruologic: Grossly intact via light touch bilateral. Vibratory intact via tuning fork bilateral. Protective threshold with Semmes Wienstein monofilament intact to all pedal sites bilateral. Patellar and Achilles deep tendon reflexes 2+ bilateral. No Babinski or clonus noted bilateral.   Musculoskeletal: No gross boney pedal deformities bilateral. No pain, crepitus, or limitation noted with foot and ankle range of motion bilateral. Muscular strength 5/5 in all groups tested bilateral.  Planter fasciitis with pain on palpation medial calcaneal tubercle of the left heel.  Gait: Unassisted, Nonantalgic.    Radiographs:  Radiographs taken today demonstrate soft tissue increase in density plantar  fascial cannula surgical site left heel other than osseously mature individual with no acute findings.  Assessment & Plan:   Assessment: Ingrown toenail tibial border and fibular border hallux right.  Plan fasciitis left.  Plan: Discussed etiology pathology conservative surgical therapies at this point performed a chemical matricectomy to the tibial and fibular border of the hallux right.  Injected her left heel today 20 mg Kenalog 5 mg Marcaine.  She was provided both oral written home-going structure for care and soaking of the right foot.  She also received a prescription for Cortisporin Otic to be applied twice daily after soaking.  I would like to follow-up with her in  a couple of weeks for reevaluation of the right foot and then I will follow-up with her for plantar fasciitis as well.  At that point we dispensed plantar fascial brace and medications if necessary.     Josue Kass T. [place], [place]

## 2020-12-14 ENCOUNTER — Telehealth: Payer: Self-pay | Admitting: Family Medicine

## 2020-12-14 ENCOUNTER — Ambulatory Visit: Payer: BC Managed Care – PPO | Admitting: Psychology

## 2020-12-14 DIAGNOSIS — E782 Mixed hyperlipidemia: Secondary | ICD-10-CM

## 2020-12-14 DIAGNOSIS — Z0000 Encounter for general adult medical examination without abnormal findings: Secondary | ICD-10-CM

## 2020-12-14 DIAGNOSIS — R7303 Prediabetes: Secondary | ICD-10-CM

## 2020-12-14 DIAGNOSIS — Z79899 Other long term (current) drug therapy: Secondary | ICD-10-CM

## 2020-12-14 NOTE — Telephone Encounter (Signed)
-----   Message from Cloyd Stagers, RT sent at 12/06/2020 11:18 AM EDT ----- Regarding: Lab Orders for Wednesday 6.15.2022 Please place lab orders for Wednesday 6.15.2022, office visit for physical on Wednesday 6.22.2022 Thank you, Dyke Maes RT(R)

## 2020-12-15 ENCOUNTER — Other Ambulatory Visit (INDEPENDENT_AMBULATORY_CARE_PROVIDER_SITE_OTHER): Payer: BC Managed Care – PPO

## 2020-12-15 ENCOUNTER — Encounter: Payer: Self-pay | Admitting: Podiatry

## 2020-12-15 ENCOUNTER — Telehealth: Payer: Self-pay

## 2020-12-15 ENCOUNTER — Ambulatory Visit (INDEPENDENT_AMBULATORY_CARE_PROVIDER_SITE_OTHER): Payer: BC Managed Care – PPO | Admitting: Podiatry

## 2020-12-15 ENCOUNTER — Other Ambulatory Visit: Payer: Self-pay

## 2020-12-15 DIAGNOSIS — Z0000 Encounter for general adult medical examination without abnormal findings: Secondary | ICD-10-CM

## 2020-12-15 DIAGNOSIS — Z79899 Other long term (current) drug therapy: Secondary | ICD-10-CM | POA: Diagnosis not present

## 2020-12-15 DIAGNOSIS — E782 Mixed hyperlipidemia: Secondary | ICD-10-CM | POA: Diagnosis not present

## 2020-12-15 DIAGNOSIS — S90111A Contusion of right great toe without damage to nail, initial encounter: Secondary | ICD-10-CM

## 2020-12-15 DIAGNOSIS — R7303 Prediabetes: Secondary | ICD-10-CM | POA: Diagnosis not present

## 2020-12-15 DIAGNOSIS — Z9889 Other specified postprocedural states: Secondary | ICD-10-CM

## 2020-12-15 DIAGNOSIS — L600 Ingrowing nail: Secondary | ICD-10-CM

## 2020-12-15 LAB — CBC WITH DIFFERENTIAL/PLATELET
Basophils Absolute: 0 10*3/uL (ref 0.0–0.1)
Basophils Relative: 0.5 % (ref 0.0–3.0)
Eosinophils Absolute: 0.1 10*3/uL (ref 0.0–0.7)
Eosinophils Relative: 1.8 % (ref 0.0–5.0)
HCT: 38.1 % (ref 36.0–46.0)
Hemoglobin: 13.2 g/dL (ref 12.0–15.0)
Lymphocytes Relative: 32.9 % (ref 12.0–46.0)
Lymphs Abs: 2.1 10*3/uL (ref 0.7–4.0)
MCHC: 34.6 g/dL (ref 30.0–36.0)
MCV: 88.7 fl (ref 78.0–100.0)
Monocytes Absolute: 0.5 10*3/uL (ref 0.1–1.0)
Monocytes Relative: 8.5 % (ref 3.0–12.0)
Neutro Abs: 3.6 10*3/uL (ref 1.4–7.7)
Neutrophils Relative %: 56.3 % (ref 43.0–77.0)
Platelets: 254 10*3/uL (ref 150.0–400.0)
RBC: 4.29 Mil/uL (ref 3.87–5.11)
RDW: 13.3 % (ref 11.5–15.5)
WBC: 6.4 10*3/uL (ref 4.0–10.5)

## 2020-12-15 LAB — HEMOGLOBIN A1C: Hgb A1c MFr Bld: 6.3 % (ref 4.6–6.5)

## 2020-12-15 LAB — COMPREHENSIVE METABOLIC PANEL
ALT: 16 U/L (ref 0–35)
AST: 14 U/L (ref 0–37)
Albumin: 4.2 g/dL (ref 3.5–5.2)
Alkaline Phosphatase: 121 U/L — ABNORMAL HIGH (ref 39–117)
BUN: 12 mg/dL (ref 6–23)
CO2: 31 mEq/L (ref 19–32)
Calcium: 9.2 mg/dL (ref 8.4–10.5)
Chloride: 104 mEq/L (ref 96–112)
Creatinine, Ser: 0.92 mg/dL (ref 0.40–1.20)
GFR: 69.86 mL/min (ref >60.00)
Glucose, Bld: 117 mg/dL — ABNORMAL HIGH (ref 70–99)
Potassium: 4.3 mEq/L (ref 3.5–5.1)
Sodium: 142 mEq/L (ref 135–145)
Total Bilirubin: 0.4 mg/dL (ref 0.2–1.2)
Total Protein: 6 g/dL (ref 6.0–8.3)

## 2020-12-15 LAB — LIPID PANEL
Cholesterol: 138 mg/dL (ref 0–200)
HDL: 50.9 mg/dL (ref >39.00)
LDL Cholesterol: 61 mg/dL (ref 0–99)
NonHDL: 87.26
Total CHOL/HDL Ratio: 3
Triglycerides: 130 mg/dL (ref 0.0–149.0)
VLDL: 26 mg/dL (ref 0.0–40.0)

## 2020-12-15 LAB — VITAMIN B12: Vitamin B-12: 189 pg/mL — ABNORMAL LOW (ref 211–911)

## 2020-12-15 LAB — TSH: TSH: 8.42 u[IU]/mL — ABNORMAL HIGH (ref 0.35–4.50)

## 2020-12-15 LAB — VITAMIN D 25 HYDROXY (VIT D DEFICIENCY, FRACTURES): VITD: 50.02 ng/mL (ref 30.00–100.00)

## 2020-12-15 NOTE — Telephone Encounter (Signed)
After speaking with Michele [name], I was instructed to ask pt to come in this morning at 10:15AM. Called pt but there was no answer, LVM to call back.

## 2020-12-15 NOTE — Progress Notes (Signed)
She presents today for follow-up of her right hallux matrixectomy was performed a week ago.  States that she fell on Monday 2 days ago and scuffed her toe as well as her left knee and her right hand.  States that she was going in to have her nails done.  Objective: Vital signs are stable she is alert and oriented x3.  Pulses are palpable.  The toe appears to be normal does not demonstrate any erythema cellulitis drainage or odor margins appear to be healing very nicely.  Assessment: Resolving matrixectomy hallux right.  Plan: I recommended she discontinue Betadine and sterile Epson salts and warm water soaks.  Continue to soak at least daily until the toe feels completely normal.  She will cover during the day but leave open at nighttime.  I will follow-up with her next week if necessary and we will follow-up with the plan fasciitis as well

## 2020-12-15 NOTE — Telephone Encounter (Signed)
Pt called in and stated that she fell Monday and her nail/toe scraped the pavement. She is still soaking it twice a day but it is now oozing and bruised and she would like advice on how to care for the toe until her nail check. Please advise.

## 2020-12-22 ENCOUNTER — Ambulatory Visit: Payer: BC Managed Care – PPO | Admitting: Podiatry

## 2020-12-22 ENCOUNTER — Encounter: Payer: Self-pay | Admitting: Family Medicine

## 2020-12-22 ENCOUNTER — Encounter: Payer: Self-pay | Admitting: Podiatry

## 2020-12-22 ENCOUNTER — Ambulatory Visit (INDEPENDENT_AMBULATORY_CARE_PROVIDER_SITE_OTHER): Payer: BC Managed Care – PPO | Admitting: Family Medicine

## 2020-12-22 ENCOUNTER — Other Ambulatory Visit: Payer: Self-pay

## 2020-12-22 ENCOUNTER — Other Ambulatory Visit: Payer: Self-pay | Admitting: Physician Assistant

## 2020-12-22 VITALS — BP 128/74 | HR 58 | Temp 97.5°F | Ht 67.5 in | Wt 207.0 lb

## 2020-12-22 DIAGNOSIS — L600 Ingrowing nail: Secondary | ICD-10-CM

## 2020-12-22 DIAGNOSIS — F3162 Bipolar disorder, current episode mixed, moderate: Secondary | ICD-10-CM

## 2020-12-22 DIAGNOSIS — Z0000 Encounter for general adult medical examination without abnormal findings: Secondary | ICD-10-CM

## 2020-12-22 DIAGNOSIS — E538 Deficiency of other specified B group vitamins: Secondary | ICD-10-CM | POA: Diagnosis not present

## 2020-12-22 DIAGNOSIS — S90111D Contusion of right great toe without damage to nail, subsequent encounter: Secondary | ICD-10-CM | POA: Diagnosis not present

## 2020-12-22 DIAGNOSIS — E782 Mixed hyperlipidemia: Secondary | ICD-10-CM

## 2020-12-22 DIAGNOSIS — R7303 Prediabetes: Secondary | ICD-10-CM

## 2020-12-22 DIAGNOSIS — Z79899 Other long term (current) drug therapy: Secondary | ICD-10-CM | POA: Diagnosis not present

## 2020-12-22 DIAGNOSIS — R7989 Other specified abnormal findings of blood chemistry: Secondary | ICD-10-CM

## 2020-12-22 DIAGNOSIS — M722 Plantar fascial fibromatosis: Secondary | ICD-10-CM | POA: Diagnosis not present

## 2020-12-22 DIAGNOSIS — K219 Gastro-esophageal reflux disease without esophagitis: Secondary | ICD-10-CM | POA: Diagnosis not present

## 2020-12-22 MED ORDER — CYANOCOBALAMIN 1000 MCG/ML IJ SOLN
1000.0000 ug | Freq: Once | INTRAMUSCULAR | Status: AC
  Administered 2020-12-22: 1000 ug via INTRAMUSCULAR

## 2020-12-22 NOTE — Assessment & Plan Note (Signed)
Lab Results  Component Value Date   TSH 8.42 (H) 12/15/2020    This is new  Has not taken biotin  Some sluggishness  Will plan to re check with FT4 in 6 wk

## 2020-12-22 NOTE — Assessment & Plan Note (Signed)
Low B12 Lab Results  Component Value Date   VITAMINB12 189 (L) 12/15/2020   Given shot today  Will take 1000 mcg oral daily  Vit D level is nl at 50

## 2020-12-22 NOTE — Progress Notes (Signed)
She presents today for follow-up of plan fasciitis left and matrixectomy hallux right.  She states that it seems to be doing much better now.  Objective: No pain on palpation medial calcaneal tubercle left heel still has some mild erythema just to the proximal nail fold particular lateral side of the hallux right but no tenderness on palpation of the margins.  Only slight tenderness on palpation of the erythematous region.  Assessment: Healing matrixectomy with trauma right.  Healing Planter fasciitis left.  Plan: Continue soak Epson salt warm water until the erythema is resolved follow-up with me in 2 weeks if not completely

## 2020-12-22 NOTE — Assessment & Plan Note (Signed)
Lab Results  Component Value Date   VITAMINB12 189 (L) 12/15/2020   b12 shot given today  inst to take 1000 mcg daily otc Re check 6 wk

## 2020-12-22 NOTE — Patient Instructions (Addendum)
If you are interested in the shingles vaccine series (Shingrix), call your insurance or pharmacy to check on coverage and location it must be given.  If affordable - you can schedule it here or at your pharmacy depending on coverage   Get the booster for covid when you can  Wait a month after that to get your shinglex vaccine   Schedule your colonoscopy appt  If you need a referral let us know   B12 shot today  Take 1000 mcg of vitamin B12 daily over the counter  Let's re check that level 6 weeks  Let's re check thyroid in 6 weeks  To prevent diabetes Try to get most of your carbohydrates from produce (with the exception of white potatoes)  Eat less bread/pasta/rice/snack foods/cereals/sweets and other items from the middle of the grocery store (processed carbs) Add an extra 30 minutes of exercise daily  Yoga or chair yoga are great

## 2020-12-22 NOTE — Assessment & Plan Note (Signed)
Lab Results  Component Value Date   HGBA1C 6.3 12/15/2020   disc imp of low glycemic diet and wt loss to prevent DM2

## 2020-12-22 NOTE — Assessment & Plan Note (Signed)
Disc goals for lipids and reasons to control them Rev last labs with pt Rev low sat fat diet in detail LDL of 61  Plan to continue atorvastatin 80 mg daily

## 2020-12-22 NOTE — Assessment & Plan Note (Signed)
Seeing psychiatry and working on titration of medications Taking lamictal and trileptal

## 2020-12-22 NOTE — Telephone Encounter (Signed)
Last apt 02/11/20 was due at 4 weeks

## 2020-12-22 NOTE — Progress Notes (Signed)
Subjective:    Patient ID: Michele [name], female    DOB: 10/13/1965, 55 y.o.   MRN: [id number]  This visit occurred during the SARS-CoV-2 public health emergency.  Safety protocols were in place, including screening questions prior to the visit, additional usage of staff PPE, and extensive cleaning of exam room while observing appropriate contact time as indicated for disinfecting solutions.   HPI Here for health maintenance exam and to review chronic medical problems    Wt Readings from Last 3 Encounters:  12/22/20 207 lb (93.9 kg)  12/02/20 210 lb (95.3 kg)  11/22/20 209 lb (94.8 kg)   31.94 kg/m Feels fair  Still fatigued   Taking care of herself   A little pain in L hip  Issues with R great toe -sees Dr Milinda Pointer /did a procedure   Zoster status-interested  Covid immunized, never got the booster /planning  Tdap 8/16 Flu shot 9/21  Colonoscopy 2/12  Is due  Has reminder to call and schedule    Mammogram 6/22 Self breast exam -non lumps   Pap 9/21  Sees gyn    GERD- takes nexium  Lab Results  Component Value Date   [id number] 119 (L) 12/15/2020  Tired  Trips and falls more often  Vit D level 29  Seeing psychiatry for mood disorder, bipolar, sees extender  Has noticed a little tremor/tick On lamictal and trileptal  Fair amt of ups and downs   Hyperlipidemia Lab Results  Component Value Date   CHOL 138 12/15/2020   CHOL 162 12/02/2020   CHOL 149 11/11/2018   Lab Results  Component Value Date   HDL 50.90 12/15/2020   HDL 60.30 12/02/2020   HDL 55 11/11/2018   Lab Results  Component Value Date   LDLCALC 61 12/15/2020   LDLCALC 78 12/02/2020   LDLCALC 76 11/11/2018   Lab Results  Component Value Date   TRIG 130.0 12/15/2020   TRIG 119.0 12/02/2020   TRIG 89 11/11/2018   Lab Results  Component Value Date   CHOLHDL 3 12/15/2020   CHOLHDL 3 12/02/2020   CHOLHDL 2.7 11/11/2018   Lab Results  Component Value Date   LDLDIRECT 202.8 03/11/2012    LDLDIRECT 198.5 02/04/2008   LDLDIRECT 171.7 10/30/2007   Taking atorvastatin 80 mg daily   Prediabetes Lab Results  Component Value Date   HGBA1C 6.3 12/15/2020  Is mindful about sugar and carbs Would like more info  Needs to exercise   TSH was elevated  Lab Results  Component Value Date   TSH 8.42 (H) 12/15/2020   Tired   No biotin lately    Patient Active Problem List   Diagnosis Date Noted   Vitamin B12 deficiency 12/22/2020   Elevated TSH 12/22/2020   Current use of proton pump inhibitor 12/14/2020   Ingrown toenail of right foot 12/02/2020   Other fatigue 12/02/2020   Onychomycosis 12/02/2020   Meralgia paresthetica 10/15/2019   Bipolar 1 disorder, mixed, moderate ([place]) 08/25/2019   Family history of DVT 08/25/2019   BPV (benign positional vertigo) 11/04/2018   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   Palpitations 08/27/2018   Shortness of breath on exertion 08/27/2018   Impetigo 08/27/2018   Screen for STD (sexually transmitted disease) 04/04/2018   Allergic rhinitis 01/10/2017   Adjustment reaction with anxious mood 08/18/2015   Arm paresthesia, right 07/14/2015   Diarrhea 10/13/2014   Rectal pressure 10/01/2013   Rectocele 10/01/2013   Mixed incontinence 04/23/2013   Encounter for routine gynecological  examination 04/01/2012   Prediabetes 04/01/2012   Routine general medical examination at a health care facility 03/11/2012   GERD 08/26/2010   History of anemia 07/19/2010   FREQUENCY, URINARY 07/06/2010   MICROSCOPIC HEMATURIA 06/09/2010   History of colonic polyps 11/15/2007   IBS 10/08/2007   ENDOMETRIOSIS 10/08/2007   Mixed hyperlipidemia 10/30/2006   Anxiety and depression 10/30/2006   HEMORRHOIDS, INTERNAL 10/30/2006   Asthma 10/30/2006   MIGRAINES, HX OF 10/30/2006   Past Medical History:  Diagnosis Date   Abnormal Pap smear of cervix    cryotx   Anxiety    Arthritis    Asthma    Depression    Endometriosis    Fatty liver     Gastric polyp    GERD (gastroesophageal reflux disease)    Hemorrhoids    Hiatal hernia    HLD (hyperlipidemia)    Hyperplastic colon polyp    IBS (irritable bowel syndrome)    Iron deficiency anemia 1/12   Pneumonia    Rectocele    Rotator cuff tear, left 2009   Syncope    Tachycardia    s/p negative cardiac work up with echo   Uterine fibroid [id number]   Past Surgical History:  Procedure Laterality Date   CARPAL TUNNEL RELEASE Bilateral    COLONOSCOPY  1999   small polyps, hemms   ESOPHAGOGASTRODUODENOSCOPY  2/12   gastric polyp and HH   HYSTEROSCOPY     LAPAROSCOPY  2006   endometriosis   ROTATOR CUFF REPAIR Left    TRIGGER FINGER RELEASE     Social History   Tobacco Use   Smoking status: Former    Packs/day: 0.25    Years: 5.00    Pack years: 1.25    Types: Cigarettes    Quit date: 07/03/1992    Years since quitting: 28.4   Smokeless tobacco: Never  Vaping Use   Vaping Use: Never used  Substance Use Topics   Alcohol use: Yes    Alcohol/week: 1.0 standard drink    Types: 1 Glasses of wine per week   Drug use: No   Family History  Problem Relation Age of Onset   Hypertension Father    Diabetes Father    Stroke Father    Migraines Father    Barrett's esophagus Father    Anemia Father        iron def   Heart disease Father        pacemaker, a fib, CHF   Bipolar disorder Father    Dementia Mother    Allergic rhinitis Mother    Diabetes Sister    Hyperlipidemia Sister    Deep vein thrombosis Sister    Anxiety disorder Daughter    Bipolar disorder Daughter    ADD / ADHD Daughter    Neuropathy Daughter    Syncope episode Daughter    Diabetes Paternal Grandfather    COPD Paternal Grandmother    Breast cancer Paternal Grandmother    Angioedema Neg Hx    Asthma Neg Hx    Atopy Neg Hx    Eczema Neg Hx    Immunodeficiency Neg Hx    Urticaria Neg Hx    Allergies  Allergen Reactions   Codeine Itching   Hydrocodone-Acetaminophen Itching   Other     Povidone-Iodine Rash        Propoxyphene N-Acetaminophen Itching        Current Outpatient Medications on File Prior to Visit  Medication Sig Dispense Refill  albuterol (VENTOLIN HFA) 108 (90 Base) MCG/ACT inhaler INHALE 2 PUFFS INTO THE LUNGS EVERY 4 HOURS AS NEEDED FOR WHEEZE 8.5 each 0   ALPRAZolam (XANAX) 0.5 MG tablet Take 0.5-1 tablets (0.25-0.5 mg total) by mouth 2 (two) times daily as needed for anxiety. 60 tablet 5   aspirin EC 81 MG tablet Take 81 mg by mouth daily.     atorvastatin (LIPITOR) 80 MG tablet Take 1 tablet (80 mg total) by mouth daily. 90 tablet 3   Biotin w/ Vitamins C & E (HAIR/SKIN/NAILS PO) Take 3 tablets by mouth daily.     CALCIUM PO Take by mouth.     cholecalciferol (VITAMIN D3) 25 MCG (1000 UNIT) tablet Take 1,000 Units by mouth daily.     diphenhydrAMINE (BENADRYL) 25 MG tablet Take 75 mg by mouth at bedtime as needed for sleep.     esomeprazole (NEXIUM) 40 MG capsule TAKE 1 CAPSULE BY MOUTH EVERY DAY IN THE MORNING 90 capsule 0   meclizine (ANTIVERT) 25 MG tablet Take 1 tablet (25 mg total) by mouth 3 (three) times daily as needed for dizziness or nausea. 30 tablet 0   meloxicam (MOBIC) 15 MG tablet Take 1 tablet (15 mg total) by mouth daily as needed for pain. With food 30 tablet 1   metoprolol tartrate (LOPRESSOR) 25 MG tablet TAKE 1 TABLET BY MOUTH 2 TIMES DAILY. 180 tablet 3   montelukast (SINGULAIR) 10 MG tablet TAKE 1 TABLET BY MOUTH EVERYDAY AT BEDTIME 90 tablet 0   naproxen sodium (ALEVE) 220 MG tablet Take 440 mg by mouth daily as needed (pain).     NEOMYCIN-POLYMYXIN-HYDROCORTISONE (CORTISPORIN) 1 % SOLN OTIC solution Apply 1-2 drops to toe BID after soaking 10 mL 1   Prenatal Vit-Fe Fumarate-FA (PRENATAL PO) Take 1 tablet by mouth daily.     triamcinolone ointment (KENALOG) 0.5 % Apply 1 application topically 2 (two) times daily. Use sparingly as needed/ directed. 30 g 0   vitamin C (ASCORBIC ACID) 500 MG tablet Take 500 mg by mouth daily.      vitamin E (VITAMIN E) 400 UNIT capsule Take 2 capsules (800 Units total) by mouth daily. 60 capsule 5   zinc gluconate 50 MG tablet Take 50 mg by mouth daily.     fluticasone (FLONASE) 50 MCG/ACT nasal spray Place 1 spray into both nostrils daily for 14 days. 16 g 0   [DISCONTINUED] norethindrone-ethinyl estradiol-iron (MICROGESTIN FE1.5/30) 1.5-30 MG-MCG tablet Take 1 tablet by mouth daily.       No current facility-administered medications on file prior to visit.    Review of Systems  Constitutional:  Positive for fatigue. Negative for activity change, appetite change, fever and unexpected weight change.  HENT:  Negative for congestion, ear pain, rhinorrhea, sinus pressure and sore throat.   Eyes:  Negative for pain, redness and visual disturbance.  Respiratory:  Negative for cough, shortness of breath and wheezing.   Cardiovascular:  Negative for chest pain and palpitations.  Gastrointestinal:  Negative for abdominal pain, blood in stool, constipation and diarrhea.  Endocrine: Negative for polydipsia and polyuria.  Genitourinary:  Negative for dysuria, frequency and urgency.  Musculoskeletal:  Positive for arthralgias. Negative for back pain and myalgias.  Skin:  Negative for pallor and rash.  Allergic/Immunologic: Negative for environmental allergies.  Neurological:  Negative for dizziness, syncope and headaches.  Hematological:  Negative for adenopathy. Does not bruise/bleed easily.  Psychiatric/Behavioral:  Positive for dysphoric mood and sleep disturbance. Negative for decreased concentration. The  patient is nervous/anxious.       Objective:   Physical Exam Constitutional:      General: She is not in acute distress.    Appearance: Normal appearance. She is well-developed. She is obese. She is not ill-appearing or diaphoretic.  HENT:     Head: Normocephalic and atraumatic.     Right Ear: Tympanic membrane, ear canal and external ear normal.     Left Ear: Tympanic membrane, ear  canal and external ear normal.     Nose: Nose normal. No congestion.     Mouth/Throat:     Mouth: Mucous membranes are moist.     Pharynx: Oropharynx is clear. No posterior oropharyngeal erythema.  Eyes:     General: No scleral icterus.    Extraocular Movements: Extraocular movements intact.     Conjunctiva/sclera: Conjunctivae normal.     Pupils: Pupils are equal, round, and reactive to light.  Neck:     Thyroid: No thyromegaly.     Vascular: No carotid bruit or JVD.  Cardiovascular:     Rate and Rhythm: Normal rate and regular rhythm.     Pulses: Normal pulses.     Heart sounds: Normal heart sounds.    No gallop.  Pulmonary:     Effort: Pulmonary effort is normal. No respiratory distress.     Breath sounds: Normal breath sounds. No wheezing.     Comments: Good air exch Chest:     Chest wall: No tenderness.  Abdominal:     General: Bowel sounds are normal. There is no distension or abdominal bruit.     Palpations: Abdomen is soft. There is no mass.     Tenderness: There is no abdominal tenderness.     Hernia: No hernia is present.  Genitourinary:    Comments: Breast and pelvic exam done by gyn Musculoskeletal:        General: No tenderness. Normal range of motion.     Cervical back: Normal range of motion and neck supple. No rigidity. No muscular tenderness.     Right lower leg: No edema.     Left lower leg: No edema.  Lymphadenopathy:     Cervical: No cervical adenopathy.  Skin:    General: Skin is warm and dry.     Coloration: Skin is not pale.     Findings: No erythema or rash.     Comments: Solar lentigines diffusely  R great toe-swelling s/p procedure ingrown  Neurological:     Mental Status: She is alert. Mental status is at baseline.     Cranial Nerves: No cranial nerve deficit.     Motor: No abnormal muscle tone.     Coordination: Coordination normal.     Gait: Gait normal.     Deep Tendon Reflexes: Reflexes are normal and symmetric. Reflexes normal.   Psychiatric:        Mood and Affect: Mood normal.        Cognition and Memory: Cognition and memory normal.          Assessment & Plan:   Problem List Items Addressed This Visit       Digestive   GERD     Musculoskeletal and Integument   Ingrown toenail of right foot    Healing after proceedure         Other   Mixed hyperlipidemia    Disc goals for lipids and reasons to control them Rev last labs with pt Rev low sat fat diet in detail LDL  of 21  Plan to continue atorvastatin 80 mg daily       Routine general medical examination at a health care facility - Primary    Reviewed health habits including diet and exercise and skin cancer prevention Reviewed appropriate screening tests for age  Also reviewed health mt list, fam hx and immunization status , as well as social and family history   See HPI Labs reviewed  Interested in shingrix vaccine  Planning covid booster  Due for colonoscopy-pt wants to schedule this herself  utd gyn care  Will start tx of low B12 Mammogram is utd       Prediabetes    Lab Results  Component Value Date   HGBA1C 6.3 12/15/2020  disc imp of low glycemic diet and wt loss to prevent DM2        Bipolar 1 disorder, mixed, moderate ([place])    Seeing psychiatry and working on titration of medications Taking lamictal and trileptal         Current use of proton pump inhibitor    Low B12 Lab Results  Component Value Date   VITAMINB12 189 (L) 12/15/2020   Given shot today  Will take 1000 mcg oral daily  Vit D level is nl at 50       Vitamin B12 deficiency    Lab Results  Component Value Date   VITAMINB12 189 (L) 12/15/2020  b12 shot given today  inst to take 1000 mcg daily otc Re check 6 wk       Relevant Orders   Vitamin B12   Elevated TSH    Lab Results  Component Value Date   TSH 8.42 (H) 12/15/2020    This is new  Has not taken biotin  Some sluggishness  Will plan to re check with FT4 in 6 wk        Relevant Orders   TSH   T4, free

## 2020-12-22 NOTE — Assessment & Plan Note (Signed)
Reviewed health habits including diet and exercise and skin cancer prevention Reviewed appropriate screening tests for age  Also reviewed health mt list, fam hx and immunization status , as well as social and family history   See HPI Labs reviewed  Interested in shingrix vaccine  Planning covid booster  Due for colonoscopy-pt wants to schedule this herself  utd gyn care  Will start tx of low B12 Mammogram is utd

## 2020-12-22 NOTE — Assessment & Plan Note (Signed)
Healing after proceedure

## 2020-12-28 ENCOUNTER — Ambulatory Visit: Payer: BC Managed Care – PPO | Admitting: Psychology

## 2021-01-11 ENCOUNTER — Ambulatory Visit: Payer: BC Managed Care – PPO | Admitting: Psychology

## 2021-01-24 ENCOUNTER — Encounter: Payer: Self-pay | Admitting: Internal Medicine

## 2021-01-25 ENCOUNTER — Ambulatory Visit: Payer: BC Managed Care – PPO | Admitting: Psychology

## 2021-01-31 ENCOUNTER — Ambulatory Visit: Payer: BC Managed Care – PPO | Admitting: Podiatry

## 2021-02-01 ENCOUNTER — Encounter: Payer: Self-pay | Admitting: Radiology

## 2021-02-02 ENCOUNTER — Other Ambulatory Visit: Payer: BC Managed Care – PPO

## 2021-02-07 ENCOUNTER — Other Ambulatory Visit: Payer: Self-pay | Admitting: Family Medicine

## 2021-03-04 ENCOUNTER — Other Ambulatory Visit: Payer: Self-pay | Admitting: Family Medicine

## 2021-03-04 ENCOUNTER — Encounter: Payer: Self-pay | Admitting: Family Medicine

## 2021-03-04 DIAGNOSIS — J4520 Mild intermittent asthma, uncomplicated: Secondary | ICD-10-CM

## 2021-04-02 ENCOUNTER — Other Ambulatory Visit: Payer: Self-pay | Admitting: Physician Assistant

## 2021-04-05 NOTE — Telephone Encounter (Signed)
Please schedule appt

## 2021-04-07 NOTE — Telephone Encounter (Signed)
Has apt 10/18

## 2021-04-13 NOTE — Telephone Encounter (Signed)
90 day ok?

## 2021-04-19 ENCOUNTER — Ambulatory Visit (INDEPENDENT_AMBULATORY_CARE_PROVIDER_SITE_OTHER): Payer: Self-pay | Admitting: Physician Assistant

## 2021-04-19 ENCOUNTER — Encounter: Payer: Self-pay | Admitting: Physician Assistant

## 2021-04-19 DIAGNOSIS — G4700 Insomnia, unspecified: Secondary | ICD-10-CM

## 2021-04-19 DIAGNOSIS — F4321 Adjustment disorder with depressed mood: Secondary | ICD-10-CM

## 2021-04-19 DIAGNOSIS — F319 Bipolar disorder, unspecified: Secondary | ICD-10-CM

## 2021-04-19 DIAGNOSIS — F411 Generalized anxiety disorder: Secondary | ICD-10-CM

## 2021-04-19 MED ORDER — LAMOTRIGINE 100 MG PO TABS: 300.0000 mg | ORAL_TABLET | Freq: Every day | ORAL | 0 refills | Status: DC

## 2021-04-19 MED ORDER — OXCARBAZEPINE 600 MG PO TABS: ORAL_TABLET | ORAL | 0 refills | Status: DC
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
## 2021-07-05 ENCOUNTER — Other Ambulatory Visit: Payer: Self-pay | Admitting: Adult Health

## 2021-07-05 ENCOUNTER — Other Ambulatory Visit: Payer: Self-pay | Admitting: Physician Assistant

## 2021-07-05 ENCOUNTER — Other Ambulatory Visit: Payer: Self-pay | Admitting: Internal Medicine

## 2021-07-05 DIAGNOSIS — L237 Allergic contact dermatitis due to plants, except food: Secondary | ICD-10-CM

## 2021-07-07 NOTE — Telephone Encounter (Signed)
Please schedule appt

## 2021-07-08 NOTE — Telephone Encounter (Signed)
Last seen 10/18 do you want to send with no appt?

## 2021-07-08 NOTE — Telephone Encounter (Signed)
Patient will call back to schedule follow up

## 2021-07-22 ENCOUNTER — Other Ambulatory Visit: Payer: Self-pay | Admitting: Physician Assistant

## 2021-08-19 ENCOUNTER — Other Ambulatory Visit: Payer: Self-pay | Admitting: Physician Assistant

## 2021-08-22 NOTE — Telephone Encounter (Signed)
Last seen 10/18 with RTC in 4-6 weeks. No appt.

## 2021-09-02 ENCOUNTER — Other Ambulatory Visit: Payer: Self-pay | Admitting: Internal Medicine

## 2021-09-05 ENCOUNTER — Telehealth: Payer: Self-pay | Admitting: Physician Assistant

## 2021-09-07 ENCOUNTER — Encounter: Payer: Self-pay | Admitting: Family Medicine

## 2021-09-07 DIAGNOSIS — F4322 Adjustment disorder with anxiety: Secondary | ICD-10-CM

## 2021-09-07 DIAGNOSIS — F419 Anxiety disorder, unspecified: Secondary | ICD-10-CM

## 2021-09-12 NOTE — Telephone Encounter (Signed)
I sent an external psychiatry referral  ?Would someone please send it to [facility]  office ? ? ?Thanks  ? ? ?

## 2021-09-21 NOTE — Telephone Encounter (Signed)
Michele [name] with Dr Nicolasa Ducking office called stating that they can't take pt because pt has a collection. ?

## 2021-10-03 ENCOUNTER — Other Ambulatory Visit: Payer: Self-pay | Admitting: Internal Medicine

## 2021-10-27 ENCOUNTER — Other Ambulatory Visit: Payer: Self-pay | Admitting: Internal Medicine

## 2021-11-25 ENCOUNTER — Other Ambulatory Visit: Payer: Self-pay | Admitting: Internal Medicine

## 2021-12-10 ENCOUNTER — Other Ambulatory Visit: Payer: Self-pay | Admitting: Internal Medicine

## 2021-12-30 ENCOUNTER — Other Ambulatory Visit: Payer: Self-pay | Admitting: Family Medicine

## 2021-12-30 ENCOUNTER — Encounter: Payer: Self-pay | Admitting: Family Medicine

## 2021-12-30 DIAGNOSIS — J4520 Mild intermittent asthma, uncomplicated: Secondary | ICD-10-CM

## 2022-01-01 ENCOUNTER — Telehealth: Payer: BC Managed Care – PPO | Admitting: Nurse Practitioner

## 2022-01-01 DIAGNOSIS — L247 Irritant contact dermatitis due to plants, except food: Secondary | ICD-10-CM | POA: Diagnosis not present

## 2022-01-01 MED ORDER — PREDNISONE 10 MG (21) PO TBPK: ORAL_TABLET | ORAL | 0 refills | Status: DC

## 2022-01-01 NOTE — Progress Notes (Signed)
Virtual Visit Consent   Michele [name], you are scheduled for a virtual visit with Mary-Margaret Hassell Done, [place], a [facility] provider, today.     Just as with appointments in the office, your consent must be obtained to participate.  Your consent will be active for this visit and any virtual visit you may have with one of our providers in the next 365 days.     If you have a MyChart account, a copy of this consent can be sent to you electronically.  All virtual visits are billed to your insurance company just like a traditional visit in the office.    As this is a virtual visit, video technology does not allow for your provider to perform a traditional examination.  This may limit your provider's ability to fully assess your condition.  If your provider identifies any concerns that need to be evaluated in person or the need to arrange testing (such as labs, EKG, etc.), we will make arrangements to do so.     Although advances in technology are sophisticated, we cannot ensure that it will always work on either your end or our end.  If the connection with a video visit is poor, the visit may have to be switched to a telephone visit.  With either a video or telephone visit, we are not always able to ensure that we have a secure connection.     I need to obtain your verbal consent now.   Are you willing to proceed with your visit today? YES   Wilena Malanga has provided verbal consent on 01/01/2022 for a virtual visit (video or telephone).   Mary-Margaret Hassell Done, FNP   Date: 01/01/2022 5:01 PM   Virtual Visit via Video Note   I, Mary-Margaret Hassell Done, connected with Michele [name] ([id number], 1966/05/29) on 01/01/22 at  5:00 PM EDT by a video-enabled telemedicine application and verified that I am speaking with the correct person using two identifiers.  Location: Patient: Virtual Visit Location Patient: Home Provider: Virtual Visit Location Provider: Mobile   I discussed the limitations of  evaluation and management by telemedicine and the availability of in person appointments. The patient expressed understanding and agreed to proceed.    History of Present Illness: Michele [name] is a 56 y.o. who identifies as a female who was assigned female at birth, and is being seen today for poison oak.  HPI: Patient says she was doing yard work on Wednesday and Thursday . She now has itchy oozing rash on right shoulder. Is spreading.    Review of Systems  Constitutional:  Negative for diaphoresis and weight loss.  Eyes:  Negative for blurred vision, double vision and pain.  Respiratory:  Negative for shortness of breath.   Cardiovascular:  Negative for chest pain, palpitations, orthopnea and leg swelling.  Gastrointestinal:  Negative for abdominal pain.  Skin:  Negative for rash.  Neurological:  Negative for dizziness, sensory change, loss of consciousness, weakness and headaches.  Endo/Heme/Allergies:  Negative for polydipsia. Does not bruise/bleed easily.  Psychiatric/Behavioral:  Negative for memory loss. The patient does not have insomnia.   All other systems reviewed and are negative.   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   Palpitations 08/27/2018   Shortness of breath on exertion 08/27/2018   Impetigo 08/27/2018   Screen for STD (sexually transmitted disease) 04/04/2018   Allergic rhinitis 01/10/2017   Adjustment reaction with anxious mood 08/18/2015   Arm paresthesia, right 07/14/2015   Diarrhea 10/13/2014   Rectal pressure  10/01/2013   Rectocele 10/01/2013   Mixed incontinence 04/23/2013   Encounter for routine gynecological examination 04/01/2012   Prediabetes 04/01/2012   Routine general medical examination at a health care facility 03/11/2012   GERD 08/26/2010   History of anemia 07/19/2010   FREQUENCY, URINARY 07/06/2010   MICROSCOPIC HEMATURIA 06/09/2010   History of colonic polyps 11/15/2007   IBS 10/08/2007   ENDOMETRIOSIS 10/08/2007   Mixed hyperlipidemia 10/30/2006   Anxiety and depression 10/30/2006   HEMORRHOIDS, INTERNAL 10/30/2006   Asthma 10/30/2006   MIGRAINES, HX OF 10/30/2006    Allergies:  Allergies  Allergen Reactions   Codeine Itching   Hydrocodone-Acetaminophen Itching   Other    Povidone-Iodine Rash        Propoxyphene N-Acetaminophen Itching        Medications:  Current Outpatient Medications:    albuterol (VENTOLIN HFA) 108 (90 Base) MCG/ACT inhaler, INHALE 2 PUFFS INTO THE LUNGS EVERY 4 HOURS AS NEEDED FOR WHEEZE, Disp: 8.5 each, Rfl: 0   ALPRAZolam (XANAX) 0.5 MG tablet, Take 0.5-1 tablets (0.25-0.5 mg total) by mouth 2 (two) times daily as needed for anxiety., Disp: 60 tablet, Rfl: 5   aspirin EC 81 MG tablet, Take 81 mg by mouth daily., Disp: , Rfl:    atorvastatin (LIPITOR) 80 MG tablet, Take 1 tablet (80 mg total) by mouth daily. SCHEDULE OFFICE VISIT FOR FUTURE REFILLS., Disp: 30 tablet, Rfl: 0   busPIRone (BUSPAR) 30 MG tablet, TAKE 1 TABLET BY MOUTH TWICE A DAY, Disp: 60 tablet, Rfl: 0   CALCIUM PO, Take by mouth., Disp: , Rfl:    cholecalciferol (VITAMIN D3) 25 MCG (1000 UNIT) tablet, Take 1,000 Units by mouth daily., Disp: , Rfl:    diphenhydrAMINE (BENADRYL) 25 MG tablet, Take 75 mg by mouth at bedtime as needed for sleep., Disp: , Rfl:    esomeprazole (NEXIUM) 40 MG capsule, TAKE 1 CAPSULE BY MOUTH EVERY DAY IN THE MORNING, Disp: 90 capsule, Rfl: 0   fluticasone (FLONASE) 50 MCG/ACT nasal spray, Place 1 spray into both nostrils daily for 14 days., Disp: 16  g, Rfl: 0   lamoTRIgine (LAMICTAL) 100 MG tablet, Take 3 tablets (300 mg total) by mouth daily., Disp: 90 tablet, Rfl: 0   meclizine (ANTIVERT) 25 MG tablet, Take 1 tablet (25 mg total) by mouth 3 (three) times daily as needed for dizziness or nausea., Disp: 30 tablet, Rfl: 0   meloxicam (MOBIC) 15 MG tablet, Take 1 tablet (15 mg total) by mouth daily as needed for pain. With food (Patient not taking: Reported on 04/19/2021), Disp: 30 tablet, Rfl: 1   metoprolol tartrate (LOPRESSOR) 25 MG tablet, TAKE 1 TABLET BY MOUTH TWICE A DAY, Disp: 180 tablet, Rfl: 3   montelukast (SINGULAIR) 10 MG tablet, TAKE 1 TABLET BY MOUTH EVERYDAY AT BEDTIME, Disp: 90 tablet, Rfl: 0   naproxen sodium (ALEVE) 220 MG tablet, Take 440 mg by mouth daily as needed (pain)., Disp: , Rfl:    NEOMYCIN-POLYMYXIN-HYDROCORTISONE (CORTISPORIN) 1 % SOLN OTIC solution, Apply 1-2 drops to toe BID  after soaking, Disp: 10 mL, Rfl: 1   oxcarbazepine (TRILEPTAL) 600 MG tablet, 1/2 p.o. every morning and 1 p.o. nightly., Disp: 45 tablet, Rfl: 0   Prenatal Vit-Fe Fumarate-FA (PRENATAL PO), Take 1 tablet by mouth daily., Disp: , Rfl:    triamcinolone ointment (KENALOG) 0.5 %, Apply 1 application topically 2 (two) times daily. Use sparingly as needed/ directed., Disp: 30 g, Rfl: 0   vitamin C (ASCORBIC ACID) 500 MG tablet, Take 500 mg by mouth daily., Disp: , Rfl:    vitamin E (VITAMIN E) 400 UNIT capsule, Take 2 capsules (800 Units total) by mouth daily., Disp: 60 capsule, Rfl: 5   zinc gluconate 50 MG tablet, Take 50 mg by mouth daily., Disp: , Rfl:   Observations/Objective: Patient is well-developed, well-nourished in no acute distress.  Resting comfortably  at home.  Head is normocephalic, atraumatic.  No labored breathing.  Speech is clear and coherent with logical content.  Patient is alert and oriented at baseline.  Vesicular lesion on right shoulder- some erythema and oozing noted  Assessment and Plan:  Michele [name] in today  with chief complaint of Dermatitis   1. Irritant contact dermatitis due to plants, except food Cool compresses Calamine lotion No scratching Watch for signs of infection If not improving need Face to Face visit.  Meds ordered this encounter  Medications   predniSONE (STERAPRED UNI-PAK 21 TAB) 10 MG (21) TBPK tablet    Sig: As directed x 6 days    Dispense:  21 tablet    Refill:  0    Order Specific Question:   Supervising Provider    Answer:   Noemi Chapel [[id number]]      Follow Up Instructions: I discussed the assessment and treatment plan with the patient. The patient was provided an opportunity to ask questions and all were answered. The patient agreed with the plan and demonstrated an understanding of the instructions.  A copy of instructions were sent to the patient via MyChart.  The patient was advised to call back or seek an in-person evaluation if the symptoms worsen or if the condition fails to improve as anticipated.  Time:  I spent 8 minutes with the patient via telehealth technology discussing the above problems/concerns.    Mary-Margaret Hassell Done, FNP

## 2022-01-01 NOTE — Patient Instructions (Signed)
Poison Ivy Dermatitis Poison ivy dermatitis is inflammation of the skin that is caused by chemicals in the leaves of the poison ivy plant. The skin reaction often involves redness, swelling, blisters, and extreme itching. What are the causes? This condition is caused by a chemical (urushiol) found in the sap of the poison ivy plant. This chemical is sticky and can be easily spread to people, animals, and objects. You can get poison ivy dermatitis by: Having direct contact with a poison ivy plant. Touching animals, other people, or objects that have come in contact with poison ivy and have the chemical on them. What increases the risk? This condition is more likely to develop in people who: Are outdoors often in wooded or [place] areas. Go outdoors without wearing protective clothing, such as closed shoes, long pants, and a long-sleeved shirt. What are the signs or symptoms? Symptoms of this condition include: Redness of the skin. Extreme itching. A rash that often includes bumps and blisters. The rash usually appears 48 hours after exposure, if you have been exposed before. If this is the first time you have been exposed, the rash may not appear until a week after exposure. Swelling. This may occur if the reaction is more severe. Symptoms usually last for 1-2 weeks. However, the first time you develop this condition, symptoms may last 3-4 weeks. How is this diagnosed? This condition may be diagnosed based on your symptoms and a physical exam. Your health care provider may also ask you about any recent outdoor activity. How is this treated? Treatment for this condition will vary depending on how severe it is. Treatment may include: Hydrocortisone cream or calamine lotion to relieve itching. Oatmeal baths to soothe the skin. Medicines, such as over-the-counter antihistamine tablets. Oral steroid medicine, for more severe reactions. Follow these instructions at home: Medicines Take or apply  over-the-counter and prescription medicines only as told by your health care provider. Use hydrocortisone cream or calamine lotion as needed to soothe the skin and relieve itching. General instructions Do not scratch or rub your skin. Apply a cold, wet cloth (cold compress) to the affected areas or take baths in cool water. This will help with itching. Avoid hot baths and showers. Take oatmeal baths as needed. Use colloidal oatmeal. You can get this at your local pharmacy or grocery store. Follow the instructions on the packaging. While you have the rash, wash clothes right after you wear them. Keep all follow-up visits as told by your health care provider. This is important. How is this prevented?  Learn to identify the poison ivy plant and avoid contact with the plant. This plant can be recognized by the number of leaves. Generally, poison ivy has three leaves with flowering branches on a single stem. The leaves are typically glossy, and they have jagged edges that come to a point at the front. If you have been exposed to poison ivy, thoroughly wash with soap and water right away. You have about 30 minutes to remove the plant resin before it will cause the rash. Be sure to wash under your fingernails, because any plant resin there will continue to spread the rash. When hiking or camping, wear clothes that will help you to avoid exposure on the skin. This includes long pants, a long-sleeved shirt, tall socks, and hiking boots. You can also apply preventive lotion to your skin to help limit exposure. If you suspect that your clothes or outdoor gear came in contact with poison ivy, rinse them off outside  with a garden hose before you bring them inside your house. When doing yard work or gardening, wear gloves, long sleeves, long pants, and boots. Wash your garden tools and gloves if they come in contact with poison ivy. If you suspect that your pet has come into contact with poison ivy, wash him or her  with pet shampoo and water. Make sure to wear gloves while washing your pet. Contact a health care provider if you have: Open sores in the rash area. More redness, swelling, or pain in the affected area. Redness that spreads beyond the rash area. Fluid, blood, or pus coming from the affected area. A fever. A rash over a large area of your body. A rash on your eyes, mouth, or genitals. A rash that does not improve after a few weeks. Get help right away if: Your face swells or your eyes swell shut. You have trouble breathing. You have trouble swallowing. These symptoms may represent a serious problem that is an emergency. Do not wait to see if the symptoms will go away. Get medical help right away. Call your local emergency services (911 in the [place] Do not drive yourself to the hospital. Summary Poison ivy dermatitis is inflammation of the skin that is caused by chemicals in the leaves of the poison ivy plant. Symptoms of this condition include redness, itching, a rash, and swelling. Do not scratch or rub your skin. Take or apply over-the-counter and prescription medicines only as told by your health care provider. This information is not intended to replace advice given to you by your health care provider. Make sure you discuss any questions you have with your health care provider. Document Revised: 04/04/2021 Document Reviewed: 04/04/2021 Elsevier Patient Education  [place].

## 2022-01-06 ENCOUNTER — Other Ambulatory Visit: Payer: Self-pay | Admitting: Internal Medicine

## 2022-01-16 ENCOUNTER — Telehealth: Payer: Self-pay | Admitting: Internal Medicine

## 2022-01-16 NOTE — Telephone Encounter (Signed)
*  STAT* If patient is at the pharmacy, call can be transferred to refill team.   1. Which medications need to be refilled? (please list name of each medication and dose if known) atorvastatin (LIPITOR) 80 MG tablet  2. Which pharmacy/location (including street and city if local pharmacy) is medication to be sent to?[id number]- [place], [place] - [address]  3. Do they need a 30 day or 90 day supply? 90  Pt made an appt for 05/18/22 w/ Dr. HDebara Pickett

## 2022-01-20 ENCOUNTER — Other Ambulatory Visit: Payer: Self-pay | Admitting: Internal Medicine

## 2022-01-26 ENCOUNTER — Telehealth: Payer: Self-pay | Admitting: Family Medicine

## 2022-01-26 DIAGNOSIS — Z79899 Other long term (current) drug therapy: Secondary | ICD-10-CM

## 2022-01-26 DIAGNOSIS — R7303 Prediabetes: Secondary | ICD-10-CM

## 2022-01-26 DIAGNOSIS — Z0000 Encounter for general adult medical examination without abnormal findings: Secondary | ICD-10-CM

## 2022-01-26 DIAGNOSIS — R7989 Other specified abnormal findings of blood chemistry: Secondary | ICD-10-CM

## 2022-01-26 DIAGNOSIS — E538 Deficiency of other specified B group vitamins: Secondary | ICD-10-CM

## 2022-01-26 DIAGNOSIS — E782 Mixed hyperlipidemia: Secondary | ICD-10-CM

## 2022-01-26 NOTE — Telephone Encounter (Signed)
-----   Message from Ellamae Sia sent at 01/16/2022 12:07 PM EDT ----- Regarding: Lab orders for Friday, 7.28.23 Patient is scheduled for CPX labs, please order future labs, Thanks , Karna Christmas

## 2022-01-27 ENCOUNTER — Other Ambulatory Visit (INDEPENDENT_AMBULATORY_CARE_PROVIDER_SITE_OTHER): Payer: BC Managed Care – PPO

## 2022-01-27 DIAGNOSIS — E782 Mixed hyperlipidemia: Secondary | ICD-10-CM

## 2022-01-27 DIAGNOSIS — E538 Deficiency of other specified B group vitamins: Secondary | ICD-10-CM | POA: Diagnosis not present

## 2022-01-27 DIAGNOSIS — R7989 Other specified abnormal findings of blood chemistry: Secondary | ICD-10-CM | POA: Diagnosis not present

## 2022-01-27 DIAGNOSIS — Z0000 Encounter for general adult medical examination without abnormal findings: Secondary | ICD-10-CM

## 2022-01-27 DIAGNOSIS — R7303 Prediabetes: Secondary | ICD-10-CM

## 2022-01-27 LAB — CBC WITH DIFFERENTIAL/PLATELET
Basophils Absolute: 0 10*3/uL (ref 0.0–0.1)
Basophils Relative: 0.5 % (ref 0.0–3.0)
Eosinophils Absolute: 0.2 10*3/uL (ref 0.0–0.7)
Eosinophils Relative: 3.1 % (ref 0.0–5.0)
HCT: 40.1 % (ref 36.0–46.0)
Hemoglobin: 13.6 g/dL (ref 12.0–15.0)
Lymphocytes Relative: 30.2 % (ref 12.0–46.0)
Lymphs Abs: 2.2 10*3/uL (ref 0.7–4.0)
MCHC: 34 g/dL (ref 30.0–36.0)
MCV: 90.5 fl (ref 78.0–100.0)
Monocytes Absolute: 0.6 10*3/uL (ref 0.1–1.0)
Monocytes Relative: 8.2 % (ref 3.0–12.0)
Neutro Abs: 4.1 10*3/uL (ref 1.4–7.7)
Neutrophils Relative %: 58 % (ref 43.0–77.0)
Platelets: 277 10*3/uL (ref 150.0–400.0)
RBC: 4.43 Mil/uL (ref 3.87–5.11)
RDW: 13.8 % (ref 11.5–15.5)
WBC: 7.1 10*3/uL (ref 4.0–10.5)

## 2022-01-27 LAB — COMPREHENSIVE METABOLIC PANEL
ALT: 28 U/L (ref 0–35)
AST: 19 U/L (ref 0–37)
Albumin: 4.5 g/dL (ref 3.5–5.2)
Alkaline Phosphatase: 129 U/L — ABNORMAL HIGH (ref 39–117)
BUN: 10 mg/dL (ref 6–23)
CO2: 31 mEq/L (ref 19–32)
Calcium: 9.9 mg/dL (ref 8.4–10.5)
Chloride: 102 mEq/L (ref 96–112)
Creatinine, Ser: 0.98 mg/dL (ref 0.40–1.20)
GFR: 64.25 mL/min (ref >60.00)
Glucose, Bld: 147 mg/dL — ABNORMAL HIGH (ref 70–99)
Potassium: 4.4 mEq/L (ref 3.5–5.1)
Sodium: 143 mEq/L (ref 135–145)
Total Bilirubin: 0.9 mg/dL (ref 0.2–1.2)
Total Protein: 6.5 g/dL (ref 6.0–8.3)

## 2022-01-27 LAB — TSH: TSH: 6.89 u[IU]/mL — ABNORMAL HIGH (ref 0.35–5.50)

## 2022-01-27 LAB — LIPID PANEL
Cholesterol: 148 mg/dL (ref 0–200)
HDL: 47.4 mg/dL (ref >39.00)
LDL Cholesterol: 78 mg/dL (ref 0–99)
NonHDL: 100.73
Total CHOL/HDL Ratio: 3
Triglycerides: 115 mg/dL (ref 0.0–149.0)
VLDL: 23 mg/dL (ref 0.0–40.0)

## 2022-01-27 LAB — T4, FREE: Free T4: 0.89 ng/dL (ref 0.60–1.60)

## 2022-01-27 LAB — HEMOGLOBIN A1C: Hgb A1c MFr Bld: 6.5 % (ref 4.6–6.5)

## 2022-01-27 LAB — VITAMIN B12: Vitamin B-12: >1500 pg/mL — ABNORMAL HIGH (ref 211–911)

## 2022-01-31 ENCOUNTER — Ambulatory Visit (INDEPENDENT_AMBULATORY_CARE_PROVIDER_SITE_OTHER): Payer: BC Managed Care – PPO | Admitting: Family Medicine

## 2022-01-31 ENCOUNTER — Encounter: Payer: Self-pay | Admitting: Family Medicine

## 2022-01-31 VITALS — BP 110/74 | HR 62 | Ht 67.0 in | Wt 205.0 lb

## 2022-01-31 DIAGNOSIS — K219 Gastro-esophageal reflux disease without esophagitis: Secondary | ICD-10-CM | POA: Diagnosis not present

## 2022-01-31 DIAGNOSIS — Z0000 Encounter for general adult medical examination without abnormal findings: Secondary | ICD-10-CM

## 2022-01-31 DIAGNOSIS — Z1211 Encounter for screening for malignant neoplasm of colon: Secondary | ICD-10-CM

## 2022-01-31 DIAGNOSIS — R7303 Prediabetes: Secondary | ICD-10-CM

## 2022-01-31 DIAGNOSIS — F32A Depression, unspecified: Secondary | ICD-10-CM

## 2022-01-31 DIAGNOSIS — F3162 Bipolar disorder, current episode mixed, moderate: Secondary | ICD-10-CM

## 2022-01-31 DIAGNOSIS — F419 Anxiety disorder, unspecified: Secondary | ICD-10-CM

## 2022-01-31 DIAGNOSIS — E782 Mixed hyperlipidemia: Secondary | ICD-10-CM

## 2022-01-31 DIAGNOSIS — E538 Deficiency of other specified B group vitamins: Secondary | ICD-10-CM

## 2022-01-31 DIAGNOSIS — R7989 Other specified abnormal findings of blood chemistry: Secondary | ICD-10-CM

## 2022-01-31 DIAGNOSIS — Z1231 Encounter for screening mammogram for malignant neoplasm of breast: Secondary | ICD-10-CM

## 2022-01-31 DIAGNOSIS — Z79899 Other long term (current) drug therapy: Secondary | ICD-10-CM

## 2022-01-31 NOTE — Assessment & Plan Note (Signed)
Lab Results  Component Value Date   TSH 6.89 (H) 01/27/2022   Taking lithium FT4 still in nl range  Will continue to watch

## 2022-01-31 NOTE — Assessment & Plan Note (Signed)
Unable to wean ppi so far Taking vit B12

## 2022-01-31 NOTE — Assessment & Plan Note (Signed)
Under psychiatric care for this with bipolar

## 2022-01-31 NOTE — Assessment & Plan Note (Signed)
Disc goals for lipids and reasons to control them Rev last labs with pt Rev low sat fat diet in detail Taking atorvastatin 80 mg and tolerating well

## 2022-01-31 NOTE — Assessment & Plan Note (Signed)
Under new psychiatrist and replaced other meds with lithium This is helping more  Getting labs at her pych office as well  Commended  Reviewed stressors/ coping techniques/symptoms/ support sources/ tx options and side effects in detail today

## 2022-01-31 NOTE — Progress Notes (Signed)
Subjective:    Patient ID: Michele [name], female    DOB: 05/17/1966, 56 y.o.   MRN: [id number]  HPI Here for health maintenance exam and to review chronic medical problems    Wt Readings from Last 3 Encounters:  01/31/22 205 lb (93 kg)  12/22/20 207 lb (93.9 kg)  12/02/20 210 lb (95.3 kg)   32.11 kg/m  Doing ok  Surviving for the most part  Father lives with her -has lewy body dementia and req care 24 h a day  She does not get a lot of sleep Gets a little help from [place] [facility]/ trying to get caregiver help right now  Husband helps some also   No time for self care   Immunization History  Administered Date(s) Administered   Influenza Split 04/01/2012   Influenza Whole 04/18/2005, 04/30/2008, 04/07/2010   Influenza,inj,Quad PF,6+ Mos 04/23/2013, 03/02/2015, 04/04/2016, 04/04/2018, 04/08/2019, 03/25/2020   Influenza-Unspecified 04/07/2017   PFIZER(Purple Top)SARS-COV-2 Vaccination 10/20/2019, 11/12/2019   Pneumococcal Polysaccharide-23 04/30/2008   Rabies, IM 07/21/2016, 07/24/2016, 07/28/2016, 08/04/2016   Td 10/02/2003   Tdap 03/02/2015   Health Maintenance Due  Topic Date Due   Zoster Vaccines- Shingrix (1 of 2) Never done   COVID-19 Vaccine (3 - Pfizer risk series) 12/10/2019   COLONOSCOPY (Pts 45-70yr Insurance coverage will need to be confirmed)  08/17/2020   MAMMOGRAM  12/06/2021   INFLUENZA VACCINE  01/31/2022   Zoster vaccine: plans to get soon / unsure where    Colonoscopy 12/2010 is due for 10 y recall Saw Dr [facility]att [facility]    Mammogram 12/2020 Self breast exam: no lumps   Pap 03/2020 nl with neg HPV screen No gyn problems  No periods  No new partners   Mood  Anxiety and depression and bipolar  Sees [place] in behavioral health , [place] for [place]  She is doing better off all the other medicines  No longer falling / gait is better and less sedated  Going to sleep easier     PHQ    01/31/2022   11:09 AM 11/11/2020    9:13 AM 08/25/2019    9:29 AM 08/28/2018    6:51 PM 04/04/2018    9:20 AM  Depression screen PHQ 2/9  Decreased Interest 0 0 '1 2 2  '$ Down, Depressed, Hopeless 0 0 '1 3 2  '$ PHQ - 2 Score 0 0 '2 5 4  '$ Altered sleeping   '1 3 3  '$ Tired, decreased energy   '1 3 3  '$ Change in appetite   '1 2 3  '$ Feeling bad or failure about yourself    '1 3 3  '$ Trouble concentrating   '1 2 3  '$ Moving slowly or fidgety/restless   0 1 2  Suicidal thoughts   '1 1 1  '$ PHQ-9 Score   '8 20 22  '$ Difficult doing work/chores   Somewhat difficult     BP Readings from Last 3 Encounters:  01/31/22 110/74  12/22/20 128/74  12/02/20 130/80   Pulse Readings from Last 3 Encounters:  01/31/22 62  12/22/20 (!) 58  12/02/20 74   Takes metoprolol from cardiology     H.o elevated TSH Lab Results  Component Value Date   TSH 6.89 (H) 01/27/2022  Normal FT4 at 0.89   GERD Takes nexium Lab Results  Component Value Date   VITAMINB12 >1500 (H) 01/27/2022   Taking 1000 mcg daily  Will  cut back down to 500 mcg   Lab Results  Component Value Date   CREATININE 0.98 01/27/2022   BUN 10 01/27/2022   NA 143 01/27/2022   K 4.4 01/27/2022   CL 102 01/27/2022   CO2 31 01/27/2022    Hyperlipidemia Lab Results  Component Value Date   CHOL 148 01/27/2022   CHOL 138 12/15/2020   CHOL 162 12/02/2020   Lab Results  Component Value Date   HDL 47.40 01/27/2022   HDL 50.90 12/15/2020   HDL 60.30 12/02/2020   Lab Results  Component Value Date   LDLCALC 78 01/27/2022   LDLCALC 61 12/15/2020   LDLCALC 78 12/02/2020   Lab Results  Component Value Date   TRIG 115.0 01/27/2022   TRIG 130.0 12/15/2020   TRIG 119.0 12/02/2020   Lab Results  Component Value Date   CHOLHDL 3 01/27/2022   CHOLHDL 3 12/15/2020   CHOLHDL 3 12/02/2020   Lab Results  Component Value Date   LDLDIRECT 202.8 03/11/2012   LDLDIRECT 198.5 02/04/2008   LDLDIRECT 171.7 10/30/2007   Atorvastatin 80 mg  daily  Diet is not optimal due to schedule   Prediabetes Lab Results  Component Value Date   HGBA1C 6.5 01/27/2022   This is up from 6.3 Difficult    Other labs Lab Results  Component Value Date   ALT 28 01/27/2022   AST 19 01/27/2022   ALKPHOS 129 (H) 01/27/2022   BILITOT 0.9 01/27/2022    She has L disc dz Collapsed disc L4-L5  Does not want surgery right now   Lab Results  Component Value Date   WBC 7.1 01/27/2022   HGB 13.6 01/27/2022   HCT 40.1 01/27/2022   MCV 90.5 01/27/2022   PLT 277.0 01/27/2022   Patient Active Problem List   Diagnosis Date Noted   Colon cancer screening 01/31/2022   Encounter for screening mammogram for breast cancer 01/31/2022   Vitamin B12 deficiency 12/22/2020   Elevated TSH 12/22/2020   Current use of proton pump inhibitor 12/14/2020   Ingrown toenail of right foot 12/02/2020   Onychomycosis 12/02/2020   Meralgia paresthetica 10/15/2019   Bipolar 1 disorder, mixed, moderate ([place]) 08/25/2019   Family history of DVT 08/25/2019   BPV (benign positional vertigo) 11/04/2018   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   Shortness of breath on exertion 08/27/2018   Impetigo 08/27/2018   Allergic rhinitis 01/10/2017   Adjustment reaction with anxious mood 08/18/2015   Arm paresthesia, right 07/14/2015   Rectal pressure 10/01/2013   Rectocele 10/01/2013   Mixed incontinence 04/23/2013   Encounter for routine gynecological examination 04/01/2012   Prediabetes 04/01/2012   Routine general medical examination at a health care facility 03/11/2012   GERD 08/26/2010   History of anemia 07/19/2010   FREQUENCY, URINARY 07/06/2010   MICROSCOPIC HEMATURIA 06/09/2010   History of colonic polyps 11/15/2007   IBS 10/08/2007   ENDOMETRIOSIS 10/08/2007   Mixed hyperlipidemia 10/30/2006   Anxiety and depression 10/30/2006   HEMORRHOIDS, INTERNAL 10/30/2006   Asthma 10/30/2006   MIGRAINES, HX OF 10/30/2006   Past Medical History:   Diagnosis Date   Abnormal Pap smear of cervix    cryotx   Anxiety    Arthritis    Asthma    Depression    Endometriosis    Fatty liver    Gastric polyp    GERD (gastroesophageal reflux disease)    Hemorrhoids    Hiatal hernia    HLD (hyperlipidemia)  Hyperplastic colon polyp    IBS (irritable bowel syndrome)    Iron deficiency anemia 1/12   Pneumonia    Rectocele    Rotator cuff tear, left 2009   Syncope    Tachycardia    s/p negative cardiac work up with echo   Uterine fibroid 185631   Past Surgical History:  Procedure Laterality Date   CARPAL TUNNEL RELEASE Bilateral    COLONOSCOPY  1999   small polyps, hemms   ESOPHAGOGASTRODUODENOSCOPY  2/12   gastric polyp and HH   HYSTEROSCOPY     LAPAROSCOPY  2006   endometriosis   ROTATOR CUFF REPAIR Left    TRIGGER FINGER RELEASE     Social History   Tobacco Use   Smoking status: Former    Packs/day: 0.25    Years: 5.00    Total pack years: 1.25    Types: Cigarettes    Quit date: 07/03/1992    Years since quitting: 29.6   Smokeless tobacco: Never  Vaping Use   Vaping Use: Never used  Substance Use Topics   Alcohol use: Yes    Alcohol/week: 1.0 standard drink of alcohol    Types: 1 Glasses of wine per week   Drug use: No   Family History  Problem Relation Age of Onset   Hypertension Father    Diabetes Father    Stroke Father    Migraines Father    Barrett's esophagus Father    Anemia Father        iron def   Heart disease Father        pacemaker, a fib, CHF   Bipolar disorder Father    Dementia Mother    Allergic rhinitis Mother    Diabetes Sister    Hyperlipidemia Sister    Deep vein thrombosis Sister    Anxiety disorder Daughter    Bipolar disorder Daughter    ADD / ADHD Daughter    Neuropathy Daughter    Syncope episode Daughter    Diabetes Paternal Grandfather    COPD Paternal Grandmother    Breast cancer Paternal Grandmother    Angioedema Neg Hx    Asthma Neg Hx    Atopy Neg Hx     Eczema Neg Hx    Immunodeficiency Neg Hx    Urticaria Neg Hx    Allergies  Allergen Reactions   Codeine Itching   Hydrocodone-Acetaminophen Itching   Other    Povidone-Iodine Rash        Propoxyphene N-Acetaminophen Itching        Current Outpatient Medications on File Prior to Visit  Medication Sig Dispense Refill   albuterol (VENTOLIN HFA) 108 (90 Base) MCG/ACT inhaler INHALE 2 PUFFS INTO THE LUNGS EVERY 4 HOURS AS NEEDED FOR WHEEZE 8.5 each 0   aspirin EC 81 MG tablet Take 81 mg by mouth daily.     atorvastatin (LIPITOR) 80 MG tablet TAKE 1 TABLET (80 MG TOTAL) BY MOUTH DAILY. SCHEDULE OFFICE VISIT FOR FUTURE REFILLS SECOND ATTEMPT 60 tablet 1   CALCIUM PO Take by mouth.     cholecalciferol (VITAMIN D3) 25 MCG (1000 UNIT) tablet Take 1,000 Units by mouth daily.     diphenhydrAMINE (BENADRYL) 25 MG tablet Take 75 mg by mouth at bedtime as needed for sleep.     esomeprazole (NEXIUM) 40 MG capsule TAKE 1 CAPSULE BY MOUTH EVERY DAY IN THE MORNING 90 capsule 0   fluticasone (FLONASE) 50 MCG/ACT nasal spray Place 1 spray into both nostrils daily  for 14 days. 16 g 0   meclizine (ANTIVERT) 25 MG tablet Take 1 tablet (25 mg total) by mouth 3 (three) times daily as needed for dizziness or nausea. 30 tablet 0   metoprolol tartrate (LOPRESSOR) 25 MG tablet TAKE 1 TABLET BY MOUTH TWICE A DAY 180 tablet 3   montelukast (SINGULAIR) 10 MG tablet TAKE 1 TABLET BY MOUTH EVERYDAY AT BEDTIME 90 tablet 0   naproxen sodium (ALEVE) 220 MG tablet Take 440 mg by mouth daily as needed (pain).     NEOMYCIN-POLYMYXIN-HYDROCORTISONE (CORTISPORIN) 1 % SOLN OTIC solution Apply 1-2 drops to toe BID after soaking 10 mL 1   triamcinolone ointment (KENALOG) 0.5 % Apply 1 application topically 2 (two) times daily. Use sparingly as needed/ directed. 30 g 0   vitamin C (ASCORBIC ACID) 500 MG tablet Take 500 mg by mouth daily.     vitamin E (VITAMIN E) 400 UNIT capsule Take 2 capsules (800 Units total) by mouth daily.  60 capsule 5   zinc gluconate 50 MG tablet Take 50 mg by mouth daily.     [DISCONTINUED] norethindrone-ethinyl estradiol-iron (MICROGESTIN FE1.5/30) 1.5-30 MG-MCG tablet Take 1 tablet by mouth daily.       No current facility-administered medications on file prior to visit.     Review of Systems  Constitutional:  Positive for fatigue. Negative for activity change, appetite change, fever and unexpected weight change.  HENT:  Negative for congestion, ear pain, rhinorrhea, sinus pressure and sore throat.   Eyes:  Negative for pain, redness and visual disturbance.  Respiratory:  Negative for cough, shortness of breath and wheezing.   Cardiovascular:  Negative for chest pain and palpitations.  Gastrointestinal:  Negative for abdominal pain, blood in stool, constipation and diarrhea.  Endocrine: Negative for polydipsia and polyuria.  Genitourinary:  Negative for dysuria, frequency and urgency.  Musculoskeletal:  Negative for arthralgias, back pain and myalgias.  Skin:  Negative for pallor and rash.  Allergic/Immunologic: Negative for environmental allergies.  Neurological:  Negative for dizziness, syncope and headaches.  Hematological:  Negative for adenopathy. Does not bruise/bleed easily.  Psychiatric/Behavioral:  Negative for decreased concentration and dysphoric mood. The patient is not nervous/anxious.        Caregiving stress       Objective:   Physical Exam Constitutional:      General: She is not in acute distress.    Appearance: Normal appearance. She is well-developed. She is obese. She is not ill-appearing or diaphoretic.  HENT:     Head: Normocephalic and atraumatic.     Right Ear: Tympanic membrane, ear canal and external ear normal.     Left Ear: Tympanic membrane, ear canal and external ear normal.     Nose: Nose normal. No congestion.     Mouth/Throat:     Mouth: Mucous membranes are moist.     Pharynx: Oropharynx is clear. No posterior oropharyngeal erythema.  Eyes:      General: No scleral icterus.    Extraocular Movements: Extraocular movements intact.     Conjunctiva/sclera: Conjunctivae normal.     Pupils: Pupils are equal, round, and reactive to light.  Neck:     Thyroid: No thyromegaly.     Vascular: No carotid bruit or JVD.  Cardiovascular:     Rate and Rhythm: Normal rate and regular rhythm.     Pulses: Normal pulses.     Heart sounds: Normal heart sounds.     No gallop.  Pulmonary:     Effort:  Pulmonary effort is normal. No respiratory distress.     Breath sounds: Normal breath sounds. No wheezing.     Comments: Good air exch Chest:     Chest wall: No tenderness.  Abdominal:     General: Bowel sounds are normal. There is no distension or abdominal bruit.     Palpations: Abdomen is soft. There is no mass.     Tenderness: There is no abdominal tenderness.     Hernia: No hernia is present.  Genitourinary:    Comments: Breast exam: No mass, nodules, thickening, tenderness, bulging, retraction, inflamation, nipple discharge or skin changes noted.  No axillary or clavicular LA.     Musculoskeletal:        General: No tenderness. Normal range of motion.     Cervical back: Normal range of motion and neck supple. No rigidity. No muscular tenderness.     Right lower leg: No edema.     Left lower leg: No edema.     Comments: No kyphosis   Lymphadenopathy:     Cervical: No cervical adenopathy.  Skin:    General: Skin is warm and dry.     Coloration: Skin is not pale.     Findings: No erythema or rash.     Comments: Solar lentigines diffusely   Neurological:     Mental Status: She is alert. Mental status is at baseline.     Cranial Nerves: No cranial nerve deficit.     Motor: No abnormal muscle tone.     Coordination: Coordination normal.     Gait: Gait normal.     Deep Tendon Reflexes: Reflexes are normal and symmetric. Reflexes normal.  Psychiatric:        Mood and Affect: Mood normal.        Cognition and Memory: Cognition and  memory normal.     Comments: Mood is good today   Candidly discusses symptoms and stressors             Assessment & Plan:   Problem List Items Addressed This Visit       Digestive   GERD    Unable to wean ppi so far Taking vit B12         Other   Anxiety and depression    Under psychiatric care for this with bipolar      Bipolar 1 disorder, mixed, moderate ([place])    Under new psychiatrist and replaced other meds with lithium This is helping more  Getting labs at her pych office as well  Commended  Reviewed stressors/ coping techniques/symptoms/ support sources/ tx options and side effects in detail today       Colon cancer screening    Colonoscopy due Ref made to [facility], Dr Hilarie Fredrickson       Relevant Orders   Ambulatory referral to Gastroenterology   Current use of proton pump inhibitor    Low B12 in past Now high level with 1000 mcg daily  Will cut to 500       Elevated TSH    Lab Results  Component Value Date   TSH 6.89 (H) 01/27/2022  Taking lithium FT4 still in nl range  Will continue to watch      Encounter for screening mammogram for breast cancer    Mammogram ordered Pt will call to schedule this      Relevant Orders   MM 3D SCREEN BREAST BILATERAL   Mixed hyperlipidemia    Disc goals for lipids and reasons  to control them Rev last labs with pt Rev low sat fat diet in detail Taking atorvastatin 80 mg and tolerating well       Prediabetes    Lab Results  Component Value Date   HGBA1C 6.5 01/27/2022  this is up  Less time for self care  disc imp of low glycemic diet and wt loss to prevent DM2  F/u in 3 mo  Goal to get this down out of dm range      Routine general medical examination at a health care facility - Primary    Reviewed health habits including diet and exercise and skin cancer prevention Reviewed appropriate screening tests for age  Also reviewed health mt list, fam hx and immunization status , as well as social and  family history   See HPI Labs reviewed  Plans to get shingrix vaccine if covered  Colonoscopy ref done  Mammogram ordered/pt to schedule Pap utd  Mood is improving       Vitamin B12 deficiency    Lab Results  Component Value Date   VITAMINB12 >1500 (H) 01/27/2022  Will cut dose to 500 mcg daily  Balanced diet

## 2022-01-31 NOTE — Assessment & Plan Note (Signed)
Lab Results  Component Value Date   VITAMINB12 >1500 (H) 01/27/2022   Will cut dose to 500 mcg daily  Balanced diet

## 2022-01-31 NOTE — Patient Instructions (Addendum)
If you are interested in the shingles vaccine series (Shingrix), call your insurance or pharmacy to check on coverage and location it must be given.  If affordable - you can schedule it here or at your pharmacy depending on coverage    Call and schedule your colonsocopy with [facility]  [phone]  Call and schedule your mammogram at the breast center   For cholesterol Avoid red meat/ fried foods/ egg yolks/ fatty breakfast meats/ butter, cheese and high fat dairy/ and shellfish   For blood sugar Try to get most of your carbohydrates from produce (with the exception of white potatoes)  Eat less bread/pasta/rice/snack foods/cereals/sweets and other items from the middle of the grocery store (processed carbs)  Go back down to 500 mcg of vitamin B12 daily   I want to check on the blood sugar again in 3 months  Please follow up in 3 months with labs prior

## 2022-01-31 NOTE — Assessment & Plan Note (Signed)
Low B12 in past Now high level with 1000 mcg daily  Will cut to 500

## 2022-01-31 NOTE — Assessment & Plan Note (Addendum)
Lab Results  Component Value Date   HGBA1C 6.5 01/27/2022  this is up  Less time for self care  disc imp of low glycemic diet and wt loss to prevent DM2  F/u in 3 mo  Goal to get this down out of dm range

## 2022-01-31 NOTE — Assessment & Plan Note (Signed)
Colonoscopy due Ref made to [facility], Dr Hilarie Fredrickson

## 2022-01-31 NOTE — Assessment & Plan Note (Signed)
Mammogram ordered Pt will call to schedule this

## 2022-01-31 NOTE — Assessment & Plan Note (Signed)
Reviewed health habits including diet and exercise and skin cancer prevention Reviewed appropriate screening tests for age  Also reviewed health mt list, fam hx and immunization status , as well as social and family history   See HPI Labs reviewed  Plans to get shingrix vaccine if covered  Colonoscopy ref done  Mammogram ordered/pt to schedule Pap utd  Mood is improving

## 2022-03-13 ENCOUNTER — Ambulatory Visit (INDEPENDENT_AMBULATORY_CARE_PROVIDER_SITE_OTHER): Payer: BC Managed Care – PPO | Admitting: Internal Medicine

## 2022-03-13 ENCOUNTER — Encounter: Payer: Self-pay | Admitting: Internal Medicine

## 2022-03-13 DIAGNOSIS — T2200XA Burn of unspecified degree of shoulder and upper limb, except wrist and hand, unspecified site, initial encounter: Secondary | ICD-10-CM | POA: Insufficient documentation

## 2022-03-13 DIAGNOSIS — L237 Allergic contact dermatitis due to plants, except food: Secondary | ICD-10-CM

## 2022-03-13 DIAGNOSIS — X19XXXA Contact with other heat and hot substances, initial encounter: Secondary | ICD-10-CM

## 2022-03-13 MED ORDER — BETAMETHASONE DIPROPIONATE AUG 0.05 % EX OINT: TOPICAL_OINTMENT | Freq: Two times a day (BID) | CUTANEOUS | 0 refills | Status: DC
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
## 2022-03-13 NOTE — Assessment & Plan Note (Signed)
It has red eschar and seems intact No signs of infection Can try triamcinolone just for itching---but reassured it seems to be healing okay

## 2022-03-13 NOTE — Assessment & Plan Note (Addendum)
Just starting with some inflamed papules (her usual) Will Rx diprolene cream--TAC didn't work in the past

## 2022-03-14 ENCOUNTER — Ambulatory Visit: Payer: BC Managed Care – PPO

## 2022-03-22 ENCOUNTER — Other Ambulatory Visit: Payer: Self-pay | Admitting: Family Medicine

## 2022-03-28 ENCOUNTER — Other Ambulatory Visit: Payer: Self-pay | Admitting: Family Medicine

## 2022-03-28 DIAGNOSIS — J4520 Mild intermittent asthma, uncomplicated: Secondary | ICD-10-CM

## 2022-04-11 ENCOUNTER — Ambulatory Visit
Admission: RE | Admit: 2022-04-11 | Discharge: 2022-04-11 | Disposition: A | Discharge status: Home / Self Care | Payer: BC Managed Care – PPO | Source: Ambulatory Visit | Attending: Family Medicine | Admitting: Family Medicine

## 2022-04-11 DIAGNOSIS — Z1231 Encounter for screening mammogram for malignant neoplasm of breast: Secondary | ICD-10-CM

## 2022-04-18 ENCOUNTER — Other Ambulatory Visit: Payer: Self-pay | Admitting: Internal Medicine

## 2022-05-04 ENCOUNTER — Ambulatory Visit: Payer: BC Managed Care – PPO | Admitting: Family Medicine

## 2022-05-04 ENCOUNTER — Encounter: Payer: Self-pay | Admitting: Family Medicine

## 2022-05-04 VITALS — BP 116/70 | HR 62 | Temp 97.2°F | Ht 61.0 in | Wt 205.0 lb

## 2022-05-04 DIAGNOSIS — J4520 Mild intermittent asthma, uncomplicated: Secondary | ICD-10-CM

## 2022-05-04 DIAGNOSIS — R051 Acute cough: Secondary | ICD-10-CM | POA: Diagnosis not present

## 2022-05-04 DIAGNOSIS — J208 Acute bronchitis due to other specified organisms: Secondary | ICD-10-CM | POA: Diagnosis not present

## 2022-05-04 LAB — POC COVID19 BINAXNOW: SARS Coronavirus 2 Ag: NEGATIVE

## 2022-05-04 MED ORDER — DOXYCYCLINE HYCLATE 100 MG PO TABS: 100.0000 mg | ORAL_TABLET | Freq: Two times a day (BID) | ORAL | 0 refills | Status: DC

## 2022-05-04 MED ORDER — ALBUTEROL SULFATE HFA 108 (90 BASE) MCG/ACT IN AERS: INHALATION_SPRAY | RESPIRATORY_TRACT | 2 refills | Status: DC

## 2022-05-04 NOTE — Progress Notes (Signed)
Michele Hupfer T. [name], MD, [place] at [facility] [place], [address]  Phone: [phone]  FAX: [phone]  Michele [name] - 56 y.o. female  MRN [id number]  Date of Birth: May 19, 1966  Date: 05/04/2022  PCP: Abner Greenspan, MD  Referral: Abner Greenspan, MD  Chief Complaint  Patient presents with   Cough    C/o cold/burning sensation in mid chest when taking deep breath and cough. Sxs started 05/03/22.  Concerned due to h/o asthma and bronchitis.    Subjective:   Michele [name] is a 56 y.o. very pleasant female patient with Body mass index is 38.73 kg/m. who presents with the following:  Does have a history of asthma.    Has not had to use inhaler.   She presents with a 2-day history of nasal congestion, coughing, sore throat, stuffiness.  She does have a history of asthma, and she is not really having much significant shortness of breath or wheezing right now.  She denies any nausea, vomiting, diarrhea.  No changes in taste or smell.  No other neurological complaints, dysuria, urgency.  Review of Systems is noted in the HPI, as appropriate  Objective:   BP 116/70   Pulse 62   Temp (!) 97.2 F (36.2 C) (Temporal)   Ht '5\' 1"'$  (1.549 m)   Wt 205 lb (93 kg)   LMP 03/06/2014   SpO2 97%   BMI 38.73 kg/m    Gen: WDWN, NAD. Globally Non-toxic HEENT: Throat clear, w/o exudate, R TM clear, L TM - good landmarks, No fluid present. rhinnorhea.  MMM Frontal sinuses: NT Max sinuses: NT NECK: Anterior cervical  LAD is absent CV: RRR, No M/G/R, cap refill <2 sec PULM: Breathing comfortably in no respiratory distress. no wheezing, crackles, rhonchi   Laboratory and Imaging Data: Results for orders placed or performed in visit on 05/04/22  POC COVID-19  Result Value Ref Range   SARS Coronavirus 2 Ag Negative Negative     Assessment and Plan:     ICD-10-CM   1. Acute bronchitis due to other specified organisms   J20.8     2. Acute cough  R05.1 POC COVID-19    3. Mild intermittent asthma without complication  D62.22      Ongoing respiratory illness.  I think she can continue supportive care.  Ventolin as needed.  If her symptoms persist in 3 to 4 days and she is not improving then I think it be reasonable for her to pick up her doxycycline  Medication Management during today's office visit: Meds ordered this encounter  Medications   albuterol (VENTOLIN HFA) 108 (90 Base) MCG/ACT inhaler    Sig: INHALE 2 PUFFS INTO THE LUNGS EVERY 4 HOURS AS NEEDED FOR WHEEZE    Dispense:  8 g    Refill:  2   doxycycline (VIBRA-TABS) 100 MG tablet    Sig: Take 1 tablet (100 mg total) by mouth 2 (two) times daily.    Dispense:  20 tablet    Refill:  0   Medications Discontinued During This Encounter  Medication Reason   augmented betamethasone dipropionate (DIPROLENE) 0.05 % ointment Completed Course   albuterol (VENTOLIN HFA) 108 (90 Base) MCG/ACT inhaler Reorder    Orders placed today for conditions managed today: Orders Placed This Encounter  Procedures   POC COVID-19    Disposition: No follow-ups on file.  [employer] One speech-to-text software was used for transcription in  this dictation.  Possible transcriptional errors can occur using Editor, commissioning.   Signed,  Maud Deed. Eugune Sine, MD   Outpatient Encounter Medications as of 05/04/2022  Medication Sig   albuterol (VENTOLIN HFA) 108 (90 Base) MCG/ACT inhaler INHALE 2 PUFFS INTO THE LUNGS EVERY 4 HOURS AS NEEDED FOR WHEEZE   ARIPiprazole (ABILIFY) 5 MG tablet Take 5 mg by mouth every morning.   aspirin EC 81 MG tablet Take 81 mg by mouth daily.   atorvastatin (LIPITOR) 80 MG tablet Take 1 tablet (80 mg total) by mouth daily. SCHEDULE OFFICE VISIT FOR FUTURE REFILLS THIRD ATTEMPT   CALCIUM PO Take by mouth.   cholecalciferol (VITAMIN D3) 25 MCG (1000 UNIT) tablet Take 1,000 Units by mouth daily.   diphenhydrAMINE (BENADRYL) 25 MG  tablet Take 75 mg by mouth at bedtime as needed for sleep.   doxycycline (VIBRA-TABS) 100 MG tablet Take 1 tablet (100 mg total) by mouth 2 (two) times daily.   esomeprazole (NEXIUM) 40 MG capsule TAKE 1 CAPSULE BY MOUTH EVERY DAY IN THE MORNING   lamoTRIgine (LAMICTAL) 25 MG tablet Take 25 mg by mouth daily.   meclizine (ANTIVERT) 25 MG tablet Take 1 tablet (25 mg total) by mouth 3 (three) times daily as needed for dizziness or nausea.   metoprolol tartrate (LOPRESSOR) 25 MG tablet TAKE 1 TABLET BY MOUTH TWICE A DAY   montelukast (SINGULAIR) 10 MG tablet TAKE 1 TABLET BY MOUTH EVERYDAY AT BEDTIME   naproxen sodium (ALEVE) 220 MG tablet Take 440 mg by mouth daily as needed (pain).   Omega-3 Fatty Acids (FISH OIL OMEGA-3 PO) Take by mouth.   traZODone (DESYREL) 50 MG tablet Take 50 mg by mouth at bedtime.   vitamin B-12 (CYANOCOBALAMIN) 500 MCG tablet Take 500 mcg by mouth daily.   vitamin C (ASCORBIC ACID) 500 MG tablet Take 500 mg by mouth daily.   vitamin E (VITAMIN E) 400 UNIT capsule Take 2 capsules (800 Units total) by mouth daily.   zinc gluconate 50 MG tablet Take 50 mg by mouth daily.   [DISCONTINUED] albuterol (VENTOLIN HFA) 108 (90 Base) MCG/ACT inhaler INHALE 2 PUFFS INTO THE LUNGS EVERY 4 HOURS AS NEEDED FOR WHEEZE   fluticasone (FLONASE) 50 MCG/ACT nasal spray Place 1 spray into both nostrils daily for 14 days.   [DISCONTINUED] augmented betamethasone dipropionate (DIPROLENE) 0.05 % ointment Apply topically 2 (two) times daily.   [DISCONTINUED] norethindrone-ethinyl estradiol-iron (MICROGESTIN FE1.5/30) 1.5-30 MG-MCG tablet Take 1 tablet by mouth daily.     No facility-administered encounter medications on file as of 05/04/2022.

## 2022-05-11 ENCOUNTER — Other Ambulatory Visit: Payer: Self-pay | Admitting: Internal Medicine

## 2022-05-18 ENCOUNTER — Encounter: Payer: Self-pay | Admitting: Internal Medicine

## 2022-05-18 ENCOUNTER — Ambulatory Visit: Payer: BC Managed Care – PPO | Attending: Internal Medicine | Admitting: Internal Medicine

## 2022-05-18 VITALS — BP 109/68 | HR 52 | Ht 68.0 in | Wt 203.0 lb

## 2022-05-18 DIAGNOSIS — E782 Mixed hyperlipidemia: Secondary | ICD-10-CM

## 2022-05-18 DIAGNOSIS — R0600 Dyspnea, unspecified: Secondary | ICD-10-CM | POA: Diagnosis not present

## 2022-05-18 DIAGNOSIS — I2510 Atherosclerotic heart disease of native coronary artery without angina pectoris: Secondary | ICD-10-CM | POA: Diagnosis not present

## 2022-05-18 DIAGNOSIS — R5383 Other fatigue: Secondary | ICD-10-CM

## 2022-05-18 NOTE — Progress Notes (Signed)
OFFICE CONSULT NOTE  Chief Complaint:  Follow-up  Primary Care Physician: Tower, Wynelle Fanny, MD  HPI:  Michele [name] is a 56 y.o. female who is being seen today for the evaluation of chest pain at the request of Tower, Wynelle Fanny, MD.  This is a pleasant 56 year old female kindly referred for evaluation of chest pain.  Past medical history significant for anxiety, asthma, depression and recent diagnosis of bipolar disorder, hyperlipidemia, GERD, IBS and other medical problems.  Over the past several months she has had some progressive chest pressure as well as pain in her left neck rating down into the left arm.  This comes on a little more with exertion and is relieved at rest.  She and her husband have been doing more walking and on a concentrated program to try to lose weight.  She notes it is particularly difficult when she walks up an incline or stairs.  Family history significant for heart disease in her father who had A. fib heart failure and a pacemaker and early coronary disease in his 3s or 58s.  Her mom also has hyperlipidemia and she has a sister who has type 2 diabetes.  She has been on atorvastatin for some time 20 mg.  Interestingly a recent lipid profile in October 2019 showed total cholesterol 283, HDL 47, LDL 208 and triglycerides 139.  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  We did discuss diet at some length today.  Although she does eat some atherogenic foods, and general diet recently has been lower in cholesterol and leaner meats.  She says she eats a lot of venison as well as vegetables and chicken.  With regards to her chest pain.  She describes it as a pressure or squeezing sensation, associated with some shortness of breath.  In addition she has been having palpitations.  This is awakening her at night and use occurs around 3 to 3:30 AM.  This is about an hour  before her husband wakes up.  She denies any intrusive dreams.  Her husband notes that she does not snore heavily, gas or breath or have any other symptoms concerning for apnea.  05/18/2022  Laskin returns today for follow-up with her husband.  She has recently been having some sharp central to left-sided chest pains.  Symptoms are sporadic and not necessarily worse with exertion or relieved by rest.  She has had some interval weight loss about 8 to 10 pounds.  She has felt worsening fatigue and exercise intolerance.  Heart rate is in the low 50s today with a low normal blood pressure 109/68.  She is on metoprolol.  She had a coronary CT angiogram back in June 2020.  This demonstrated a calcium score of 129, 97th percentile for age and matched controls, however mild nonobstructive coronary disease was noted.  She has had excellent blood pressure control.  Her cholesterol also is reasonably well treated with total 148, HDL 47, triglycerides 115 and LDL 78.  Her target LDL is less than 70.  PMHx:  Past Medical History:  Diagnosis Date   Abnormal Pap smear of cervix    cryotx   Anxiety    Arthritis    Asthma    Depression    Endometriosis    Fatty liver    Gastric polyp    GERD (gastroesophageal reflux disease)    Hemorrhoids    Hiatal hernia  HLD (hyperlipidemia)    Hyperplastic colon polyp    IBS (irritable bowel syndrome)    Iron deficiency anemia 1/12   Pneumonia    Rectocele    Rotator cuff tear, left 2009   Syncope    Tachycardia    s/p negative cardiac work up with echo   Uterine fibroid [id number]    Past Surgical History:  Procedure Laterality Date   CARPAL TUNNEL RELEASE Bilateral    COLONOSCOPY  1999   small polyps, hemms   ESOPHAGOGASTRODUODENOSCOPY  2/12   gastric polyp and HH   HYSTEROSCOPY     LAPAROSCOPY  2006   endometriosis   ROTATOR CUFF REPAIR Left    TRIGGER FINGER RELEASE      FAMHx:  Family History  Problem Relation Age of Onset   Hypertension  Father    Diabetes Father    Stroke Father    Migraines Father    Barrett's esophagus Father    Anemia Father        iron def   Heart disease Father        pacemaker, a fib, CHF   Bipolar disorder Father    Dementia Mother    Allergic rhinitis Mother    Diabetes Sister    Hyperlipidemia Sister    Deep vein thrombosis Sister    Anxiety disorder Daughter    Bipolar disorder Daughter    ADD / ADHD Daughter    Neuropathy Daughter    Syncope episode Daughter    Diabetes Paternal Grandfather    COPD Paternal Grandmother    Breast cancer Paternal Grandmother    Angioedema Neg Hx    Asthma Neg Hx    Atopy Neg Hx    Eczema Neg Hx    Immunodeficiency Neg Hx    Urticaria Neg Hx     SOCHx:   reports that she quit smoking about 29 years ago. Her smoking use included cigarettes. She has a 1.25 pack-year smoking history. She has been exposed to tobacco smoke. She has never used smokeless tobacco. She reports current alcohol use of about 1.0 standard drink of alcohol per week. She reports that she does not use drugs.  ALLERGIES:  Allergies  Allergen Reactions   Codeine Itching   Hydrocodone-Acetaminophen Itching   Lactose Intolerance (Gi)    Other    Povidone-Iodine Rash        Propoxyphene N-Acetaminophen Itching         ROS: Pertinent items noted in HPI and remainder of comprehensive ROS otherwise negative.  HOME MEDS: Current Outpatient Medications on File Prior to Visit  Medication Sig Dispense Refill   albuterol (VENTOLIN HFA) 108 (90 Base) MCG/ACT inhaler INHALE 2 PUFFS INTO THE LUNGS EVERY 4 HOURS AS NEEDED FOR WHEEZE 8 g 2   ARIPiprazole (ABILIFY) 5 MG tablet Take 5 mg by mouth every morning.     aspirin EC 81 MG tablet Take 81 mg by mouth daily.     atorvastatin (LIPITOR) 80 MG tablet TAKE 1 TABLET (80 MG TOTAL) BY MOUTH DAILY. SCHEDULE OFFICE VISIT FOR FUTURE REFILLS THIRD ATTEMPT 90 tablet 1   CALCIUM PO Take by mouth.     cholecalciferol (VITAMIN D3) 25 MCG  (1000 UNIT) tablet Take 1,000 Units by mouth daily.     diphenhydrAMINE (BENADRYL) 25 MG tablet Take 75 mg by mouth at bedtime as needed for sleep.     doxycycline (VIBRA-TABS) 100 MG tablet Take 1 tablet (100 mg total) by mouth 2 (two) times  daily. 20 tablet 0   esomeprazole (NEXIUM) 40 MG capsule TAKE 1 CAPSULE BY MOUTH EVERY DAY IN THE MORNING 90 capsule 2   FLUoxetine (PROZAC) 10 MG capsule Take 10 mg by mouth daily.     FLUoxetine (PROZAC) 20 MG capsule Take 20 mg by mouth daily.     lamoTRIgine (LAMICTAL) 25 MG tablet Take 25 mg by mouth daily.     meclizine (ANTIVERT) 25 MG tablet Take 1 tablet (25 mg total) by mouth 3 (three) times daily as needed for dizziness or nausea. 30 tablet 0   metoprolol tartrate (LOPRESSOR) 25 MG tablet TAKE 1 TABLET BY MOUTH TWICE A DAY 180 tablet 3   montelukast (SINGULAIR) 10 MG tablet TAKE 1 TABLET BY MOUTH EVERYDAY AT BEDTIME 90 tablet 1   naproxen sodium (ALEVE) 220 MG tablet Take 440 mg by mouth daily as needed (pain).     Omega-3 Fatty Acids (FISH OIL OMEGA-3 PO) Take by mouth.     traZODone (DESYREL) 50 MG tablet Take 50 mg by mouth at bedtime.     vitamin B-12 (CYANOCOBALAMIN) 500 MCG tablet Take 500 mcg by mouth daily.     vitamin C (ASCORBIC ACID) 500 MG tablet Take 500 mg by mouth daily.     vitamin E (VITAMIN E) 400 UNIT capsule Take 2 capsules (800 Units total) by mouth daily. 60 capsule 5   zinc gluconate 50 MG tablet Take 50 mg by mouth daily.     fluticasone (FLONASE) 50 MCG/ACT nasal spray Place 1 spray into both nostrils daily for 14 days. 16 g 0   [DISCONTINUED] norethindrone-ethinyl estradiol-iron (MICROGESTIN FE1.5/30) 1.5-30 MG-MCG tablet Take 1 tablet by mouth daily.       No current facility-administered medications on file prior to visit.    LABS/IMAGING: No results found for this or any previous visit (from the past 48 hour(s)). No results found.  LIPID PANEL:    Component Value Date/Time   CHOL 148 01/27/2022 0800   CHOL  149 11/11/2018 1004   TRIG 115.0 01/27/2022 0800   HDL 47.40 01/27/2022 0800   HDL 55 11/11/2018 1004   CHOLHDL 3 01/27/2022 0800   VLDL 23.0 01/27/2022 0800   LDLCALC 78 01/27/2022 0800   LDLCALC 76 11/11/2018 1004   LDLDIRECT 202.8 03/11/2012 1353    WEIGHTS: Wt Readings from Last 3 Encounters:  05/18/22 203 lb (92.1 kg)  05/04/22 205 lb (93 kg)  03/13/22 207 lb (93.9 kg)    VITALS: BP 109/68 (BP Location: Left Arm, Patient Position: Sitting)   Pulse (!) 52   Ht '5\' 8"'$  (1.727 m)   Wt 203 lb (92.1 kg)   LMP 03/06/2014   SpO2 95%   BMI 30.87 kg/m   EXAM: General appearance: alert and no distress Neck: no carotid bruit, no JVD and thyroid not enlarged, symmetric, no tenderness/mass/nodules Lungs: clear to auscultation bilaterally Heart: regular rate and rhythm, S1, S2 normal, no murmur, click, rub or gallop Abdomen: soft, non-tender; bowel sounds normal; no masses,  no organomegaly Extremities: extremities normal, atraumatic, no cyanosis or edema Pulses: 2+ and symmetric Skin: Skin color, texture, turgor normal. No rashes or lesions Neurologic: Grossly normal Psych: Pleasant  EKG: Sinus bradycardia 52-personally reviewed  ASSESSMENT: Chest pain, possibly concerning for unstable angina Abnormal coronary CTA with CAC score 129, 97th percentile, minimal nonobstructive coronary disease (12/2018) Possible familial hyperlipidemia-Dutch score 6 Family history of premature coronary disease  PLAN: 1.   Mrs. Cayton has a strong family history of  heart disease and a mildly abnormal coronary CT with age advanced coronary calcification.  She is now presenting with more chest pain and shortness of breath with exertion.  She is also had fatigue.  Her symptoms could be concerning for unstable angina.  I would like to go ahead and repeat her coronary CT as its been 3 years since her last study.  Cholesterol seems to be well controlled.  I would recommend an LP(a).  Plan follow-up after  study.  As she is noted to be bradycardic and hypotensive today.  This might be contributing to her fatigue.  She has had close to 10 pound recent weight loss which might make the medication a little too strong for her.  After her CT, I would advise decreasing the dose to 12.5 mg twice daily.  Plan follow-up with me in about 3 months.  Pixie Casino, MD, [facility], [place] Director of the [facility]  [facility] Diplomate of the American Board of Clinical Lipidology Attending Cardiologist  Direct Dial: [phone]  Fax: [phone]  Website:  [email or web address] 05/18/2022, 9:28 PM

## 2022-05-18 NOTE — Patient Instructions (Addendum)
Medication Instructions:  NO CHANGES  After CT test, decrease metoprolol tartrate to 12.'5mg'$  twice daily   *If you need a refill on your cardiac medications before your next appointment, please call your pharmacy*   Lab Work: Non-Fasting BMET prior to CT test   If you have labs (blood work) drawn today and your tests are completely normal, you will receive your results only by: [place] (if you have MyChart) OR A paper copy in the mail If you have any lab test that is abnormal or we need to change your treatment, we will call you to review the results.   Testing/Procedures: Coronary CT at [place], you and your health needs are our priority.  As part of our continuing mission to provide you with exceptional heart care, we have created designated Provider Care Teams.  These Care Teams include your primary Cardiologist (physician) and Advanced Practice Providers (APPs -  Physician Assistants and Nurse Practitioners) who all work together to provide you with the care you need, when you need it.  We recommend signing up for the patient portal called "MyChart".  Sign up information is provided on this After Visit Summary.  MyChart is used to connect with patients for Virtual Visits (Telemedicine).  Patients are able to view lab/test results, encounter notes, upcoming appointments, etc.  Non-urgent messages can be sent to your provider as well.   To learn more about what you can do with MyChart, go to [email or web address].    Your next appointment:    3 months with Dr. Debara Pickett    Your cardiac CT will be scheduled at one of the below locations:   [facility] [address] [place], [place] [address] [phone]  [place] [address] [place], [place] [address] [phone]  [facility] [place], [place]  [address] [phone]  If scheduled at [facility], please arrive at the [facility] and Children's Entrance (Entrance C2) of [facility] 30 minutes prior to test start time. You can use the FREE valet parking offered at entrance C (encouraged to control the heart rate for the test)  Proceed to the [facility] Radiology Department (first floor) to check-in and test prep.  All radiology patients and guests should use entrance C2 at [facility], accessed from [facility], even though the hospital's physical address listed is [address].    If scheduled at [facility] or [facility], please arrive 15 mins early for check-in and test prep.   Please follow these instructions carefully (unless otherwise directed):  Hold all erectile dysfunction medications at least 3 days (72 hrs) prior to test. (Ie viagra, cialis, sildenafil, tadalafil, etc) We will administer nitroglycerin during this exam.   On the Night Before the Test: Be sure to Drink plenty of water. Do not consume any caffeinated/decaffeinated beverages or chocolate 12 hours prior to your test. Do not take any antihistamines 12 hours prior to your test.  On the Day of the Test: Drink plenty of water until 1 hour prior to the test. Do not eat any food 1 hour prior to test. You may take your regular medications prior to the test.  FEMALES- please wear underwire-free bra if available, avoid dresses & tight clothing  After the Test: Drink plenty of water. After receiving IV contrast, you may  experience a mild flushed feeling. This is normal. On occasion, you may experience a mild rash up to 24 hours after the test. This is not dangerous. If this occurs, you can take Benadryl 25 mg and increase your fluid intake. If you experience trouble breathing, this can be serious. If it is severe call 911 IMMEDIATELY. If it is mild, please call our office. If  you take any of these medications: Glipizide/Metformin, Avandament, Glucavance, please do not take 48 hours after completing test unless otherwise instructed.  We will call to schedule your test 2-4 weeks out understanding that some insurance companies will need an authorization prior to the service being performed.   For non-scheduling related questions, please contact the cardiac imaging nurse navigator should you have any questions/concerns: Marchia Bond, Cardiac Imaging Nurse Navigator Gordy Clement, Cardiac Imaging Nurse Navigator [facility] Direct Office Dial: [phone]   For scheduling needs, including cancellations and rescheduling, please call [place], [phone].

## 2022-06-01 ENCOUNTER — Telehealth (HOSPITAL_COMMUNITY): Payer: Self-pay | Admitting: Emergency Medicine

## 2022-06-01 NOTE — Telephone Encounter (Signed)
Reaching out to patient to offer assistance regarding upcoming cardiac imaging study; pt verbalizes understanding of appt date/time, parking situation and where to check in, pre-test NPO status and medications ordered, and verified current allergies; name and call back number provided for further questions should they arise Michele Bond RN Navigator Cardiac Imaging [facility] [phone] office [phone] cell   Arrival 100p Daily meds per usual

## 2022-06-02 ENCOUNTER — Ambulatory Visit (HOSPITAL_COMMUNITY)
Admission: RE | Admit: 2022-06-02 | Discharge: 2022-06-02 | Disposition: A | Discharge status: Home / Self Care | Payer: BC Managed Care – PPO | Source: Ambulatory Visit | Attending: Internal Medicine | Admitting: Internal Medicine

## 2022-06-02 DIAGNOSIS — R0600 Dyspnea, unspecified: Secondary | ICD-10-CM | POA: Diagnosis not present

## 2022-06-02 DIAGNOSIS — I2510 Atherosclerotic heart disease of native coronary artery without angina pectoris: Secondary | ICD-10-CM | POA: Diagnosis present

## 2022-06-02 DIAGNOSIS — R5383 Other fatigue: Secondary | ICD-10-CM | POA: Diagnosis not present

## 2022-06-02 MED ORDER — IOHEXOL 350 MG/ML SOLN
95.0000 mL | Freq: Once | INTRAVENOUS | Status: AC | PRN
  Administered 2022-06-02: 95 mL via INTRAVENOUS

## 2022-06-02 MED ORDER — NITROGLYCERIN 0.4 MG SL SUBL
SUBLINGUAL_TABLET | SUBLINGUAL | Status: AC
  Filled 2022-06-02: qty 2

## 2022-06-02 MED ORDER — NITROGLYCERIN 0.4 MG SL SUBL: 0.8000 mg | SUBLINGUAL_TABLET | Freq: Once | SUBLINGUAL | Status: DC

## 2022-06-06 ENCOUNTER — Encounter: Payer: Self-pay | Admitting: Internal Medicine

## 2022-06-06 DIAGNOSIS — E782 Mixed hyperlipidemia: Secondary | ICD-10-CM

## 2022-06-07 MED ORDER — METOPROLOL TARTRATE 25 MG PO TABS: 12.5000 mg | ORAL_TABLET | Freq: Two times a day (BID) | ORAL | 3 refills | Status: DC

## 2022-07-06 ENCOUNTER — Other Ambulatory Visit: Payer: Self-pay | Admitting: Internal Medicine

## 2022-08-31 ENCOUNTER — Encounter: Payer: Self-pay | Admitting: Internal Medicine

## 2022-08-31 ENCOUNTER — Ambulatory Visit: Payer: BC Managed Care – PPO | Attending: Internal Medicine | Admitting: Internal Medicine

## 2022-08-31 VITALS — BP 114/65 | HR 56 | Ht 68.0 in | Wt 206.6 lb

## 2022-08-31 DIAGNOSIS — R0600 Dyspnea, unspecified: Secondary | ICD-10-CM | POA: Diagnosis not present

## 2022-08-31 DIAGNOSIS — I2510 Atherosclerotic heart disease of native coronary artery without angina pectoris: Secondary | ICD-10-CM

## 2022-08-31 DIAGNOSIS — E782 Mixed hyperlipidemia: Secondary | ICD-10-CM | POA: Diagnosis not present

## 2022-08-31 NOTE — Progress Notes (Signed)
OFFICE CONSULT NOTE  Chief Complaint:  Follow-up  Primary Care Physician: [name], Michele Fanny, MD  HPI:  Michele [name] is a 57 y.o. female who is being seen today for the evaluation of chest pain at the request of [name], Michele Fanny, MD.  This is a pleasant 57 year old female kindly referred for evaluation of chest pain.  Past medical history significant for anxiety, asthma, depression and recent diagnosis of bipolar disorder, hyperlipidemia, GERD, IBS and other medical problems.  Over the past several months she has had some progressive chest pressure as well as pain in her left neck rating down into the left arm.  This comes on a little more with exertion and is relieved at rest.  She and her husband have been doing more walking and on a concentrated program to try to lose weight.  She notes it is particularly difficult when she walks up an incline or stairs.  Family history significant for heart disease in her father who had A. fib heart failure and a pacemaker and early coronary disease in his 42s or 75s.  Her mom also has hyperlipidemia and she has a sister who has type 2 diabetes.  She has been on atorvastatin for some time 20 mg.  Interestingly a recent lipid profile in October 2019 showed total cholesterol 283, HDL 47, LDL 208 and triglycerides 139.  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  We did discuss diet at some length today.  Although she does eat some atherogenic foods, and general diet recently has been lower in cholesterol and leaner meats.  She says she eats a lot of venison as well as vegetables and chicken.  With regards to her chest pain.  She describes it as a pressure or squeezing sensation, associated with some shortness of breath.  In addition she has been having palpitations.  This is awakening her at night and use occurs around 3 to 3:30 AM.  This is about an hour  before her husband wakes up.  She denies any intrusive dreams.  Her husband notes that she does not snore heavily, gas or breath or have any other symptoms concerning for apnea.  05/18/2022  Michele [name] returns today for follow-up with her husband.  She has recently been having some sharp central to left-sided chest pains.  Symptoms are sporadic and not necessarily worse with exertion or relieved by rest.  She has had some interval weight loss about 8 to 10 pounds.  She has felt worsening fatigue and exercise intolerance.  Heart rate is in the low 50s today with a low normal blood pressure 109/68.  She is on metoprolol.  She had a coronary CT angiogram back in June 2020.  This demonstrated a calcium score of 129, 97th percentile for age and matched controls, however mild nonobstructive coronary disease was noted.  She has had excellent blood pressure control.  Her cholesterol also is reasonably well treated with total 148, HDL 47, triglycerides 115 and LDL 78.  Her target LDL is less than 70.  08/31/2022  Michele [name] is seen today in follow-up. She is feeling somewhat better. Her repeat CT coronary angiogram in December showed no obstructive CAD, but her calcium score has progressed to 352, now 98th percentile for age and sex matched control. Her heartrate and BP are better today. She never decreased her medication as I had suggested. We were also supposed to  review labs, but that has not been drawn.  PMHx:  Past Medical History:  Diagnosis Date   Abnormal Pap smear of cervix    cryotx   Anxiety    Arthritis    Asthma    Depression    Endometriosis    Fatty liver    Gastric polyp    GERD (gastroesophageal reflux disease)    Hemorrhoids    Hiatal hernia    HLD (hyperlipidemia)    Hyperplastic colon polyp    IBS (irritable bowel syndrome)    Iron deficiency anemia 1/12   Pneumonia    Rectocele    Rotator cuff tear, left 2009   Syncope    Tachycardia    s/p negative cardiac work up with  echo   Uterine fibroid [id number]    Past Surgical History:  Procedure Laterality Date   CARPAL TUNNEL RELEASE Bilateral    COLONOSCOPY  1999   small polyps, hemms   ESOPHAGOGASTRODUODENOSCOPY  2/12   gastric polyp and HH   HYSTEROSCOPY     LAPAROSCOPY  2006   endometriosis   ROTATOR CUFF REPAIR Left    TRIGGER FINGER RELEASE      FAMHx:  Family History  Problem Relation Age of Onset   Hypertension Father    Diabetes Father    Stroke Father    Migraines Father    Barrett's esophagus Father    Anemia Father        iron def   Heart disease Father        pacemaker, a fib, CHF   Bipolar disorder Father    Dementia Mother    Allergic rhinitis Mother    Diabetes Sister    Hyperlipidemia Sister    Deep vein thrombosis Sister    Anxiety disorder Daughter    Bipolar disorder Daughter    ADD / ADHD Daughter    Neuropathy Daughter    Syncope episode Daughter    Diabetes Paternal Grandfather    COPD Paternal Grandmother    Breast cancer Paternal Grandmother    Angioedema Neg Hx    Asthma Neg Hx    Atopy Neg Hx    Eczema Neg Hx    Immunodeficiency Neg Hx    Urticaria Neg Hx     SOCHx:   reports that she quit smoking about 30 years ago. Her smoking use included cigarettes. She has a 1.25 pack-year smoking history. She has been exposed to tobacco smoke. She has never used smokeless tobacco. She reports current alcohol use of about 1.0 standard drink of alcohol per week. She reports that she does not use drugs.  ALLERGIES:  Allergies  Allergen Reactions   Codeine Itching   Hydrocodone-Acetaminophen Itching   Lactose Intolerance (Gi)    Other    Povidone-Iodine Rash        Propoxyphene N-Acetaminophen Itching         ROS: Pertinent items noted in HPI and remainder of comprehensive ROS otherwise negative.  HOME MEDS: Current Outpatient Medications on File Prior to Visit  Medication Sig Dispense Refill   albuterol (VENTOLIN HFA) 108 (90 Base) MCG/ACT inhaler INHALE  2 PUFFS INTO THE LUNGS EVERY 4 HOURS AS NEEDED FOR WHEEZE 8 g 2   ARIPiprazole (ABILIFY) 5 MG tablet Take 5 mg by mouth every morning.     aspirin EC 81 MG tablet Take 81 mg by mouth daily.     atorvastatin (LIPITOR) 80 MG tablet TAKE 1 TABLET (80 MG TOTAL) BY MOUTH DAILY.  SCHEDULE OFFICE VISIT FOR FUTURE REFILLS THIRD ATTEMPT 90 tablet 1   CALCIUM PO Take by mouth.     cholecalciferol (VITAMIN D3) 25 MCG (1000 UNIT) tablet Take 1,000 Units by mouth daily.     diphenhydrAMINE (BENADRYL) 25 MG tablet Take 75 mg by mouth at bedtime as needed for sleep.     doxycycline (VIBRA-TABS) 100 MG tablet Take 1 tablet (100 mg total) by mouth 2 (two) times daily. 20 tablet 0   esomeprazole (NEXIUM) 40 MG capsule TAKE 1 CAPSULE BY MOUTH EVERY DAY IN THE MORNING 90 capsule 2   FLUoxetine (PROZAC) 20 MG capsule Take 40 mg by mouth daily.     fluticasone (FLONASE) 50 MCG/ACT nasal spray Place 1 spray into both nostrils daily for 14 days. 16 g 0   lamoTRIgine (LAMICTAL) 25 MG tablet Take 25 mg by mouth daily.     meclizine (ANTIVERT) 25 MG tablet Take 1 tablet (25 mg total) by mouth 3 (three) times daily as needed for dizziness or nausea. 30 tablet 0   metoprolol tartrate (LOPRESSOR) 25 MG tablet TAKE 1 TABLET BY MOUTH TWICE A DAY 180 tablet 3   montelukast (SINGULAIR) 10 MG tablet TAKE 1 TABLET BY MOUTH EVERYDAY AT BEDTIME 90 tablet 1   naproxen sodium (ALEVE) 220 MG tablet Take 440 mg by mouth daily as needed (pain).     Omega-3 Fatty Acids (FISH OIL OMEGA-3 PO) Take by mouth.     traZODone (DESYREL) 50 MG tablet Take 50 mg by mouth at bedtime.     vitamin B-12 (CYANOCOBALAMIN) 500 MCG tablet Take 500 mcg by mouth daily.     vitamin C (ASCORBIC ACID) 500 MG tablet Take 500 mg by mouth daily.     vitamin E (VITAMIN E) 400 UNIT capsule Take 2 capsules (800 Units total) by mouth daily. 60 capsule 5   zinc gluconate 50 MG tablet Take 50 mg by mouth daily.     FLUoxetine (PROZAC) 10 MG capsule Take 10 mg by mouth  daily. (Patient not taking: Reported on 08/31/2022)     [DISCONTINUED] norethindrone-ethinyl estradiol-iron (MICROGESTIN FE1.5/30) 1.5-30 MG-MCG tablet Take 1 tablet by mouth daily.       No current facility-administered medications on file prior to visit.    LABS/IMAGING: No results found for this or any previous visit (from the past 48 hour(s)). No results found.  LIPID PANEL:    Component Value Date/Time   CHOL 148 01/27/2022 0800   CHOL 149 11/11/2018 1004   TRIG 115.0 01/27/2022 0800   HDL 47.40 01/27/2022 0800   HDL 55 11/11/2018 1004   CHOLHDL 3 01/27/2022 0800   VLDL 23.0 01/27/2022 0800   LDLCALC 78 01/27/2022 0800   LDLCALC 76 11/11/2018 1004   LDLDIRECT 202.8 03/11/2012 1353    WEIGHTS: Wt Readings from Last 3 Encounters:  08/31/22 206 lb 9.6 oz (93.7 kg)  05/18/22 203 lb (92.1 kg)  05/04/22 205 lb (93 kg)    VITALS: BP 114/65 (BP Location: Left Arm, Patient Position: Sitting, Cuff Size: Large)   Pulse (!) 56   Ht '5\' 8"'$  (1.727 m)   Wt 206 lb 9.6 oz (93.7 kg)   LMP 03/06/2014   SpO2 95%   BMI 31.41 kg/m   EXAM: General appearance: alert and no distress Neck: no carotid bruit, no JVD and thyroid not enlarged, symmetric, no tenderness/mass/nodules Lungs: clear to auscultation bilaterally Heart: regular rate and rhythm, S1, S2 normal, no murmur, click, rub or gallop Abdomen: soft,  non-tender; bowel sounds normal; no masses,  no organomegaly Extremities: extremities normal, atraumatic, no cyanosis or edema Pulses: 2+ and symmetric Skin: Skin color, texture, turgor normal. No rashes or lesions Neurologic: Grossly normal Psych: Pleasant  EKG: Normal sinus rhythm at 65 - personally reviewed  ASSESSMENT: Chest pain, possibly concerning for unstable angina Abnormal coronary CTA with CAC score 129, 97th percentile, minimal nonobstructive coronary disease (12/2018) Possible familial hyperlipidemia-Dutch score 6 Family history of premature coronary  disease  PLAN: 1.   Mrs. Dillavou seems to be doing better. Her recent CT was non-obstructive, but her calcium score and percentile ranking is worse. I do want to re-assess her lipids and her LP(a). Will continue her current medications at this time. Follow-up in 6 months.  Pixie Casino, MD, [facility] [place] Director of the Advanced Lipid Disorders &  Cardiovascular Risk Reduction Clinic Diplomate of the American Board of Clinical Lipidology Attending Cardiologist  Direct Dial: [phone]  Fax: [phone]  Website:  [email or web address] 08/31/2022, 4:49 PM

## 2022-08-31 NOTE — Patient Instructions (Signed)
Medication Instructions:  NO CHANGES  *If you need a refill on your cardiac medications before your next appointment, please call your pharmacy*   Lab Work: FASTING lab work when able to check cholesterol   If you have labs (blood work) drawn today and your tests are completely normal, you will receive your results only by: [place] (if you have MyChart) OR A paper copy in the mail If you have any lab test that is abnormal or we need to change your treatment, we will call you to review the results.   Follow-Up: At [facility], you and your health needs are our priority.  As part of our continuing mission to provide you with exceptional heart care, we have created designated Provider Care Teams.  These Care Teams include your primary Cardiologist (physician) and Advanced Practice Providers (APPs -  Physician Assistants and Nurse Practitioners) who all work together to provide you with the care you need, when you need it.  We recommend signing up for the patient portal called "MyChart".  Sign up information is provided on this After Visit Summary.  MyChart is used to connect with patients for Virtual Visits (Telemedicine).  Patients are able to view lab/test results, encounter notes, upcoming appointments, etc.  Non-urgent messages can be sent to your provider as well.   To learn more about what you can do with MyChart, go to [email or web address].    Your next appointment:   6 month(s)  Lyman Bishop MD

## 2022-10-07 ENCOUNTER — Other Ambulatory Visit: Payer: Self-pay | Admitting: Family Medicine

## 2022-10-07 DIAGNOSIS — J4520 Mild intermittent asthma, uncomplicated: Secondary | ICD-10-CM

## 2022-11-05 IMAGING — MG MM DIGITAL SCREENING BILAT W/ TOMO AND CAD
6 of 12 series · 6 of 36 positions shown · non-contrast
Comparison: Previous exam(s).

CLINICAL DATA: Screening.

EXAM:
DIGITAL SCREENING BILATERAL MAMMOGRAM WITH TOMOSYNTHESIS AND CAD
TECHNIQUE: Bilateral screening digital craniocaudal and mediolateral oblique
mammograms were obtained. Bilateral screening digital breast
tomosynthesis was performed. The images were evaluated with
computer-aided detection.

[L CC synth-2D (1 of 2)]
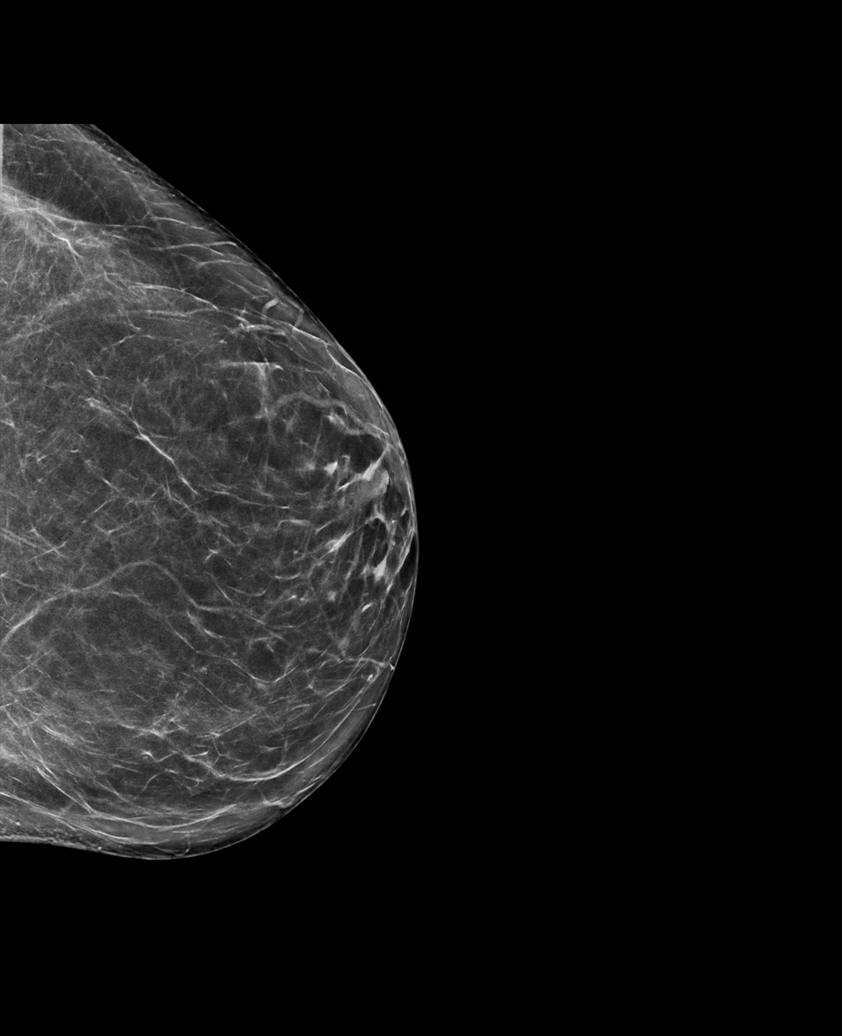

[L CC synth-2D (2 of 2)]
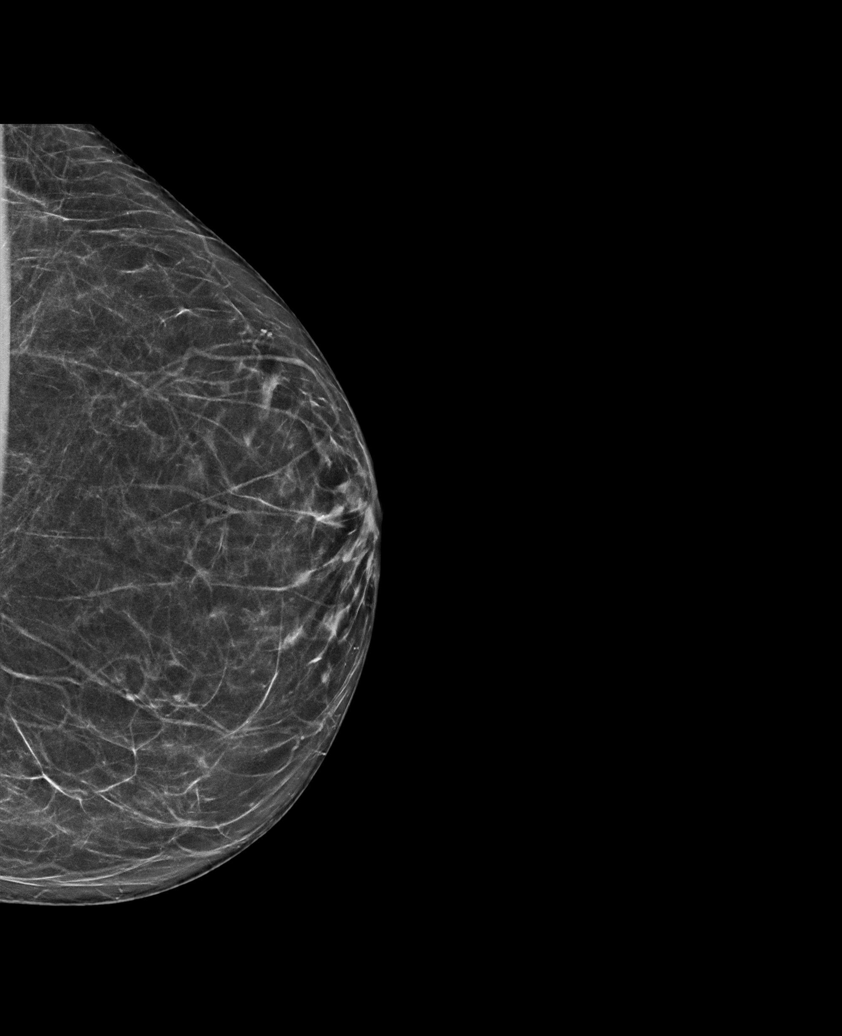

[L MLO synth-2D]
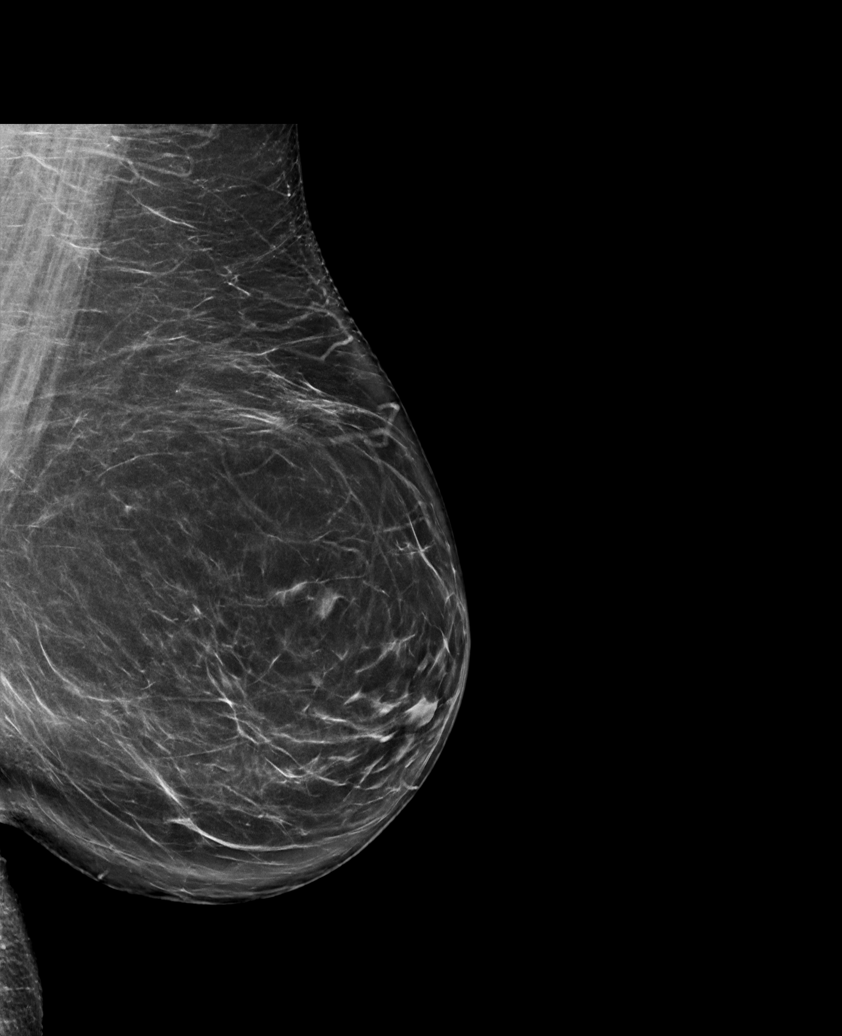

[R CC synth-2D (1 of 2)]
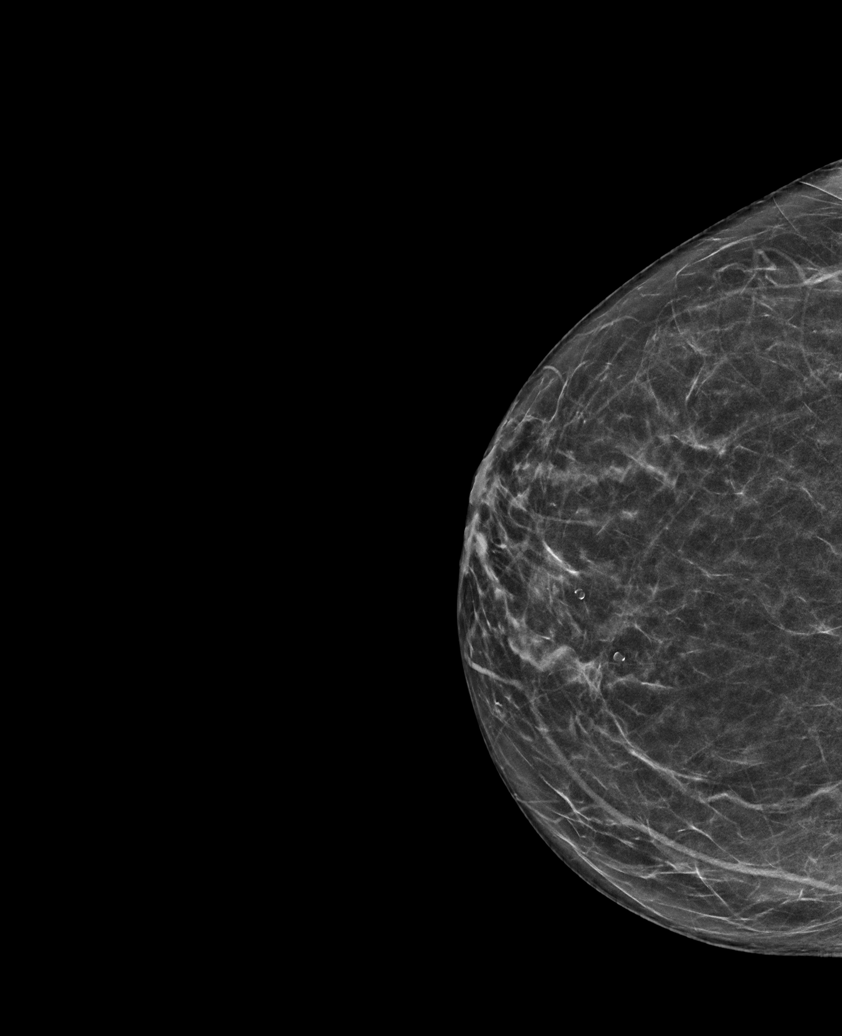

[R CC synth-2D (2 of 2)]
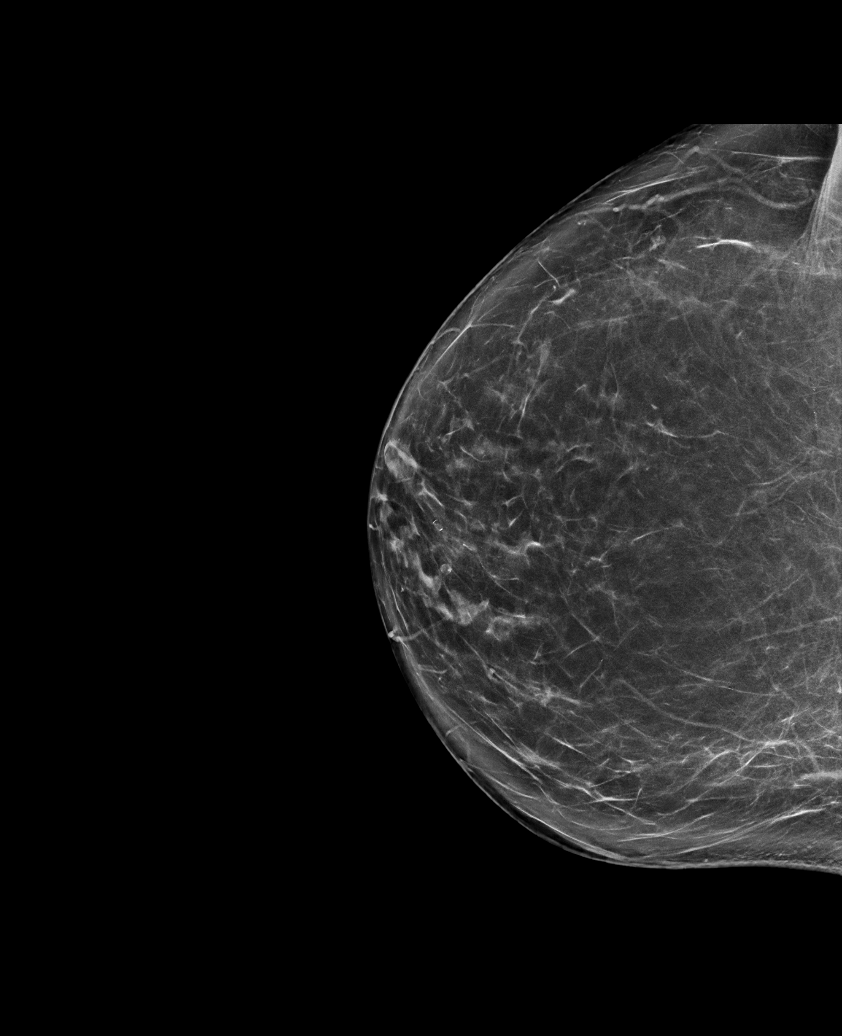

[R MLO synth-2D]
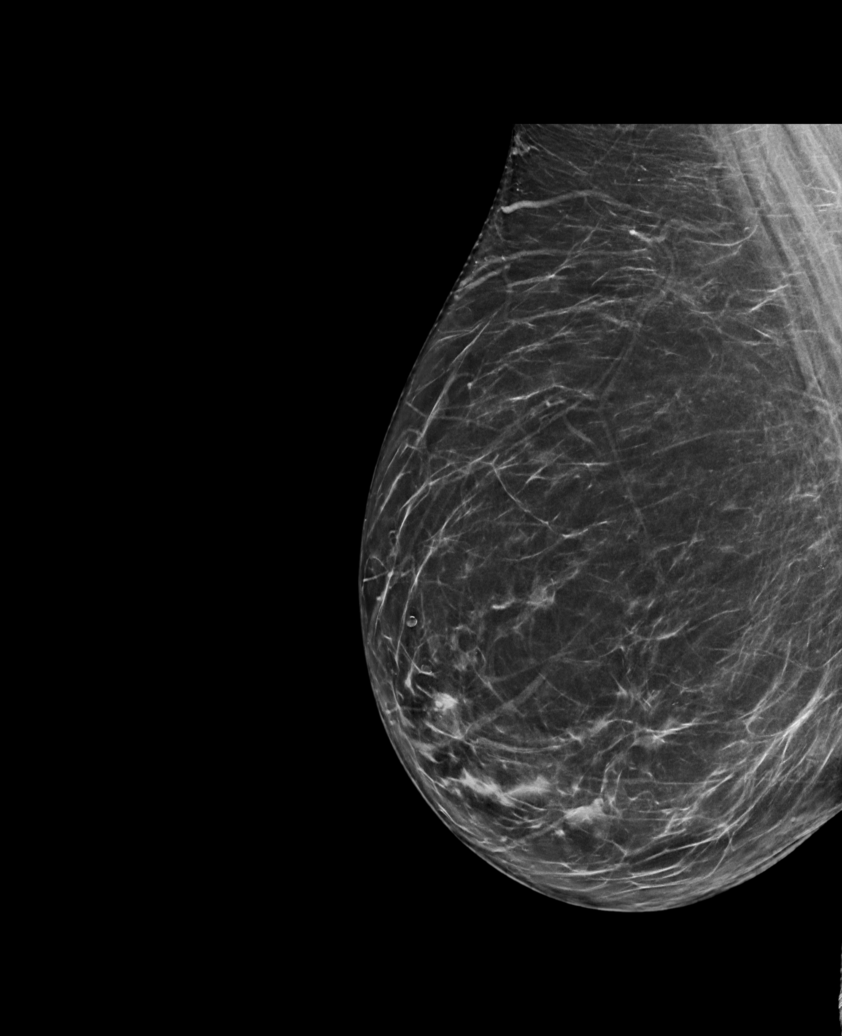

[6 of 36 positions shown; findings below may reference images not displayed]

ACR Breast Density Category b: There are scattered areas of
fibroglandular density.
FINDINGS: There are no findings suspicious for malignancy. The images were
evaluated with computer-aided detection.
IMPRESSION: No mammographic evidence of malignancy. A result letter of this
screening mammogram will be mailed directly to the patient.

RECOMMENDATION:
Screening mammogram in one year. (Code:[id number])

BI-RADS CATEGORY  1: Negative.

## 2022-12-02 ENCOUNTER — Other Ambulatory Visit: Payer: Self-pay | Admitting: Internal Medicine

## 2023-01-10 ENCOUNTER — Other Ambulatory Visit: Payer: Self-pay | Admitting: Family Medicine

## 2023-01-10 NOTE — Telephone Encounter (Signed)
Please call patient per Dr. Milinda Antis note on 01/31/22 patient was to follow up in 3 months with labs before. Send back to her pool once scheduled to process refill.

## 2023-01-10 NOTE — Telephone Encounter (Signed)
Lvmtcb, sent mychart message  

## 2023-01-11 NOTE — Telephone Encounter (Signed)
F/u scheduled for 01/26/23

## 2023-01-22 ENCOUNTER — Telehealth: Payer: Self-pay | Admitting: Family Medicine

## 2023-01-22 DIAGNOSIS — Z79899 Other long term (current) drug therapy: Secondary | ICD-10-CM

## 2023-01-22 DIAGNOSIS — Z0000 Encounter for general adult medical examination without abnormal findings: Secondary | ICD-10-CM

## 2023-01-22 DIAGNOSIS — R7989 Other specified abnormal findings of blood chemistry: Secondary | ICD-10-CM

## 2023-01-22 DIAGNOSIS — R7303 Prediabetes: Secondary | ICD-10-CM

## 2023-01-22 DIAGNOSIS — E782 Mixed hyperlipidemia: Secondary | ICD-10-CM

## 2023-01-22 DIAGNOSIS — E538 Deficiency of other specified B group vitamins: Secondary | ICD-10-CM

## 2023-01-22 NOTE — Telephone Encounter (Signed)
-----   Message from Alvina Chou sent at 01/12/2023  3:04 PM EDT ----- Regarding: Lab orders for Tuesday, 7.23.24 Lab orders for f/u appt

## 2023-01-23 ENCOUNTER — Other Ambulatory Visit: Payer: BC Managed Care – PPO

## 2023-01-26 ENCOUNTER — Ambulatory Visit: Payer: BC Managed Care – PPO | Admitting: Family Medicine

## 2023-02-20 ENCOUNTER — Other Ambulatory Visit (INDEPENDENT_AMBULATORY_CARE_PROVIDER_SITE_OTHER): Payer: BC Managed Care – PPO

## 2023-02-20 DIAGNOSIS — R7989 Other specified abnormal findings of blood chemistry: Secondary | ICD-10-CM | POA: Diagnosis not present

## 2023-02-20 DIAGNOSIS — R7303 Prediabetes: Secondary | ICD-10-CM | POA: Diagnosis not present

## 2023-02-20 DIAGNOSIS — E782 Mixed hyperlipidemia: Secondary | ICD-10-CM

## 2023-02-20 DIAGNOSIS — Z79899 Other long term (current) drug therapy: Secondary | ICD-10-CM

## 2023-02-20 DIAGNOSIS — E538 Deficiency of other specified B group vitamins: Secondary | ICD-10-CM

## 2023-02-20 DIAGNOSIS — Z0000 Encounter for general adult medical examination without abnormal findings: Secondary | ICD-10-CM | POA: Diagnosis not present

## 2023-02-20 LAB — CBC WITH DIFFERENTIAL/PLATELET
Basophils Absolute: 0 10*3/uL (ref 0.0–0.1)
Basophils Relative: 0.7 % (ref 0.0–3.0)
Eosinophils Absolute: 0.1 10*3/uL (ref 0.0–0.7)
Eosinophils Relative: 2.7 % (ref 0.0–5.0)
HCT: 40.9 % (ref 36.0–46.0)
Hemoglobin: 13.3 g/dL (ref 12.0–15.0)
Lymphocytes Relative: 29.2 % (ref 12.0–46.0)
Lymphs Abs: 1.6 10*3/uL (ref 0.7–4.0)
MCHC: 32.5 g/dL (ref 30.0–36.0)
MCV: 91.5 fl (ref 78.0–100.0)
Monocytes Absolute: 0.4 10*3/uL (ref 0.1–1.0)
Monocytes Relative: 7.7 % (ref 3.0–12.0)
Neutro Abs: 3.3 10*3/uL (ref 1.4–7.7)
Neutrophils Relative %: 59.7 % (ref 43.0–77.0)
Platelets: 230 10*3/uL (ref 150.0–400.0)
RBC: 4.47 Mil/uL (ref 3.87–5.11)
RDW: 13.8 % (ref 11.5–15.5)
WBC: 5.5 10*3/uL (ref 4.0–10.5)

## 2023-02-20 LAB — LIPID PANEL
Cholesterol: 140 mg/dL (ref 0–200)
HDL: 50.6 mg/dL (ref >39.00)
LDL Cholesterol: 69 mg/dL (ref 0–99)
NonHDL: 89.19
Total CHOL/HDL Ratio: 3
Triglycerides: 103 mg/dL (ref 0.0–149.0)
VLDL: 20.6 mg/dL (ref 0.0–40.0)

## 2023-02-20 LAB — COMPREHENSIVE METABOLIC PANEL
ALT: 37 U/L — ABNORMAL HIGH (ref 0–35)
AST: 27 U/L (ref 0–37)
Albumin: 4.1 g/dL (ref 3.5–5.2)
Alkaline Phosphatase: 119 U/L — ABNORMAL HIGH (ref 39–117)
BUN: 11 mg/dL (ref 6–23)
CO2: 30 mEq/L (ref 19–32)
Calcium: 9.3 mg/dL (ref 8.4–10.5)
Chloride: 104 mEq/L (ref 96–112)
Creatinine, Ser: 0.89 mg/dL (ref 0.40–1.20)
GFR: 71.59 mL/min (ref >60.00)
Glucose, Bld: 171 mg/dL — ABNORMAL HIGH (ref 70–99)
Potassium: 4.1 mEq/L (ref 3.5–5.1)
Sodium: 142 mEq/L (ref 135–145)
Total Bilirubin: 0.8 mg/dL (ref 0.2–1.2)
Total Protein: 6 g/dL (ref 6.0–8.3)

## 2023-02-20 LAB — T4, FREE: Free T4: 1.64 ng/dL — ABNORMAL HIGH (ref 0.60–1.60)

## 2023-02-20 LAB — TSH: TSH: 2.88 u[IU]/mL (ref 0.35–5.50)

## 2023-02-20 LAB — VITAMIN D 25 HYDROXY (VIT D DEFICIENCY, FRACTURES): VITD: 62.41 ng/mL (ref 30.00–100.00)

## 2023-02-20 LAB — VITAMIN B12: Vitamin B-12: 849 pg/mL (ref 211–911)

## 2023-02-20 LAB — HEMOGLOBIN A1C: Hgb A1c MFr Bld: 6.1 % (ref 4.6–6.5)

## 2023-02-23 ENCOUNTER — Encounter: Payer: Self-pay | Admitting: Family Medicine

## 2023-02-23 ENCOUNTER — Ambulatory Visit: Payer: BC Managed Care – PPO | Admitting: Family Medicine

## 2023-02-23 VITALS — BP 124/76 | HR 69 | Temp 97.8°F | Ht 67.25 in | Wt 202.4 lb

## 2023-02-23 DIAGNOSIS — R7989 Other specified abnormal findings of blood chemistry: Secondary | ICD-10-CM

## 2023-02-23 DIAGNOSIS — F32A Depression, unspecified: Secondary | ICD-10-CM

## 2023-02-23 DIAGNOSIS — E782 Mixed hyperlipidemia: Secondary | ICD-10-CM

## 2023-02-23 DIAGNOSIS — Z9181 History of falling: Secondary | ICD-10-CM | POA: Insufficient documentation

## 2023-02-23 DIAGNOSIS — Z0000 Encounter for general adult medical examination without abnormal findings: Secondary | ICD-10-CM | POA: Diagnosis not present

## 2023-02-23 DIAGNOSIS — F419 Anxiety disorder, unspecified: Secondary | ICD-10-CM | POA: Diagnosis not present

## 2023-02-23 DIAGNOSIS — R5382 Chronic fatigue, unspecified: Secondary | ICD-10-CM

## 2023-02-23 DIAGNOSIS — E538 Deficiency of other specified B group vitamins: Secondary | ICD-10-CM

## 2023-02-23 DIAGNOSIS — Z79899 Other long term (current) drug therapy: Secondary | ICD-10-CM

## 2023-02-23 DIAGNOSIS — H538 Other visual disturbances: Secondary | ICD-10-CM

## 2023-02-23 DIAGNOSIS — Z1211 Encounter for screening for malignant neoplasm of colon: Secondary | ICD-10-CM

## 2023-02-23 DIAGNOSIS — F3162 Bipolar disorder, current episode mixed, moderate: Secondary | ICD-10-CM | POA: Diagnosis not present

## 2023-02-23 DIAGNOSIS — K760 Fatty (change of) liver, not elsewhere classified: Secondary | ICD-10-CM

## 2023-02-23 DIAGNOSIS — R7303 Prediabetes: Secondary | ICD-10-CM

## 2023-02-23 DIAGNOSIS — J4520 Mild intermittent asthma, uncomplicated: Secondary | ICD-10-CM

## 2023-02-23 DIAGNOSIS — K219 Gastro-esophageal reflux disease without esophagitis: Secondary | ICD-10-CM | POA: Diagnosis not present

## 2023-02-23 DIAGNOSIS — E669 Obesity, unspecified: Secondary | ICD-10-CM

## 2023-02-23 NOTE — Progress Notes (Unsigned)
Subjective:    Patient ID: Michele [name], female    DOB: 1966/06/08, 57 y.o.   MRN: [id number]  HPI  Here for health maintenance exam and to review chronic medical problems   Wt Readings from Last 3 Encounters:  02/23/23 202 lb 6 oz (91.8 kg)  08/31/22 206 lb 9.6 oz (93.7 kg)  05/18/22 203 lb (92.1 kg)   31.46 kg/m  Vitals:   02/23/23 0937  BP: 124/76  Pulse: 69  Temp: 97.8 F (36.6 C)  SpO2: 96%    Immunization History  Administered Date(s) Administered   Influenza Split 04/01/2012   Influenza Whole 04/18/2005, 04/30/2008, 04/07/2010   Influenza,inj,Quad PF,6+ Mos 04/23/2013, 03/02/2015, 04/04/2016, 04/04/2018, 04/08/2019, 03/25/2020   Influenza-Unspecified 04/07/2017   PFIZER(Purple Top)SARS-COV-2 Vaccination 10/20/2019, 11/12/2019   Pneumococcal Polysaccharide-23 04/30/2008   Rabies, IM 07/21/2016, 07/24/2016, 07/28/2016, 08/04/2016   Td 10/02/2003   Tdap 03/02/2015    Health Maintenance Due  Topic Date Due   Zoster Vaccines- Shingrix (1 of 2) Never done   Colonoscopy  08/17/2020   PAP SMEAR-Modifier  03/26/2023   Some new issues with blurred vision  Lethargic- some fatigue and lack of motivation   Lost mom in 2021  Dad just passed in July  Lost uncle and best friend recently  Plans to get in touch with grief counseling through funeral    Shingrix- is interested/ will check coverage   Mammogram 04/2022  Self breast exam  Gyn health Pap 03/2020 gyn , hpv neg and normal Endometriosis    Colon cancer screening - 08/2010 ? If she cannot afford it  Is expensive   Bone health   Falls-has a lot of falls (last wet grass) , also does not see well in the dark  Fractures- fracture wrist in past several years Supplements -vitamin D Exercise : needs to start doing something  Used to walk   Asthma is worse in the heat and this limit her    Mood    01/31/2022   11:09 AM 11/11/2020    9:13 AM 08/25/2019    9:29 AM 08/28/2018    6:51 PM 04/04/2018     9:20 AM  Depression screen PHQ 2/9  Decreased Interest 0 0 1  2  Down, Depressed, Hopeless 0 0 1  2  PHQ - 2 Score 0 0 2  4  Altered sleeping   1  3  Tired, decreased energy   1  3  Change in appetite   1  3  Feeling bad or failure about yourself    1  3  Trouble concentrating   1  3  Moving slowly or fidgety/restless   0  2  Suicidal thoughts   1  1  PHQ-9 Score   8  22  Difficult doing work/chores   Somewhat difficult       Information is confidential and restricted. Go to Review Flowsheets to unlock data.   Under psych care for bipolar disorder with anx/dep  GERD Nexium 40 mg daily  Lab Results  Component Value Date   VITAMINB12 849 02/20/2023   History of B12 def   Prediabetes Lab Results  Component Value Date   HGBA1C 6.1 02/20/2023  Improved from 6.5   Glucose was 171 - after coffee with creamer  No steroids recently  No steroid shots     Hyperlipidemia Lab Results  Component Value Date   CHOL 140 02/20/2023   CHOL 148 01/27/2022   CHOL 138 12/15/2020  Lab Results  Component Value Date   HDL 50.60 02/20/2023   HDL 47.40 01/27/2022   HDL 50.90 12/15/2020   Lab Results  Component Value Date   LDLCALC 69 02/20/2023   LDLCALC 78 01/27/2022   LDLCALC 61 12/15/2020   Lab Results  Component Value Date   TRIG 103.0 02/20/2023   TRIG 115.0 01/27/2022   TRIG 130.0 12/15/2020   Lab Results  Component Value Date   CHOLHDL 3 02/20/2023   CHOLHDL 3 01/27/2022   CHOLHDL 3 12/15/2020   Lab Results  Component Value Date   LDLDIRECT 202.8 03/11/2012   LDLDIRECT 198.5 02/04/2008   LDLDIRECT 171.7 10/30/2007   Atorvastatin 80 mg daily   Overall better   Thyroid Lab Results  Component Value Date   TSH 2.88 02/20/2023   Free T4 was slightly 1.64 Was taking a supplement with biotin   Liver Fatty on [place] in past  Lab Results  Component Value Date   ALT 37 (H) 02/20/2023   AST 27 02/20/2023   ALKPHOS 119 (H) 02/20/2023   BILITOT 0.8  02/20/2023      Patient Active Problem List   Diagnosis Date Noted   Colon cancer screening 01/31/2022   Encounter for screening mammogram for breast cancer 01/31/2022   Vitamin B12 deficiency 12/22/2020   Elevated TSH 12/22/2020   Current use of proton pump inhibitor 12/14/2020   Ingrown toenail of right foot 12/02/2020   Onychomycosis 12/02/2020   Poison oak dermatitis 10/29/2019   Meralgia paresthetica 10/15/2019   Bipolar 1 disorder, mixed, moderate (HCC) 08/25/2019   Family history of DVT 08/25/2019   BPV (benign positional vertigo) 11/04/2018   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   Shortness of breath on exertion 08/27/2018   Allergic rhinitis 01/10/2017   Adjustment reaction with anxious mood 08/18/2015   Arm paresthesia, right 07/14/2015   Rectal pressure 10/01/2013   Rectocele 10/01/2013   Mixed incontinence 04/23/2013   Encounter for routine gynecological examination 04/01/2012   Prediabetes 04/01/2012   Routine general medical examination at a health care facility 03/11/2012   GERD 08/26/2010   History of anemia 07/19/2010   FREQUENCY, URINARY 07/06/2010   MICROSCOPIC HEMATURIA 06/09/2010   History of colonic polyps 11/15/2007   IBS 10/08/2007   ENDOMETRIOSIS 10/08/2007   Mixed hyperlipidemia 10/30/2006   Anxiety and depression 10/30/2006   HEMORRHOIDS, INTERNAL 10/30/2006   Asthma 10/30/2006   MIGRAINES, HX OF 10/30/2006   Past Medical History:  Diagnosis Date   Abnormal Pap smear of cervix    cryotx   Anxiety    Arthritis    Asthma    Depression    Endometriosis    Fatty liver    Gastric polyp    GERD (gastroesophageal reflux disease)    Hemorrhoids    Hiatal hernia    HLD (hyperlipidemia)    Hyperplastic colon polyp    IBS (irritable bowel syndrome)    Iron deficiency anemia 1/12   Pneumonia    Rectocele    Rotator cuff tear, left 2009   Syncope    Tachycardia    s/p negative cardiac work up with echo   Uterine fibroid [id number]    Past Surgical History:  Procedure Laterality Date   CARPAL TUNNEL RELEASE Bilateral    COLONOSCOPY  1999   small polyps, hemms   ESOPHAGOGASTRODUODENOSCOPY  2/12   gastric polyp and HH   HYSTEROSCOPY     LAPAROSCOPY  2006   endometriosis   ROTATOR CUFF REPAIR Left  TRIGGER FINGER RELEASE     Social History   Tobacco Use   Smoking status: Former    Current packs/day: 0.00    Average packs/day: 0.3 packs/day for 5.0 years (1.3 ttl pk-yrs)    Types: Cigarettes    Start date: 07/04/1987    Quit date: 07/03/1992    Years since quitting: 30.6    Passive exposure: Past   Smokeless tobacco: Never  Vaping Use   Vaping status: Never Used  Substance Use Topics   Alcohol use: Yes    Alcohol/week: 1.0 standard drink of alcohol    Types: 1 Glasses of wine per week   Drug use: No   Family History  Problem Relation Age of Onset   Hypertension Father    Diabetes Father    Stroke Father    Migraines Father    Barrett's esophagus Father    Anemia Father        iron def   Heart disease Father        pacemaker, a fib, CHF   Bipolar disorder Father    Dementia Mother    Allergic rhinitis Mother    Diabetes Sister    Hyperlipidemia Sister    Deep vein thrombosis Sister    Anxiety disorder Daughter    Bipolar disorder Daughter    ADD / ADHD Daughter    Neuropathy Daughter    Syncope episode Daughter    Diabetes Paternal Grandfather    COPD Paternal Grandmother    Breast cancer Paternal Grandmother    Angioedema Neg Hx    Asthma Neg Hx    Atopy Neg Hx    Eczema Neg Hx    Immunodeficiency Neg Hx    Urticaria Neg Hx    Allergies  Allergen Reactions   Codeine Itching   Hydrocodone-Acetaminophen Itching   Lactose Intolerance (Gi)    Other    Povidone-Iodine Rash        Propoxyphene N-Acetaminophen Itching        Tolmetin Nausea And Vomiting   Current Outpatient Medications on File Prior to Visit  Medication Sig Dispense Refill   albuterol (VENTOLIN HFA) 108 (90  Base) MCG/ACT inhaler INHALE 2 PUFFS INTO THE LUNGS EVERY 4 HOURS AS NEEDED FOR WHEEZE 8 g 2   aspirin EC 81 MG tablet Take 81 mg by mouth daily.     atorvastatin (LIPITOR) 80 MG tablet TAKE 1 TABLET (80 MG TOTAL) BY MOUTH DAILY. SCHEDULE OFFICE VISIT FOR FUTURE REFILLS. THIRD ATTEMPT. 90 tablet 3   CALCIUM PO Take by mouth.     cholecalciferol (VITAMIN D3) 25 MCG (1000 UNIT) tablet Take 1,000 Units by mouth daily.     diphenhydrAMINE (BENADRYL) 25 MG tablet Take 75 mg by mouth at bedtime as needed for sleep.     esomeprazole (NEXIUM) 40 MG capsule TAKE 1 CAPSULE BY MOUTH EVERY DAY IN THE MORNING 90 capsule 0   FLUoxetine (PROZAC) 20 MG capsule Take 40 mg by mouth daily.     lamoTRIgine (LAMICTAL) 25 MG tablet Take 25 mg by mouth daily.     meclizine (ANTIVERT) 25 MG tablet Take 1 tablet (25 mg total) by mouth 3 (three) times daily as needed for dizziness or nausea. 30 tablet 0   montelukast (SINGULAIR) 10 MG tablet TAKE 1 TABLET BY MOUTH EVERYDAY AT BEDTIME 90 tablet 1   naproxen sodium (ALEVE) 220 MG tablet Take 440 mg by mouth daily as needed (pain).     Omega-3 Fatty Acids (FISH OIL  OMEGA-3 PO) Take by mouth.     traZODone (DESYREL) 50 MG tablet Take 50 mg by mouth at bedtime.     vitamin C (ASCORBIC ACID) 500 MG tablet Take 500 mg by mouth daily.     vitamin E (VITAMIN E) 400 UNIT capsule Take 2 capsules (800 Units total) by mouth daily. 60 capsule 5   zinc gluconate 50 MG tablet Take 50 mg by mouth daily.     fluticasone (FLONASE) 50 MCG/ACT nasal spray Place 1 spray into both nostrils daily for 14 days. 16 g 0   No current facility-administered medications on file prior to visit.    Review of Systems     Objective:   Physical Exam        Assessment & Plan:   Problem List Items Addressed This Visit   None

## 2023-02-23 NOTE — Patient Instructions (Addendum)
Get started with some grief counseling when you can   [facility]  [phone]  be able to book sooner appointments. You can reach [facility] at [phone].   If you find out you cannot afford the colonoscopy please let us know   Get an eye doctor appt    Consider indoor exercise  Elliptical Add some strength training to your routine, this is important for bone and brain health and can reduce your risk of falls and help your body use insulin properly and regulate weight  Light weights, exercise bands , and internet videos are a good way to start  Yoga (chair or regular), machines , floor exercises or a gym with machines are also good options    Try to get most of your carbohydrates from produce (with the exception of white potatoes) and whole grains Eat less bread/pasta/rice/snack foods/cereals/sweets and other items from the middle of the grocery store (processed carbs)    Call and schedule your mammogram  []   2D Mammogram  [x]   3D Mammogram  []   Bone Density     Please call for appointment:   []   [facility]  [address] [place] [address]  [phone]  []   [facility])   [address]  [place], [place] [address]  [phone]  [x]   [facility]      [address] [place], [place]        [phone]         []   [facility]  [address] [place], [place]  [phone]  []  [facility]   [address]   [place], [place] [address]  [phone]  []  [facility]  [address] [place], [place] [address] [phone]    Make sure to wear two piece clothing  No lotions powders or deodorants the day of the appointment Make sure to bring picture ID and insurance card.  Bring list of  medications you are currently taking including any supplements.   Schedule your screening mammogram through MyChart!   Select [place] imaging sites can now be scheduled through MyChart.  Log into your MyChart account.  Go to 'Visit' (or 'Appointments' if  on mobile App) --> Schedule an  Appointment  Under 'Select a Reason for Visit' choose the Mammogram  Screening option.  Complete the pre-visit questions  and select the time and place that  best fits your schedule

## 2023-02-24 NOTE — Assessment & Plan Note (Signed)
Continues singulair and prn albuterol mdi

## 2023-02-24 NOTE — Assessment & Plan Note (Addendum)
Reviewed health habits including diet and exercise and skin cancer prevention Reviewed appropriate screening tests for age  Also reviewed health mt list, fam hx and immunization status , as well as social and family history   See HPI Labs reviewed and ordered Planning eye specialist exam  Interested in shingrix of covered  Mammogram ordered-pt will schedule  Pap utd  Ref for screening colonoscopy if affordable  Discussed bone health PHQ 20 in setting of acute grief (planning counseling) and bipolar (under psychiatric care)

## 2023-02-24 NOTE — Assessment & Plan Note (Signed)
Recommend eye specialist appt-pt plans to schedule

## 2023-02-24 NOTE — Assessment & Plan Note (Signed)
Lab Results  Component Value Date   VITAMINB12 849 02/20/2023   Oral replacement On ppi

## 2023-02-24 NOTE — Assessment & Plan Note (Signed)
Lab Results  Component Value Date   HGBA1C 6.1 02/20/2023   Improved disc imp of low glycemic diet and wt loss to prevent DM2

## 2023-02-24 NOTE — Assessment & Plan Note (Signed)
Suspect multifactoral  Labs reviewed Grief/mood may play a role Continue to follow

## 2023-02-24 NOTE — Assessment & Plan Note (Signed)
ALT 37 Alk phos 119  No symptoms  Plans to work on weight loss with diet and exercise  Encouraged to avoid excessive tylenol or alcohol  Will continue to follow

## 2023-02-24 NOTE — Assessment & Plan Note (Signed)
Recommend grief counseling after losing both parents - may pursue this through the funeral home  Reviewed stressors/ coping techniques/symptoms/ support sources/ tx options and side effects in detail today Encouraged good self care  Continue treatment per psychiatry for this and bipolar disorder

## 2023-02-24 NOTE — Assessment & Plan Note (Signed)
Takex nexium 40 mg daily  Unable to comeoff of  B12 level is normal  Normal renal labs

## 2023-02-24 NOTE — Assessment & Plan Note (Signed)
Pt notes she falls easily  No recent fractures ? From busy schedule/ pre occupation Discussed fall prevention Follow up to discuss in more detail

## 2023-02-24 NOTE — Assessment & Plan Note (Signed)
 Discussed how this problem influences overall health and the risks it imposes  Reviewed plan for weight loss with lower calorie diet (via better food choices (lower glycemic and portion control) along with exercise building up to or more than 30 minutes 5 days per week including some aerobic activity and strength training

## 2023-02-24 NOTE — Assessment & Plan Note (Signed)
Lab Results  Component Value Date   VITAMINB12 849 02/20/2023   Last vitamin D Lab Results  Component Value Date   VD25OH 62.41 02/20/2023

## 2023-02-24 NOTE — Assessment & Plan Note (Signed)
Ref for colonoscopy  Will let us know if not affordable and go from there

## 2023-02-24 NOTE — Assessment & Plan Note (Signed)
This TSH no  FT4 up slightly - this is likely from biotin Will follow

## 2023-02-24 NOTE — Assessment & Plan Note (Signed)
Disc goals for lipids and reasons to control them Rev last labs with pt Rev low sat fat diet in detail  Continues atorvastatin 80 mg daily

## 2023-02-24 NOTE — Assessment & Plan Note (Signed)
Continue psychiatric care Lamictal Trazodone fluoxetins

## 2023-04-06 ENCOUNTER — Other Ambulatory Visit: Payer: Self-pay | Admitting: Family Medicine

## 2023-04-06 DIAGNOSIS — Z1211 Encounter for screening for malignant neoplasm of colon: Secondary | ICD-10-CM

## 2023-04-06 DIAGNOSIS — Z1212 Encounter for screening for malignant neoplasm of rectum: Secondary | ICD-10-CM

## 2023-04-11 ENCOUNTER — Other Ambulatory Visit: Payer: Self-pay | Admitting: Family Medicine

## 2023-04-11 DIAGNOSIS — J4520 Mild intermittent asthma, uncomplicated: Secondary | ICD-10-CM

## 2023-04-30 ENCOUNTER — Ambulatory Visit (INDEPENDENT_AMBULATORY_CARE_PROVIDER_SITE_OTHER)
Admission: RE | Admit: 2023-04-30 | Discharge: 2023-04-30 | Disposition: A | Discharge status: Home / Self Care | Payer: BC Managed Care – PPO | Source: Ambulatory Visit | Attending: Family Medicine | Admitting: Family Medicine

## 2023-04-30 ENCOUNTER — Ambulatory Visit: Payer: BC Managed Care – PPO | Admitting: Family Medicine

## 2023-04-30 ENCOUNTER — Encounter: Payer: Self-pay | Admitting: Family Medicine

## 2023-04-30 VITALS — BP 118/78 | HR 83 | Temp 98.6°F | Ht 67.25 in | Wt 201.5 lb

## 2023-04-30 DIAGNOSIS — R2689 Other abnormalities of gait and mobility: Secondary | ICD-10-CM | POA: Insufficient documentation

## 2023-04-30 DIAGNOSIS — M542 Cervicalgia: Secondary | ICD-10-CM | POA: Insufficient documentation

## 2023-04-30 DIAGNOSIS — Z9181 History of falling: Secondary | ICD-10-CM | POA: Diagnosis not present

## 2023-04-30 NOTE — Progress Notes (Signed)
Subjective:    Patient ID: Michele [name], female    DOB: 04-02-1966, 57 y.o.   MRN: [id number]  HPI  Wt Readings from Last 3 Encounters:  04/30/23 201 lb 8 oz (91.4 kg)  02/23/23 202 lb 6 oz (91.8 kg)  08/31/22 206 lb 9.6 oz (93.7 kg)   31.33 kg/m  Vitals:   04/30/23 1404  BP: 118/78  Pulse: 83  Temp: 98.6 F (37 C)  SpO2: 94%    Pt presents for c/o balance issues/ falling more frequently  Right neck pain after a fall   Last few years she falls more easily   Feels like equilibrium gets off   Not spinning sensation  Unsettling sensation in the dark  Has to grab hand rails   Especially going up or down  stair case (feels wobbly)  Last fall - wet grass and she slid and fell/ injured her neck (this was outlier- anyone would have fallen)  Was carrying bags    No neuropathy in feet  CTS in arms and hands    No one sided weakness or numbness -besides RUE from the fall   Fitness:  Not enough exercise Has treadmill and boflex machine-set it up in house (has not started yet)  Morning would be plan   Not a lot of vertigo lately =knows what to recognize   Vital signs are ok   History of meralgia paresthetica in past   History of vertigo in past  History of mood disorder / bipolar with grief - sees psychiatry   Potential sedating medicines  Benadryl- takes very seldomly  Meclizine - only for vertigo /has on hand - seldom  Fluoxetine (less likely)- and takes at night  lamictal - takes at night  Montelukast - in evening , for asthma and allergies -unsure if she can go without it  Allergy season- using inhaler more  In winter-may not need it as much for allergies  Last allergist thought it was helping   Trazodone -takes at night (would be open to cutting dose)  but does not sleep well  Taking for sleep         04/30/2023    3:21 PM 02/23/2023    9:55 AM 01/31/2022   11:09 AM 11/11/2020    9:13 AM 08/25/2019    9:29 AM  Depression screen PHQ 2/9   Decreased Interest 2 3 0 0 1  Down, Depressed, Hopeless 2 2 0 0 1  PHQ - 2 Score 4 5 0 0 2  Altered sleeping 3 2   1   Tired, decreased energy 3 3   1   Change in appetite 2 3   1   Feeling bad or failure about yourself  3 2   1   Trouble concentrating 3 3   1   Moving slowly or fidgety/restless 0 2   0  Suicidal thoughts 0 0   1  PHQ-9 Score 18 20   8   Difficult doing work/chores Very difficult Extremely dIfficult   Somewhat difficult      04/30/2023    3:21 PM 02/23/2023    9:55 AM 08/28/2018    6:51 PM  GAD 7 : Generalized Anxiety Score  Nervous, Anxious, on Edge 2 2   Control/stop worrying 3 3   Worry too much - different things 3 2   Trouble relaxing 3 2   Restless 2 2   Easily annoyed or irritable 2 2   Afraid - awful might happen 3 2  Total GAD 7 Score 18 15   Anxiety Difficulty Very difficult Extremely difficult      Information is confidential and restricted. Go to Review Flowsheets to unlock data.   Severe stress from election  Right neck pain since fall Not treating besides occational aleve  No heat or ice  Causes a tingly patch between neck and shoulder   Mid back bothers her more lately  Was in [facility] at 16  Right mid back/ ribs More sensitive lately     DG Cervical Spine Complete  Result Date: 04/30/2023 CLINICAL DATA:  Right neck pain, fell EXAM: CERVICAL SPINE - COMPLETE 4+ VIEW COMPARISON:  08/25/2003 FINDINGS: Frontal, bilateral oblique, and lateral views of the cervical spine are obtained. Alignment is anatomic to the cervicothoracic junction. There are no acute displaced fractures. Moderate lower cervical spondylosis most pronounced at C5-6, with symmetrical bilateral neural foraminal encroachment at that level. Remaining neural foramina are patent. Prevertebral soft tissues are unremarkable. Lung apices are clear. IMPRESSION: 1. No acute cervical spine fracture. 2. Lower cervical spondylosis, with symmetrical bilateral neural foraminal encroachment at  C5-6. Electronically Signed   By: Sharlet Salina M.D.   On: 04/30/2023 17:36      Patient Active Problem List   Diagnosis Date Noted   Neck pain on right side 04/30/2023   Poor balance 04/30/2023   Fatty liver 02/23/2023   Obesity (BMI 30-39.9) 02/23/2023   Blurry vision 02/23/2023   Personal history of fall 02/23/2023   Colon cancer screening 01/31/2022   Encounter for screening mammogram for breast cancer 01/31/2022   Vitamin B12 deficiency 12/22/2020   Elevated TSH 12/22/2020   Current use of proton pump inhibitor 12/14/2020   Ingrown toenail of right foot 12/02/2020   Fatigue 12/02/2020   Onychomycosis 12/02/2020   Meralgia paresthetica 10/15/2019   Bipolar 1 disorder, mixed, moderate (HCC) 08/25/2019   Family history of DVT 08/25/2019   BPV (benign positional vertigo) 11/04/2018   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   Allergic rhinitis 01/10/2017   Adjustment reaction with anxious mood 08/18/2015   Arm paresthesia, right 07/14/2015   Rectocele 10/01/2013   Mixed incontinence 04/23/2013   Encounter for routine gynecological examination 04/01/2012   Prediabetes 04/01/2012   Routine general medical examination at a health care facility 03/11/2012   GERD 08/26/2010   History of anemia 07/19/2010   FREQUENCY, URINARY 07/06/2010   MICROSCOPIC HEMATURIA 06/09/2010   History of colonic polyps 11/15/2007   IBS 10/08/2007   ENDOMETRIOSIS 10/08/2007   Mixed hyperlipidemia 10/30/2006   Anxiety and depression 10/30/2006   HEMORRHOIDS, INTERNAL 10/30/2006   Asthma 10/30/2006   MIGRAINES, HX OF 10/30/2006   Past Medical History:  Diagnosis Date   Abnormal Pap smear of cervix    cryotx   Anxiety    Arthritis    Asthma    Depression    Endometriosis    Fatty liver    Gastric polyp    GERD (gastroesophageal reflux disease)    Hemorrhoids    Hiatal hernia    HLD (hyperlipidemia)    Hyperplastic colon polyp    IBS (irritable bowel syndrome)    Iron deficiency  anemia 1/12   Pneumonia    Rectocele    Rotator cuff tear, left 2009   Syncope    Tachycardia    s/p negative cardiac work up with echo   Uterine fibroid [id number]   Past Surgical History:  Procedure Laterality Date   CARPAL TUNNEL RELEASE Bilateral    COLONOSCOPY  1999   small polyps, hemms   ESOPHAGOGASTRODUODENOSCOPY  2/12   gastric polyp and HH   HYSTEROSCOPY     LAPAROSCOPY  2006   endometriosis   ROTATOR CUFF REPAIR Left    TRIGGER FINGER RELEASE     Social History   Tobacco Use   Smoking status: Former    Current packs/day: 0.00    Average packs/day: 0.3 packs/day for 5.0 years (1.3 ttl pk-yrs)    Types: Cigarettes    Start date: 07/04/1987    Quit date: 07/03/1992    Years since quitting: 30.8    Passive exposure: Past   Smokeless tobacco: Never  Vaping Use   Vaping status: Never Used  Substance Use Topics   Alcohol use: Yes    Alcohol/week: 1.0 standard drink of alcohol    Types: 1 Glasses of wine per week   Drug use: No   Family History  Problem Relation Age of Onset   Hypertension Father    Diabetes Father    Stroke Father    Migraines Father    Barrett's esophagus Father    Anemia Father        iron def   Heart disease Father        pacemaker, a fib, CHF   Bipolar disorder Father    Dementia Mother    Allergic rhinitis Mother    Diabetes Sister    Hyperlipidemia Sister    Deep vein thrombosis Sister    Anxiety disorder Daughter    Bipolar disorder Daughter    ADD / ADHD Daughter    Neuropathy Daughter    Syncope episode Daughter    Diabetes Paternal Grandfather    COPD Paternal Grandmother    Breast cancer Paternal Grandmother    Angioedema Neg Hx    Asthma Neg Hx    Atopy Neg Hx    Eczema Neg Hx    Immunodeficiency Neg Hx    Urticaria Neg Hx    Allergies  Allergen Reactions   Codeine Itching   Hydrocodone-Acetaminophen Itching   Lactose Intolerance (Gi)    Other    Povidone-Iodine Rash        Propoxyphene N-Acetaminophen Itching         Tolmetin Nausea And Vomiting   Current Outpatient Medications on File Prior to Visit  Medication Sig Dispense Refill   albuterol (VENTOLIN HFA) 108 (90 Base) MCG/ACT inhaler INHALE 2 PUFFS INTO THE LUNGS EVERY 4 HOURS AS NEEDED FOR WHEEZE 8 g 2   aspirin EC 81 MG tablet Take 81 mg by mouth daily.     atorvastatin (LIPITOR) 80 MG tablet TAKE 1 TABLET (80 MG TOTAL) BY MOUTH DAILY. SCHEDULE OFFICE VISIT FOR FUTURE REFILLS. THIRD ATTEMPT. 90 tablet 3   CALCIUM PO Take by mouth.     cholecalciferol (VITAMIN D3) 25 MCG (1000 UNIT) tablet Take 1,000 Units by mouth daily.     diphenhydrAMINE (BENADRYL) 25 MG tablet Take 75 mg by mouth at bedtime as needed for sleep.     esomeprazole (NEXIUM) 40 MG capsule TAKE 1 CAPSULE BY MOUTH EVERY DAY IN THE MORNING 90 capsule 1   FLUoxetine (PROZAC) 40 MG capsule Take 40 mg by mouth daily.     lamoTRIgine (LAMICTAL) 100 MG tablet Take 100 mg by mouth at bedtime.     meclizine (ANTIVERT) 25 MG tablet Take 1 tablet (25 mg total) by mouth 3 (three) times daily as needed for dizziness or nausea. 30 tablet 0   montelukast (SINGULAIR)  10 MG tablet TAKE 1 TABLET BY MOUTH EVERYDAY AT BEDTIME 90 tablet 1   naproxen sodium (ALEVE) 220 MG tablet Take 440 mg by mouth daily as needed (pain).     Omega-3 Fatty Acids (FISH OIL OMEGA-3 PO) Take by mouth.     traZODone (DESYREL) 50 MG tablet Take 50 mg by mouth at bedtime.     vitamin C (ASCORBIC ACID) 500 MG tablet Take 500 mg by mouth daily.     vitamin E (VITAMIN E) 400 UNIT capsule Take 2 capsules (800 Units total) by mouth daily. 60 capsule 5   zinc gluconate 50 MG tablet Take 50 mg by mouth daily.     fluticasone (FLONASE) 50 MCG/ACT nasal spray Place 1 spray into both nostrils daily for 14 days. 16 g 0   No current facility-administered medications on file prior to visit.    Review of Systems  Constitutional:  Negative for activity change, appetite change, fatigue, fever and unexpected weight change.  HENT:   Negative for congestion, ear pain, rhinorrhea, sinus pressure and sore throat.   Eyes:  Negative for pain, redness and visual disturbance.  Respiratory:  Negative for cough, shortness of breath and wheezing.   Cardiovascular:  Negative for chest pain and palpitations.  Gastrointestinal:  Negative for abdominal pain, blood in stool, constipation and diarrhea.  Endocrine: Negative for polydipsia and polyuria.  Genitourinary:  Negative for dysuria, frequency and urgency.  Musculoskeletal:  Positive for neck pain. Negative for arthralgias, back pain, myalgias and neck stiffness.  Skin:  Negative for pallor and rash.  Allergic/Immunologic: Negative for environmental allergies.  Neurological:  Negative for dizziness, tremors, seizures, syncope, facial asymmetry, speech difficulty, weakness, light-headedness, numbness and headaches.       Poor balance Not dizzy today  Hematological:  Negative for adenopathy. Does not bruise/bleed easily.  Psychiatric/Behavioral:  Positive for dysphoric mood. Negative for decreased concentration. The patient is nervous/anxious.        Objective:   Physical Exam Constitutional:      General: She is not in acute distress.    Appearance: Normal appearance. She is well-developed. She is obese. She is not ill-appearing or diaphoretic.  HENT:     Head: Normocephalic and atraumatic.     Right Ear: Tympanic membrane and ear canal normal.     Left Ear: Tympanic membrane and ear canal normal.     Nose: Nose normal.     Mouth/Throat:     Mouth: Mucous membranes are moist.     Pharynx: Oropharynx is clear.  Eyes:     General: No scleral icterus.       Right eye: No discharge.        Left eye: No discharge.     Conjunctiva/sclera: Conjunctivae normal.     Pupils: Pupils are equal, round, and reactive to light.  Neck:     Thyroid: No thyromegaly.     Vascular: No carotid bruit or JVD.     Comments: Tender lower left cervical muscles and trapezius  No bony  tenderness Pain with left tilt and rotation  Mild crepitus  Normal rom ov shoulders   Cardiovascular:     Rate and Rhythm: Normal rate and regular rhythm.     Heart sounds: Normal heart sounds.     No gallop.  Pulmonary:     Effort: Pulmonary effort is normal. No respiratory distress.     Breath sounds: Normal breath sounds. No wheezing or rales.  Abdominal:     General:  There is no distension or abdominal bruit.     Palpations: Abdomen is soft.  Musculoskeletal:     Cervical back: Normal range of motion and neck supple. Tenderness present. No rigidity.     Right lower leg: No edema.     Left lower leg: No edema.  Lymphadenopathy:     Cervical: No cervical adenopathy.  Skin:    General: Skin is warm and dry.     Coloration: Skin is not jaundiced or pale.     Findings: No erythema or rash.  Neurological:     Mental Status: She is alert.     Cranial Nerves: No cranial nerve deficit.     Sensory: No sensory deficit.     Motor: No weakness.     Coordination: Coordination normal.     Gait: Gait normal.     Deep Tendon Reflexes: Reflexes are normal and symmetric. Reflexes normal.  Psychiatric:        Mood and Affect: Mood normal.           Assessment & Plan:   Problem List Items Addressed This Visit       Other   Neck pain on right side    Since a fall that caused her to tilt head to the left  Pain with some tingling over lateral neck and trap area  Reassuring exam  Xr notes some lower cervical spondylosis and C5-6 foraminal narrowing   Would consider course of prednisone with specialty care and PT If needed  Update if not starting to improve in a week or if worsening  Call back and Er precautions noted in detail today        Relevant Orders   DG Cervical Spine Complete (Completed)   Personal history of fall    Pt has fallen more often Notes loss of balance  See a/p for poor balance       Poor balance - Primary    Worse over the past year with several  falls  Dizziness is infrequent  Suspect multi factorial with age, deconditioning , chronic medical problems and multiple sedating medicines as possible causes  Reassuring exam with no neuro changes today   Discussed plan for exercise program Declines PT for balance but may consider later  Discussed multiple sedating medicines  Plans to try holding singulair for a few days to see if she tolerates being without it  Instructed her to discuss psychiatric medicines with her prescriber  Also to hold benadryl and meclizine unless abs necessary  Instructed to stay hydrated and avoid etoh  Discussed fall prevention in /out of home- see AVS and also handout given

## 2023-04-30 NOTE — Patient Instructions (Addendum)
Start slowly with the strength training   Don't take benadryl or meclizine unless abs necessary   You can try holding generic singulair to see if any difference - if allergies/asthma worsen then go back on it   Talk to your psychiatrist about the balance issues  You are on multiple medicines that affect balance and also sedate   Stay well hydrated  Use caution with alcohol   Hold hand rails  Do not multi task when walking if possible   Change position slowly   We may want to consider physical therapy for balance and /or neck pain in the future    Let's get an xray of neck today  We will make a plan based on that   Try gentle heat on neck for 10 minutes at a time

## 2023-04-30 NOTE — Assessment & Plan Note (Signed)
Since a fall that caused her to tilt head to the left  Pain with some tingling over lateral neck and trap area  Reassuring exam  Xr notes some lower cervical spondylosis and C5-6 foraminal narrowing   Would consider course of prednisone with specialty care and PT If needed  Update if not starting to improve in a week or if worsening  Call back and Er precautions noted in detail today

## 2023-04-30 NOTE — Assessment & Plan Note (Signed)
Pt has fallen more often Notes loss of balance  See a/p for poor balance

## 2023-04-30 NOTE — Assessment & Plan Note (Signed)
Worse over the past year with several falls  Dizziness is infrequent  Suspect multi factorial with age, deconditioning , chronic medical problems and multiple sedating medicines as possible causes  Reassuring exam with no neuro changes today   Discussed plan for exercise program Declines PT for balance but may consider later  Discussed multiple sedating medicines  Plans to try holding singulair for a few days to see if she tolerates being without it  Instructed her to discuss psychiatric medicines with her prescriber  Also to hold benadryl and meclizine unless abs necessary  Instructed to stay hydrated and avoid etoh  Discussed fall prevention in /out of home- see AVS and also handout given

## 2023-05-01 MED ORDER — PREDNISONE 10 MG PO TABS: ORAL_TABLET | ORAL | 0 refills | Status: DC

## 2023-05-01 NOTE — Addendum Note (Signed)
Addended by: [name] A on: 05/01/2023 04:44 PM   Modules accepted: Orders

## 2023-06-21 ENCOUNTER — Encounter: Payer: Self-pay | Admitting: Family Medicine

## 2023-06-21 DIAGNOSIS — M542 Cervicalgia: Secondary | ICD-10-CM

## 2023-06-23 ENCOUNTER — Encounter: Payer: Self-pay | Admitting: *Deleted

## 2023-11-29 ENCOUNTER — Other Ambulatory Visit: Payer: Self-pay | Admitting: Family Medicine

## 2023-11-29 DIAGNOSIS — J4520 Mild intermittent asthma, uncomplicated: Secondary | ICD-10-CM

## 2023-12-04 ENCOUNTER — Ambulatory Visit: Admitting: Family Medicine

## 2023-12-04 ENCOUNTER — Ambulatory Visit: Payer: Self-pay | Admitting: Family Medicine

## 2023-12-04 ENCOUNTER — Encounter: Payer: Self-pay | Admitting: Family Medicine

## 2023-12-04 VITALS — BP 141/88 | HR 75 | Temp 98.2°F | Ht 67.25 in | Wt 209.5 lb

## 2023-12-04 DIAGNOSIS — Z1211 Encounter for screening for malignant neoplasm of colon: Secondary | ICD-10-CM

## 2023-12-04 DIAGNOSIS — F419 Anxiety disorder, unspecified: Secondary | ICD-10-CM

## 2023-12-04 DIAGNOSIS — F3162 Bipolar disorder, current episode mixed, moderate: Secondary | ICD-10-CM

## 2023-12-04 DIAGNOSIS — S30861A Insect bite (nonvenomous) of abdominal wall, initial encounter: Secondary | ICD-10-CM

## 2023-12-04 DIAGNOSIS — R1084 Generalized abdominal pain: Secondary | ICD-10-CM | POA: Insufficient documentation

## 2023-12-04 DIAGNOSIS — W57XXXA Bitten or stung by nonvenomous insect and other nonvenomous arthropods, initial encounter: Secondary | ICD-10-CM

## 2023-12-04 DIAGNOSIS — K219 Gastro-esophageal reflux disease without esophagitis: Secondary | ICD-10-CM

## 2023-12-04 DIAGNOSIS — R197 Diarrhea, unspecified: Secondary | ICD-10-CM

## 2023-12-04 DIAGNOSIS — F32A Depression, unspecified: Secondary | ICD-10-CM

## 2023-12-04 DIAGNOSIS — K760 Fatty (change of) liver, not elsewhere classified: Secondary | ICD-10-CM

## 2023-12-04 DIAGNOSIS — K580 Irritable bowel syndrome with diarrhea: Secondary | ICD-10-CM

## 2023-12-04 DIAGNOSIS — Z79899 Other long term (current) drug therapy: Secondary | ICD-10-CM

## 2023-12-04 LAB — BASIC METABOLIC PANEL WITH GFR
BUN: 11 mg/dL (ref 6–23)
CO2: 30 meq/L (ref 19–32)
Calcium: 9.8 mg/dL (ref 8.4–10.5)
Chloride: 104 meq/L (ref 96–112)
Creatinine, Ser: 0.86 mg/dL (ref 0.40–1.20)
GFR: 74.19 mL/min (ref >60.00)
Glucose, Bld: 159 mg/dL — ABNORMAL HIGH (ref 70–99)
Potassium: 4.4 meq/L (ref 3.5–5.1)
Sodium: 142 meq/L (ref 135–145)

## 2023-12-04 LAB — HEPATIC FUNCTION PANEL
ALT: 28 U/L (ref 0–35)
AST: 23 U/L (ref 0–37)
Albumin: 4.2 g/dL (ref 3.5–5.2)
Alkaline Phosphatase: 130 U/L — ABNORMAL HIGH (ref 39–117)
Bilirubin, Direct: 0.2 mg/dL (ref 0.0–0.3)
Total Bilirubin: 0.7 mg/dL (ref 0.2–1.2)
Total Protein: 6.2 g/dL (ref 6.0–8.3)

## 2023-12-04 LAB — CBC WITH DIFFERENTIAL/PLATELET
Basophils Absolute: 0 10*3/uL (ref 0.0–0.1)
Basophils Relative: 0.6 % (ref 0.0–3.0)
Eosinophils Absolute: 0.1 10*3/uL (ref 0.0–0.7)
Eosinophils Relative: 2.2 % (ref 0.0–5.0)
HCT: 42 % (ref 36.0–46.0)
Hemoglobin: 14.2 g/dL (ref 12.0–15.0)
Lymphocytes Relative: 35.2 % (ref 12.0–46.0)
Lymphs Abs: 1.9 10*3/uL (ref 0.7–4.0)
MCHC: 33.8 g/dL (ref 30.0–36.0)
MCV: 88.2 fl (ref 78.0–100.0)
Monocytes Absolute: 0.5 10*3/uL (ref 0.1–1.0)
Monocytes Relative: 9.4 % (ref 3.0–12.0)
Neutro Abs: 2.8 10*3/uL (ref 1.4–7.7)
Neutrophils Relative %: 52.6 % (ref 43.0–77.0)
Platelets: 227 10*3/uL (ref 150.0–400.0)
RBC: 4.76 Mil/uL (ref 3.87–5.11)
RDW: 13.2 % (ref 11.5–15.5)
WBC: 5.4 10*3/uL (ref 4.0–10.5)

## 2023-12-04 LAB — TSH: TSH: 5.5 u[IU]/mL (ref 0.35–5.50)

## 2023-12-04 LAB — LIPASE: Lipase: 26 U/L (ref 11.0–59.0)

## 2023-12-04 MED ORDER — ALBUTEROL SULFATE HFA 108 (90 BASE) MCG/ACT IN AERS: INHALATION_SPRAY | RESPIRATORY_TRACT | 0 refills | Status: DC

## 2023-12-04 NOTE — Assessment & Plan Note (Signed)
 Unsure if this is adding to her diarrhea /abd symptoms  Did not get colonoscopy as planned due to caring for father  Much stress as well   Lab today for diarrhea/abd pain  Follow up scheduled Encouraged to avoid foods that flare  Fiber if helpful  Encouraged to keep up fluids

## 2023-12-04 NOTE — Assessment & Plan Note (Signed)
 Small bite (less than 1 cm) no rash Some GI symptoms but no fever Has had many bites recently- labs ordered   Watching for rash/fever or new symptoms

## 2023-12-04 NOTE — Assessment & Plan Note (Signed)
 Continues nexium  40 mg daily  Per pt stable

## 2023-12-04 NOTE — Assessment & Plan Note (Signed)
 Recently worse ? If related to IBS/ it feels different Also abd bloating and discomfort   Reassuring exam Lab and stool tests ordered Follow up planned  Discussed avoiding food triggers   Of note-stress is high and off bipolar meds Of note-did not get colonoscopy that was ordered /is overdue -will discuss at follow up   Call back and Er precautions noted in detail today

## 2023-12-04 NOTE — Assessment & Plan Note (Signed)
 In setting of diarrhea  Lab and stool tests  Follow up planned  Call back and Er precautions noted in detail today

## 2023-12-04 NOTE — Assessment & Plan Note (Signed)
 With dx of bipolar Was seeing Dr Burnis Carver at [facility] psych care No longer going Stopped medications and not interested in re starting   Grief and financial stress noted

## 2023-12-04 NOTE — Assessment & Plan Note (Signed)
 Labs today  Per pt eating about the same  Fibrosis score low risk in past   Encouraged weight loss

## 2023-12-04 NOTE — Progress Notes (Signed)
 Subjective:    Patient ID: Michele [name], female    DOB: [birth year], 58 y.o.   MRN: [id number]  HPI  Wt Readings from Last 3 Encounters:  12/04/23 209 lb 8 oz (95 kg)  04/30/23 201 lb 8 oz (91.4 kg)  02/23/23 202 lb 6 oz (91.8 kg)   32.57 kg/m  Vitals:   12/04/23 0846 12/04/23 0922  BP: (!) 150/94 (!) 141/88  Pulse: 75   Temp: 98.2 F (36.8 C)   SpO2: 98%    Pt presents with GI symptoms  Started a month ago  (does not remember last food before this started)   Diarrhea Stool is watery to paste consistency (not solid)  Explosive at times  Hard to control  2-3 times per day or occational more  No blood  Lot of gas Little cramping   Abd pain -mainly right before bm  Lot of gurgling to start -this has improved   Nausea but no vomiting Comes and goes   Eating bland now   No fever but her temp was lower than usual on head scan  Few cold sweats     Has had a lot of tick bites recently  No rash or fever or bullseye shape     History of GERD-nexium  40 mg daily  History of IBS, colonoscopy 2012     (was referred for one but did not get it) - took care of father and he passed away and recovering from that , lost her job   Severely stressed- financially   Weight is up  She tries to eat healthy  Lot of salmon when eating out   Was seeing psychiatrist for bipolar diagnosis  Then stopped Feels better off of medicines      12/04/2023    8:53 AM 04/30/2023    3:21 PM 02/23/2023    9:55 AM 01/31/2022   11:09 AM 11/11/2020    9:13 AM  Depression screen PHQ 2/9  Decreased Interest 1 2 3  0 0  Down, Depressed, Hopeless 1 2 2  0 0  PHQ - 2 Score 2 4 5  0 0  Altered sleeping 2 3 2     Tired, decreased energy 2 3 3     Change in appetite 2 2 3     Feeling bad or failure about yourself  1 3 2     Trouble concentrating 1 3 3     Moving slowly or fidgety/restless 0 0 2    Suicidal thoughts 0 0 0    PHQ-9 Score 10 18 20     Difficult doing work/chores Somewhat difficult Very  difficult Extremely dIfficult        12/04/2023    8:53 AM 04/30/2023    3:21 PM 02/23/2023    9:55 AM 08/28/2018    6:51 PM  GAD 7 : Generalized Anxiety Score  Nervous, Anxious, on Edge 2 2 2    Control/stop worrying 2 3 3    Worry too much - different things 2 3 2    Trouble relaxing 2 3 2    Restless 1 2 2    Easily annoyed or irritable 2 2 2    Afraid - awful might happen 2 3 2    Total GAD 7 Score 13 18 15    Anxiety Difficulty Somewhat difficult Very difficult Extremely difficult      Information is confidential and restricted. Go to Review Flowsheets to unlock data.       History of fatty liver Lab Results  Component Value Date   ALT 37 (  H) 02/20/2023   AST 27 02/20/2023   ALKPHOS 119 (H) 02/20/2023   BILITOT 0.8 02/20/2023    Fibrosis 4 Score = 1.12 (Low risk)        Interpretation for patients with NAFLD          <1.30       -  F0-F1 (Low risk)          1.30-2.67 -  Indeterminate           >2.67      -  F3-F4 (High risk)     Validated for ages 91-65      Score is based on outdated labs. ALT, AST, and platelets should all be measured within the last 6 months for an accurate FIB-4 Score    Patient Active Problem List   Diagnosis Date Noted   Diffuse abdominal pain 12/04/2023   Neck pain on right side 04/30/2023   Poor balance 04/30/2023   Fatty liver 02/23/2023   Obesity (BMI 30-39.9) 02/23/2023   Blurry vision 02/23/2023   Personal history of fall 02/23/2023   Colon cancer screening 01/31/2022   Encounter for screening mammogram for breast cancer 01/31/2022   Vitamin B12 deficiency 12/22/2020   Elevated TSH 12/22/2020   Current use of proton pump inhibitor 12/14/2020   Ingrown toenail of right foot 12/02/2020   Fatigue 12/02/2020   Onychomycosis 12/02/2020   Meralgia paresthetica 10/15/2019   Bipolar 1 disorder, mixed, moderate (HCC) 08/25/2019   Family history of DVT 08/25/2019   BPV (benign positional vertigo) 11/04/2018   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   Allergic rhinitis 01/10/2017   Tick bite of abdomen 11/15/2016   Adjustment reaction with anxious mood 08/18/2015   Arm paresthesia, right 07/14/2015   Diarrhea 10/13/2014   Rectocele 10/01/2013   Mixed incontinence 04/23/2013   Encounter for routine gynecological examination 04/01/2012   Prediabetes 04/01/2012   Routine general medical examination at a health care facility 03/11/2012   GERD 08/26/2010   History of anemia 07/19/2010   FREQUENCY, URINARY 07/06/2010   MICROSCOPIC HEMATURIA 06/09/2010   History of colonic polyps 11/15/2007   IBS 10/08/2007   ENDOMETRIOSIS 10/08/2007   Mixed hyperlipidemia 10/30/2006   Anxiety and depression 10/30/2006   HEMORRHOIDS, INTERNAL 10/30/2006   Asthma 10/30/2006   MIGRAINES, HX OF 10/30/2006   Past Medical History:  Diagnosis Date   Abnormal Pap smear of cervix    cryotx   Anxiety    Arthritis    Asthma    Depression    Endometriosis    Fatty liver    Gastric polyp    GERD (gastroesophageal reflux disease)    Hemorrhoids    Hiatal hernia    HLD (hyperlipidemia)    Hyperplastic colon polyp    IBS (irritable bowel syndrome)    Iron deficiency anemia 1/12   Pneumonia    Rectocele    Rotator cuff tear, left 2009   Syncope    Tachycardia    s/p negative cardiac work up with echo   Uterine fibroid [id number]   Past Surgical History:  Procedure Laterality Date   CARPAL TUNNEL RELEASE Bilateral    COLONOSCOPY  1999   small polyps, hemms   ESOPHAGOGASTRODUODENOSCOPY  2/12   gastric polyp and HH   HYSTEROSCOPY     LAPAROSCOPY  2006   endometriosis   ROTATOR CUFF REPAIR Left    TRIGGER FINGER RELEASE     Social History   Tobacco Use   Smoking status: Former  Current packs/day: 0.00    Average packs/day: 0.3 packs/day for 5.0 years (1.3 ttl pk-yrs)    Types: Cigarettes    Start date: 07/04/1987    Quit date: 07/03/1992    Years since quitting: 31.4    Passive exposure: Past   Smokeless tobacco: Never   Vaping Use   Vaping status: Never Used  Substance Use Topics   Alcohol use: Yes    Alcohol/week: 1.0 standard drink of alcohol    Types: 1 Glasses of wine per week   Drug use: No   Family History  Problem Relation Age of Onset   Hypertension Father    Diabetes Father    Stroke Father    Migraines Father    Barrett's esophagus Father    Anemia Father        iron def   Heart disease Father        pacemaker, a fib, CHF   Bipolar disorder Father    Dementia Mother    Allergic rhinitis Mother    Diabetes Sister    Hyperlipidemia Sister    Deep vein thrombosis Sister    Anxiety disorder Daughter    Bipolar disorder Daughter    ADD / ADHD Daughter    Neuropathy Daughter    Syncope episode Daughter    Diabetes Paternal Grandfather    COPD Paternal Grandmother    Breast cancer Paternal Grandmother    Angioedema Neg Hx    Asthma Neg Hx    Atopy Neg Hx    Eczema Neg Hx    Immunodeficiency Neg Hx    Urticaria Neg Hx    Allergies  Allergen Reactions   Codeine  Itching   Hydrocodone-Acetaminophen  Itching   Lactose Intolerance (Gi)    Other    Povidone-Iodine Rash        Propoxyphene N-Acetaminophen  Itching        Tolmetin Nausea And Vomiting   Current Outpatient Medications on File Prior to Visit  Medication Sig Dispense Refill   aspirin EC 81 MG tablet Take 81 mg by mouth daily.     atorvastatin  (LIPITOR) 80 MG tablet TAKE 1 TABLET (80 MG TOTAL) BY MOUTH DAILY. SCHEDULE OFFICE VISIT FOR FUTURE REFILLS. THIRD ATTEMPT. 90 tablet 3   CALCIUM  PO Take by mouth.     cholecalciferol (VITAMIN D3) 25 MCG (1000 UNIT) tablet Take 1,000 Units by mouth daily.     diphenhydrAMINE (BENADRYL) 25 MG tablet Take 75 mg by mouth at bedtime as needed for sleep.     esomeprazole  (NEXIUM ) 40 MG capsule TAKE 1 CAPSULE BY MOUTH EVERY DAY IN THE MORNING 90 capsule 1   montelukast  (SINGULAIR ) 10 MG tablet TAKE 1 TABLET BY MOUTH EVERYDAY AT BEDTIME 90 tablet 1   naproxen sodium (ALEVE) 220 MG  tablet Take 440 mg by mouth daily as needed (pain).     Omega-3 Fatty Acids (FISH OIL OMEGA-3 PO) Take by mouth.     traZODone (DESYREL) 50 MG tablet Take 25 mg by mouth at bedtime.     vitamin C (ASCORBIC ACID) 500 MG tablet Take 500 mg by mouth daily.     vitamin E  (VITAMIN E ) 400 UNIT capsule Take 2 capsules (800 Units total) by mouth daily. 60 capsule 5   zinc gluconate 50 MG tablet Take 50 mg by mouth daily.     fluticasone  (FLONASE ) 50 MCG/ACT nasal spray Place 1 spray into both nostrils daily for 14 days. 16 g 0   No current facility-administered medications on  file prior to visit.    Review of Systems  Constitutional:  Positive for fatigue. Negative for activity change, appetite change, fever and unexpected weight change.  HENT:  Negative for congestion, ear pain, rhinorrhea, sinus pressure and sore throat.   Eyes:  Negative for pain, redness and visual disturbance.  Respiratory:  Negative for cough, shortness of breath and wheezing.   Cardiovascular:  Negative for chest pain and palpitations.  Gastrointestinal:  Positive for abdominal distention, abdominal pain and diarrhea. Negative for blood in stool, constipation, nausea and vomiting.  Endocrine: Negative for polydipsia and polyuria.  Genitourinary:  Negative for dysuria, frequency and urgency.  Musculoskeletal:  Negative for arthralgias, back pain and myalgias.  Skin:  Negative for pallor and rash.       Tick bites   Allergic/Immunologic: Negative for environmental allergies.  Neurological:  Negative for dizziness, syncope and headaches.  Hematological:  Negative for adenopathy. Does not bruise/bleed easily.  Psychiatric/Behavioral:  Positive for dysphoric mood and sleep disturbance. Negative for decreased concentration and suicidal ideas. The patient is nervous/anxious.        Objective:   Physical Exam Constitutional:      General: She is not in acute distress.    Appearance: She is well-developed. She is obese. She  is not ill-appearing or diaphoretic.  HENT:     Head: Normocephalic and atraumatic.  Eyes:     Conjunctiva/sclera: Conjunctivae normal.     Pupils: Pupils are equal, round, and reactive to light.  Neck:     Thyroid : No thyromegaly.     Vascular: No carotid bruit or JVD.  Cardiovascular:     Rate and Rhythm: Normal rate and regular rhythm.     Heart sounds: Normal heart sounds.     No gallop.  Pulmonary:     Effort: Pulmonary effort is normal. No respiratory distress.     Breath sounds: Normal breath sounds. No wheezing or rales.  Abdominal:     General: Abdomen is protuberant. Bowel sounds are normal. There is no distension or abdominal bruit.     Palpations: Abdomen is soft. There is no fluid wave, hepatomegaly, splenomegaly, mass or pulsatile mass.     Tenderness: There is generalized abdominal tenderness. There is no right CVA tenderness, left CVA tenderness or guarding. Negative signs include Murphy's sign and McBurney's sign.     Hernia: No hernia is present.  Musculoskeletal:     Cervical back: Normal range of motion and neck supple.     Right lower leg: No edema.     Left lower leg: No edema.  Lymphadenopathy:     Cervical: No cervical adenopathy.  Skin:    General: Skin is warm and dry.     Coloration: Skin is not pale.     Findings: No rash.  Neurological:     Mental Status: She is alert.     Coordination: Coordination normal.     Deep Tendon Reflexes: Reflexes are normal and symmetric. Reflexes normal.  Psychiatric:        Attention and Perception: Attention normal.     Comments: Candidly discusses symptoms and stressors   Mildly anxious            Assessment & Plan:   Problem List Items Addressed This Visit       Digestive   IBS   Unsure if this is adding to her diarrhea /abd symptoms  Did not get colonoscopy as planned due to caring for father  Much stress as well  Lab today for diarrhea/abd pain  Follow up scheduled Encouraged to avoid foods  that flare  Fiber if helpful  Encouraged to keep up fluids       GERD   Continues nexium  40 mg daily  Per pt stable       Fatty liver   Labs today  Per pt eating about the same  Fibrosis score low risk in past   Encouraged weight loss       Relevant Orders   Hepatic function panel     Musculoskeletal and Integument   Tick bite of abdomen   Small bite (less than 1 cm) no rash Some GI symptoms but no fever Has had many bites recently- labs ordered   Watching for rash/fever or new symptoms       Relevant Orders   B. burgdorfi antibodies by WB   Rocky mtn spotted fvr abs pnl(IgG+IgM)     Other   Diffuse abdominal pain   In setting of diarrhea  Lab and stool tests  Follow up planned  Call back and Er precautions noted in detail today        Relevant Orders   CBC with Differential/Platelet   Basic metabolic panel with GFR   Hepatic function panel   Lipase   Gastrointestinal Pathogen Pnl RT, PCR   C. difficile GDH and Toxin A/B   Diarrhea - Primary   Recently worse ? If related to IBS/ it feels different Also abd bloating and discomfort   Reassuring exam Lab and stool tests ordered Follow up planned  Discussed avoiding food triggers   Of note-stress is high and off bipolar meds Of note-did not get colonoscopy that was ordered /is overdue -will discuss at follow up   Call back and Er precautions noted in detail today        Relevant Orders   CBC with Differential/Platelet   Basic metabolic panel with GFR   Hepatic function panel   TSH   Gastrointestinal Pathogen Pnl RT, PCR   C. difficile GDH and Toxin A/B   Bipolar 1 disorder, mixed, moderate (HCC)   With dep/anx Was seeing Dr [facility] No longer going Stopped medications and not interested in re starting  (made her foggy)   Grief and financial stress noted       Anxiety and depression   With dx of bipolar Was seeing Dr Burnis Carver at [facility] care No longer  going Stopped medications and not interested in re starting   Grief and financial stress noted

## 2023-12-04 NOTE — Patient Instructions (Addendum)
 Keep eating bland  Keep fluids up If symptoms worsen let us  know  If severe go to the ER   Lab and stool tests   Follow up in 2 weeks for re check and also blood pressure

## 2023-12-04 NOTE — Assessment & Plan Note (Signed)
 With dep/anx Was seeing Dr Burnis Carver at [facility] psych care No longer going Stopped medications and not interested in re starting  (made her foggy)   Grief and financial stress noted

## 2023-12-05 LAB — C. DIFFICILE GDH AND TOXIN A/B
GDH ANTIGEN: NOT DETECTED
MICRO NUMBER:: 16532572
SPECIMEN QUALITY:: ADEQUATE
TOXIN A AND B: NOT DETECTED

## 2023-12-07 LAB — GASTROINTESTINAL PATHOGEN PNL
CampyloBacter Group: NOT DETECTED
Norovirus GI/GII: NOT DETECTED
Rotavirus A: NOT DETECTED
Salmonella species: NOT DETECTED
Shiga Toxin 1: NOT DETECTED
Shiga Toxin 2: NOT DETECTED
Shigella Species: NOT DETECTED
Vibrio Group: NOT DETECTED
Yersinia enterocolitica: NOT DETECTED

## 2023-12-08 LAB — B. BURGDORFI ANTIBODIES BY WB
B burgdorferi IgG Abs (IB): NEGATIVE
B burgdorferi IgM Abs (IB): NEGATIVE
Lyme Disease 18 kD IgG: NONREACTIVE
Lyme Disease 23 kD IgG: REACTIVE — AB
Lyme Disease 23 kD IgM: NONREACTIVE
Lyme Disease 28 kD IgG: NONREACTIVE
Lyme Disease 30 kD IgG: NONREACTIVE
Lyme Disease 39 kD IgG: REACTIVE — AB
Lyme Disease 39 kD IgM: NONREACTIVE
Lyme Disease 41 kD IgG: NONREACTIVE
Lyme Disease 41 kD IgM: NONREACTIVE
Lyme Disease 45 kD IgG: NONREACTIVE
Lyme Disease 58 kD IgG: REACTIVE — AB
Lyme Disease 66 kD IgG: NONREACTIVE
Lyme Disease 93 kD IgG: REACTIVE — AB

## 2023-12-08 LAB — ROCKY MTN SPOTTED FVR ABS PNL(IGG+IGM)
RMSF IgG: DETECTED — AB
RMSF IgM: NOT DETECTED

## 2023-12-08 LAB — REFLEX RMSF IGG TITER: RMSF IgG Titer: 1:64 {titer} — ABNORMAL HIGH

## 2023-12-10 ENCOUNTER — Other Ambulatory Visit: Payer: Self-pay | Admitting: Family Medicine

## 2023-12-10 DIAGNOSIS — Z1231 Encounter for screening mammogram for malignant neoplasm of breast: Secondary | ICD-10-CM

## 2023-12-18 ENCOUNTER — Ambulatory Visit: Admitting: Family Medicine

## 2023-12-18 ENCOUNTER — Encounter: Payer: Self-pay | Admitting: Family Medicine

## 2023-12-18 VITALS — BP 128/84 | HR 83 | Temp 98.6°F | Wt 209.0 lb

## 2023-12-18 DIAGNOSIS — R1084 Generalized abdominal pain: Secondary | ICD-10-CM

## 2023-12-18 DIAGNOSIS — R197 Diarrhea, unspecified: Secondary | ICD-10-CM

## 2023-12-18 DIAGNOSIS — K580 Irritable bowel syndrome with diarrhea: Secondary | ICD-10-CM | POA: Diagnosis not present

## 2023-12-18 DIAGNOSIS — J4520 Mild intermittent asthma, uncomplicated: Secondary | ICD-10-CM

## 2023-12-18 MED ORDER — ALBUTEROL SULFATE HFA 108 (90 BASE) MCG/ACT IN AERS: INHALATION_SPRAY | RESPIRATORY_TRACT | 11 refills | Status: AC

## 2023-12-18 NOTE — Patient Instructions (Addendum)
 Follow up with GI as planned  Keep a record of foods you tolerate and not  Continue probiotic if helpful Continue the fiber   Blood pressure is good   Lungs sound good Use the inhaler as needed   Take care of yourself

## 2023-12-18 NOTE — Assessment & Plan Note (Signed)
 Improved but not gone  Reassuring labs including stool culture /GI profile and also tick labs   Follow up planned with GI next mo

## 2023-12-18 NOTE — Progress Notes (Signed)
 Subjective:    Patient ID: Michele [name], female    DOB: 26-Aug-1965, 58 y.o.   MRN: [id number]  HPI  Wt Readings from Last 3 Encounters:  12/18/23 209 lb (94.8 kg)  12/04/23 209 lb 8 oz (95 kg)  04/30/23 201 lb 8 oz (91.4 kg)   32.49 kg/m  Vitals:   12/18/23 0900  BP: 128/84  Pulse: 83  Temp: 98.6 F (37 C)  SpO2: 97%   Pt presents for  Follow up of GI problems  Blood pressure check (just wanted to know)  Breathing problems   Diarrhea History of IBS- metamucil helps bulk up stool a bit /helping some  Still a lot of pressure over lower abdomen and still is still loose  Makes note that she has a recocele and a cystocele (makes it hard to evacuate bowels)   Has to watch out for raw veggies /especially tomato  Is taking probiotic acidophilus    Labs were reassuring  Has appointment with GI next month   Breathing change She hears a wheezing sound /unsure if wheezing  No cheat pressure or pain  Humidity makes it worse / air quality also   Is trying to get out and walk more -daily to twice daily walk / doing better  Does use her inhaler - especially when walking outside (just sent that in)  No orthopnea / no PND     Did tick labs last time for tick bite - possible had rmsf in past   Office Visit on 12/04/2023  Component Date Value Ref Range Status   WBC 12/04/2023 5.4  4.0 - 10.5 K/uL Final   RBC 12/04/2023 4.76  3.87 - 5.11 Mil/uL Final   Hemoglobin 12/04/2023 14.2  12.0 - 15.0 g/dL Final   HCT 40/98/1191 42.0  36.0 - 46.0 % Final   MCV 12/04/2023 88.2  78.0 - 100.0 fl Final   MCHC 12/04/2023 33.8  30.0 - 36.0 g/dL Final   RDW 47/82/9562 13.2  11.5 - 15.5 % Final   Platelets 12/04/2023 227.0  150.0 - 400.0 K/uL Final   Neutrophils Relative % 12/04/2023 52.6  43.0 - 77.0 % Final   Lymphocytes Relative 12/04/2023 35.2  12.0 - 46.0 % Final   Monocytes Relative 12/04/2023 9.4  3.0 - 12.0 % Final   Eosinophils Relative 12/04/2023 2.2  0.0 - 5.0 % Final    Basophils Relative 12/04/2023 0.6  0.0 - 3.0 % Final   Neutro Abs 12/04/2023 2.8  1.4 - 7.7 K/uL Final   Lymphs Abs 12/04/2023 1.9  0.7 - 4.0 K/uL Final   Monocytes Absolute 12/04/2023 0.5  0.1 - 1.0 K/uL Final   Eosinophils Absolute 12/04/2023 0.1  0.0 - 0.7 K/uL Final   Basophils Absolute 12/04/2023 0.0  0.0 - 0.1 K/uL Final   Sodium 12/04/2023 142  135 - 145 mEq/L Final   Potassium 12/04/2023 4.4  3.5 - 5.1 mEq/L Final   Chloride 12/04/2023 104  96 - 112 mEq/L Final   CO2 12/04/2023 30  19 - 32 mEq/L Final   Glucose, Bld 12/04/2023 159 (H)  70 - 99 mg/dL Final   BUN 13/01/6577 11  6 - 23 mg/dL Final   Creatinine, Ser 12/04/2023 0.86  0.40 - 1.20 mg/dL Final   GFR 46/96/2952 74.19  >60.00 mL/min Final   Calculated using the CKD-EPI Creatinine Equation (2021)   Calcium  12/04/2023 9.8  8.4 - 10.5 mg/dL Final   Total Bilirubin 12/04/2023 0.7  0.2 - 1.2 mg/dL  Final   Bilirubin, Direct 12/04/2023 0.2  0.0 - 0.3 mg/dL Final   Alkaline Phosphatase 12/04/2023 130 (H)  39 - 117 U/L Final   AST 12/04/2023 23  0 - 37 U/L Final   ALT 12/04/2023 28  0 - 35 U/L Final   Total Protein 12/04/2023 6.2  6.0 - 8.3 g/dL Final   Albumin 27/25/3664 4.2  3.5 - 5.2 g/dL Final   Lipase 40/34/7425 26.0  11.0 - 59.0 U/L Final   TSH 12/04/2023 5.50  0.35 - 5.50 uIU/mL Final   B burgdorferi IgG Abs (IB) 12/04/2023 NEGATIVE  NEGATIVE Final   Lyme Disease 18 kD IgG 12/04/2023 NON-REACTIVE   Final   Lyme Disease 23 kD IgG 12/04/2023 REACTIVE (A)   Final   Lyme Disease 28 kD IgG 12/04/2023 NON-REACTIVE   Final   Lyme Disease 30 kD IgG 12/04/2023 NON-REACTIVE   Final   Lyme Disease 39 kD IgG 12/04/2023 REACTIVE (A)   Final   Lyme Disease 41 kD IgG 12/04/2023 NON-REACTIVE   Final   Lyme Disease 45 kD IgG 12/04/2023 NON-REACTIVE   Final   Lyme Disease 58 kD IgG 12/04/2023 REACTIVE (A)   Final   Lyme Disease 66 kD IgG 12/04/2023 NON-REACTIVE   Final   Lyme Disease 93 kD IgG 12/04/2023 REACTIVE (A)   Final   B  burgdorferi IgM Abs (IB) 12/04/2023 NEGATIVE  NEGATIVE Final   Lyme Disease 23 kD IgM 12/04/2023 NON-REACTIVE   Final   Lyme Disease 39 kD IgM 12/04/2023 NON-REACTIVE   Final   Lyme Disease 41 kD IgM 12/04/2023 NON-REACTIVE   Final   Comment: Lyme immunoblot testing should only be performed on samples from patients who have had a Positive or Equivocal result in a screening assay. . As per CDC criteria, a Lyme disease IgG Immunoblot must show reactivity to at least 5 of 10 specific borrelial proteins to be considered positive; similarly, a  positive Lyme disease IgM immunoblot requires reactivity to 2 of 3 specific borrelial proteins. Although considered negative, IgG reactivity to fewer specific borrelial proteins or IgM reactivity to only 1 protein may indicate recent B. burgdorferi infection and warrant testing of a later sample. A positive IgM but negative IgG result obtained more than a month after onset of symptoms likely represents a false- positive IgM result rather than acute Lyme disease. In rare instances, Lyme disease immunoblot reactivity may represent antibodies induced by exposure to other spirochetes.  Aaron Aas    RMSF IgG 12/04/2023 Detected (A)  Not Detected Final   RMSF IgM 12/04/2023 Not Detected  Not Detected Final   MICRO NUMBER: 12/04/2023 [id number]   Final   SPECIMEN QUALITY: 12/04/2023 Adequate   Final   Source 12/04/2023 STOOL   Final   STATUS: 12/04/2023 FINAL   Final   GDH ANTIGEN 12/04/2023 Not Detected   Final   TOXIN A AND B 12/04/2023 Not Detected   Final   COMMENT 12/04/2023 No toxigenic C. difficile detected For additional information, please refer to http://education.[facility].com/faq/FAQ136 (This link is being provided for informational/educational purposes only.)   Final   CampyloBacter Group 12/04/2023 NOT DETECTED  NOT DETECTED Final   Salmonella species 12/04/2023 NOT DETECTED  NOT DETECTED Final   Shigella Species 12/04/2023 NOT DETECTED  NOT  DETECTED Final   Vibrio Group 12/04/2023 NOT DETECTED  NOT DETECTED Final   Yersinia enterocolitica 12/04/2023 NOT DETECTED  NOT DETECTED Final   Shiga Toxin 1 12/04/2023 NOT DETECTED  NOT DETECTED Final  Shiga Toxin 2 12/04/2023 NOT DETECTED  NOT DETECTED Final   Norovirus GI/GII 12/04/2023 NOT DETECTED  NOT DETECTED Final   Rotavirus A 12/04/2023 NOT DETECTED  NOT DETECTED Final   Comment: Organisms included in the Campylobacter group include C. coli, C. jejuni, and C. lari. Organisms included in the Shigella species include S. dysenteriae, S. boydii, S. sonnei, and S. flexneri. Organisms included in the Vibrio group include V. cholerae and V. parahaemolyticus.    RMSF IgG Titer 12/04/2023 1:64 (H)  <1:64 Titer Final   Comment: . The Spotted Group of rickettsial agents includes R. rickettsii ([place] Spotted Fever), R. akari (Rickettsialpox), and R. conorii ([place] Fever). Antibodies against rickettsial agents may be detectable within 7 to 10 days after illness onset and may remain present for years. Single IgG titers of 1:64 to 1:256 suggest infection at an undetermined time and may indicate either past infection or early response to a recent infection. IgG titers of 1:256 and greater are presumptive evidence of recent or current infection or may indicate remote infection. Recent infection should be confirmed by demonstrating either IgG seroconversion or a four-fold or greater increase in IgG titer between acute and convalescent sera collected 2 to 4 weeks apart and tested in parallel. A single elevated IgG titer is not sufficient to confirm acute RMSF. Aaron Aas Antibodies recognizing Ehrlichia chaffeensis may cross-react in this assay or this may represent past or coinfection. .        Patient Active Problem List   Diagnosis Date Noted   Diffuse abdominal pain 12/04/2023   Neck pain on right side 04/30/2023   Poor balance 04/30/2023   Fatty liver 02/23/2023    Obesity (BMI 30-39.9) 02/23/2023   Blurry vision 02/23/2023   Personal history of fall 02/23/2023   Colon cancer screening 01/31/2022   Encounter for screening mammogram for breast cancer 01/31/2022   Vitamin B12 deficiency 12/22/2020   Elevated TSH 12/22/2020   Current use of proton pump inhibitor 12/14/2020   Ingrown toenail of right foot 12/02/2020   Fatigue 12/02/2020   Onychomycosis 12/02/2020   Meralgia paresthetica 10/15/2019   Bipolar 1 disorder, mixed, moderate (HCC) 08/25/2019   Family history of DVT 08/25/2019   BPV (benign positional vertigo) 11/04/2018   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   Allergic rhinitis 01/10/2017   Tick bite of abdomen 11/15/2016   Adjustment reaction with anxious mood 08/18/2015   Arm paresthesia, right 07/14/2015   Diarrhea 10/13/2014   Rectocele 10/01/2013   Mixed incontinence 04/23/2013   Encounter for routine gynecological examination 04/01/2012   Prediabetes 04/01/2012   Routine general medical examination at a health care facility 03/11/2012   GERD 08/26/2010   History of anemia 07/19/2010   FREQUENCY, URINARY 07/06/2010   MICROSCOPIC HEMATURIA 06/09/2010   History of colonic polyps 11/15/2007   IBS 10/08/2007   ENDOMETRIOSIS 10/08/2007   Mixed hyperlipidemia 10/30/2006   Anxiety and depression 10/30/2006   HEMORRHOIDS, INTERNAL 10/30/2006   Asthma 10/30/2006   MIGRAINES, HX OF 10/30/2006   Past Medical History:  Diagnosis Date   Abnormal Pap smear of cervix    cryotx   Anxiety    Arthritis    Asthma    Depression    Endometriosis    Fatty liver    Gastric polyp    GERD (gastroesophageal reflux disease)    Hemorrhoids    Hiatal hernia    HLD (hyperlipidemia)    Hyperplastic colon polyp    IBS (irritable bowel syndrome)    Iron  deficiency anemia 1/12   Pneumonia    Rectocele    Rotator cuff tear, left 2009   Syncope    Tachycardia    s/p negative cardiac work up with echo   Uterine fibroid [id number]    Past Surgical History:  Procedure Laterality Date   CARPAL TUNNEL RELEASE Bilateral    COLONOSCOPY  1999   small polyps, hemms   ESOPHAGOGASTRODUODENOSCOPY  2/12   gastric polyp and HH   HYSTEROSCOPY     LAPAROSCOPY  2006   endometriosis   ROTATOR CUFF REPAIR Left    TRIGGER FINGER RELEASE     Social History   Tobacco Use   Smoking status: Former    Current packs/day: 0.00    Average packs/day: 0.3 packs/day for 5.0 years (1.3 ttl pk-yrs)    Types: Cigarettes    Start date: 07/04/1987    Quit date: 07/03/1992    Years since quitting: 31.4    Passive exposure: Past   Smokeless tobacco: Never  Vaping Use   Vaping status: Never Used  Substance Use Topics   Alcohol use: Yes    Alcohol/week: 1.0 standard drink of alcohol    Types: 1 Glasses of wine per week   Drug use: No   Family History  Problem Relation Age of Onset   Hypertension Father    Diabetes Father    Stroke Father    Migraines Father    Barrett's esophagus Father    Anemia Father        iron def   Heart disease Father        pacemaker, a fib, CHF   Bipolar disorder Father    Dementia Mother    Allergic rhinitis Mother    Diabetes Sister    Hyperlipidemia Sister    Deep vein thrombosis Sister    Anxiety disorder Daughter    Bipolar disorder Daughter    ADD / ADHD Daughter    Neuropathy Daughter    Syncope episode Daughter    Diabetes Paternal Grandfather    COPD Paternal Grandmother    Breast cancer Paternal Grandmother    Angioedema Neg Hx    Asthma Neg Hx    Atopy Neg Hx    Eczema Neg Hx    Immunodeficiency Neg Hx    Urticaria Neg Hx    Allergies  Allergen Reactions   Codeine  Itching   Hydrocodone-Acetaminophen  Itching   Lactose Intolerance (Gi)    Other    Povidone-Iodine Rash        Propoxyphene N-Acetaminophen  Itching        Tolmetin Nausea And Vomiting   Current Outpatient Medications on File Prior to Visit  Medication Sig Dispense Refill   aspirin EC 81 MG tablet Take 81 mg  by mouth daily.     atorvastatin  (LIPITOR) 80 MG tablet TAKE 1 TABLET (80 MG TOTAL) BY MOUTH DAILY. SCHEDULE OFFICE VISIT FOR FUTURE REFILLS. THIRD ATTEMPT. 90 tablet 3   CALCIUM  PO Take by mouth.     cholecalciferol (VITAMIN D3) 25 MCG (1000 UNIT) tablet Take 1,000 Units by mouth daily.     diphenhydrAMINE (BENADRYL) 25 MG tablet Take 75 mg by mouth at bedtime as needed for sleep.     esomeprazole  (NEXIUM ) 40 MG capsule TAKE 1 CAPSULE BY MOUTH EVERY DAY IN THE MORNING 90 capsule 1   fluticasone  (FLONASE ) 50 MCG/ACT nasal spray Place 1 spray into both nostrils daily for 14 days. 16 g 0   montelukast  (SINGULAIR ) 10 MG tablet  TAKE 1 TABLET BY MOUTH EVERYDAY AT BEDTIME 90 tablet 1   naproxen sodium (ALEVE) 220 MG tablet Take 440 mg by mouth daily as needed (pain).     Omega-3 Fatty Acids (FISH OIL OMEGA-3 PO) Take by mouth.     traZODone (DESYREL) 50 MG tablet Take 25 mg by mouth at bedtime.     vitamin C (ASCORBIC ACID) 500 MG tablet Take 500 mg by mouth daily.     vitamin E  (VITAMIN E ) 400 UNIT capsule Take 2 capsules (800 Units total) by mouth daily. 60 capsule 5   zinc gluconate 50 MG tablet Take 50 mg by mouth daily.     No current facility-administered medications on file prior to visit.    Review of Systems  Constitutional:  Negative for activity change, appetite change, fatigue, fever and unexpected weight change.  HENT:  Negative for congestion, ear pain, rhinorrhea, sinus pressure and sore throat.   Eyes:  Negative for pain, redness and visual disturbance.  Respiratory:  Positive for wheezing. Negative for cough and shortness of breath.        Occational wheezing   Cardiovascular:  Negative for chest pain and palpitations.  Gastrointestinal:  Positive for abdominal distention, abdominal pain and diarrhea. Negative for blood in stool, constipation, nausea, rectal pain and vomiting.  Endocrine: Negative for polydipsia and polyuria.  Genitourinary:  Negative for dysuria, frequency and  urgency.  Musculoskeletal:  Negative for arthralgias, back pain and myalgias.  Skin:  Negative for pallor and rash.  Allergic/Immunologic: Negative for environmental allergies.  Neurological:  Negative for dizziness, syncope and headaches.  Hematological:  Negative for adenopathy. Does not bruise/bleed easily.  Psychiatric/Behavioral:  Negative for decreased concentration and dysphoric mood. The patient is not nervous/anxious.        Objective:   Physical Exam Constitutional:      General: She is not in acute distress.    Appearance: Normal appearance. She is well-developed. She is obese. She is not ill-appearing or diaphoretic.  HENT:     Head: Normocephalic and atraumatic.   Eyes:     Conjunctiva/sclera: Conjunctivae normal.     Pupils: Pupils are equal, round, and reactive to light.   Neck:     Thyroid : No thyromegaly.     Vascular: No carotid bruit or JVD.   Cardiovascular:     Rate and Rhythm: Normal rate and regular rhythm.     Heart sounds: Normal heart sounds.     No gallop.  Pulmonary:     Effort: Pulmonary effort is normal. No respiratory distress.     Breath sounds: Normal breath sounds. No stridor. No wheezing, rhonchi or rales.     Comments: Good air exch No wheeze even with forced exp  Abdominal:     General: Abdomen is protuberant. Bowel sounds are normal. There is no distension or abdominal bruit.     Palpations: Abdomen is soft. There is no fluid wave, hepatomegaly, splenomegaly, mass or pulsatile mass.     Tenderness: There is abdominal tenderness in the right lower quadrant and left lower quadrant. There is no right CVA tenderness, left CVA tenderness, guarding or rebound. Negative signs include Murphy's sign and McBurney's sign.     Comments: Mild tenderness of bilat LQ to deep palpation    Musculoskeletal:     Cervical back: Normal range of motion and neck supple.     Right lower leg: No edema.     Left lower leg: No edema.  Lymphadenopathy:  Cervical: No cervical adenopathy.   Skin:    General: Skin is warm and dry.     Coloration: Skin is not pale.     Findings: No rash.   Neurological:     Mental Status: She is alert.     Coordination: Coordination normal.     Deep Tendon Reflexes: Reflexes are normal and symmetric. Reflexes normal.   Psychiatric:        Mood and Affect: Mood normal.           Assessment & Plan:   Problem List Items Addressed This Visit       Respiratory   Asthma   Pt notes some wheezing when it is humid or air quality is not good Uses albuterol  mdi prn  Normal exam today  Will continue to follow       Relevant Medications   albuterol  (VENTOLIN  HFA) 108 (90 Base) MCG/ACT inhaler     Digestive   IBS - Primary   Suspect this is cause of her soft stools and low abd bloating/discomofrt  Some improvement with fiber  Also trying a probiotic   Encouraged to eat more produce if able Follow up with GI as planned next month for this and to plan colonoscopy        Other   Diffuse abdominal pain   Now more low abd/bloating feeling Will see GI as planned       Diarrhea   Improved but not gone  Reassuring labs including stool culture /GI profile and also tick labs   Follow up planned with GI next mo

## 2023-12-18 NOTE — Assessment & Plan Note (Signed)
 Pt notes some wheezing when it is humid or air quality is not good Uses albuterol  mdi prn  Normal exam today  Will continue to follow

## 2023-12-18 NOTE — Assessment & Plan Note (Signed)
 Suspect this is cause of her soft stools and low abd bloating/discomofrt  Some improvement with fiber  Also trying a probiotic   Encouraged to eat more produce if able Follow up with GI as planned next month for this and to plan colonoscopy

## 2023-12-18 NOTE — Assessment & Plan Note (Signed)
 Now more low abd/bloating feeling Will see GI as planned

## 2023-12-20 ENCOUNTER — Encounter: Payer: Self-pay | Admitting: Family Medicine

## 2023-12-21 MED ORDER — TRIAMCINOLONE ACETONIDE 0.1 % EX CREA: 1.0000 | TOPICAL_CREAM | Freq: Two times a day (BID) | CUTANEOUS | 1 refills | Status: AC

## 2023-12-28 ENCOUNTER — Ambulatory Visit

## 2023-12-28 ENCOUNTER — Ambulatory Visit
Admission: RE | Admit: 2023-12-28 | Discharge: 2023-12-28 | Disposition: A | Discharge status: Home / Self Care | Source: Ambulatory Visit | Attending: Family Medicine | Admitting: Family Medicine

## 2023-12-28 DIAGNOSIS — Z1231 Encounter for screening mammogram for malignant neoplasm of breast: Secondary | ICD-10-CM

## 2024-01-01 ENCOUNTER — Ambulatory Visit: Payer: Self-pay | Admitting: Family Medicine

## 2024-01-09 ENCOUNTER — Other Ambulatory Visit: Payer: Self-pay | Admitting: Internal Medicine

## 2024-01-16 NOTE — Progress Notes (Unsigned)
 Ellouise Console, PA-C [address] [place], [place]  [address] Phone: [phone]   Gastroenterology Consultation  Referring Provider:     Randeen Laine LABOR, MD Primary Care Physician:  Tower, Laine LABOR, MD Primary Gastroenterologist:  Ellouise Console, PA-C / Glendia Holt, MD  Reason for Consultation:     Chronic loose stool, GERD, discuss repeat colonoscopy and EGD        HPI:   Lissa Rowles is a 58 y.o. y/o female referred for consultation & management  by Randeen Laine LABOR, MD.    History of GERD, fatty liver, rectocele, IBS.  Has history of diarrhea alternating with constipation.    Lab 12/04/2023 showed elevated alk phos 130.  All other LFTs normal.  Current symptoms: Patient has had a flareup of GI symptoms for 2 months.  She has generalized diffuse upper abdominal cramping, abdominal bloating, gas, nausea, and episodes of diarrhea.  Has mucus in her stool.  Denies rectal bleeding or weight loss.  She also has increased acid reflux.  Nexium  helps.  She is eating bland diet.  No sick exposures.  No new medications.  No recent antibiotics or travel.  She tried Metamucil with mild benefit.  Her father had Barrett's esophagus and she is requesting repeat EGD To screen for Barrett's.  Stool test 12/04/2023 by PCP showed negative GI pathogen panel and negative C. difficile.  Labs significant for past history of recommend spotted fever.  No current active infection.  Normal TSH, lipase, and CBC.  Hgb 14.2, WBC 5.4.  08/2010 last EGD by Dr. Rosalie: Tiny hiatal hernia, single small gastric polyp, normal duodenum.  08/2010 last colonoscopy by Dr. Rosalie: Internal and external hemorrhoids.  Small hyperplastic sigmoid colon polyps.  10-year repeat colonoscopy is overdue.  Past Medical History:  Diagnosis Date   Abnormal Pap smear of cervix    cryotx   Anxiety    Arthritis    Asthma    Depression    Endometriosis    Fatty liver    Gastric polyp    GERD (gastroesophageal reflux disease)     Hemorrhoids    Hiatal hernia    HLD (hyperlipidemia)    Hyperplastic colon polyp    IBS (irritable bowel syndrome)    Iron deficiency anemia 1/12   Pneumonia    Rectocele    Rotator cuff tear, left 2009   Syncope    Tachycardia    s/p negative cardiac work up with echo   Uterine fibroid [id number]    Past Surgical History:  Procedure Laterality Date   CARPAL TUNNEL RELEASE Bilateral    COLONOSCOPY  1999   small polyps, hemms   ESOPHAGOGASTRODUODENOSCOPY  2/12   gastric polyp and HH   HYSTEROSCOPY     LAPAROSCOPY  2006   endometriosis   ROTATOR CUFF REPAIR Left    TRIGGER FINGER RELEASE      Prior to Admission medications   Medication Sig Start Date End Date Taking? Authorizing Provider  albuterol  (VENTOLIN  HFA) 108 (90 Base) MCG/ACT inhaler INHALE 2 PUFFS INTO THE LUNGS EVERY 4 HOURS AS NEEDED FOR WHEEZE 12/18/23   Tower, Laine LABOR, MD  aspirin EC 81 MG tablet Take 81 mg by mouth daily.    [provider]  atorvastatin  (LIPITOR) 80 MG tablet TAKE 1 TABLET (80 MG TOTAL) BY MOUTH DAILY. SCHEDULE OFFICE VISIT FOR FUTURE REFILLS. THIRD ATTEMPT. 01/09/24   Alvan Dorn FALCON, MD  CALCIUM  PO Take by mouth.  [provider]  cholecalciferol (VITAMIN D3) 25 MCG (1000 UNIT) tablet Take 1,000 Units by mouth daily.    [provider]  diphenhydrAMINE (BENADRYL) 25 MG tablet Take 75 mg by mouth at bedtime as needed for sleep.    [provider]  esomeprazole  (NEXIUM ) 40 MG capsule TAKE 1 CAPSULE BY MOUTH EVERY DAY IN THE MORNING 11/30/23   Tower, Laine LABOR, MD  fluticasone  (FLONASE ) 50 MCG/ACT nasal spray Place 1 spray into both nostrils daily for 14 days. 06/23/20 12/18/23  Avegno, Komlanvi S, FNP  montelukast  (SINGULAIR ) 10 MG tablet TAKE 1 TABLET BY MOUTH EVERYDAY AT BEDTIME 11/30/23   Tower, Laine A, MD  naproxen sodium (ALEVE) 220 MG tablet Take 440 mg by mouth daily as needed (pain).    [provider]  Omega-3 Fatty Acids (FISH OIL OMEGA-3 PO) Take  by mouth.    [provider]  traZODone (DESYREL) 50 MG tablet Take 25 mg by mouth at bedtime.    [provider]  triamcinolone  cream (KENALOG ) 0.1 % Apply 1 Application topically 2 (two) times daily. 12/21/23   Tower, Laine LABOR, MD  vitamin C (ASCORBIC ACID) 500 MG tablet Take 500 mg by mouth daily.    [provider]  vitamin E  (VITAMIN E ) 400 UNIT capsule Take 2 capsules (800 Units total) by mouth daily. 06/05/18   Pyrtle, Gordy HERO, MD  zinc gluconate 50 MG tablet Take 50 mg by mouth daily.    [provider]    Family History  Problem Relation Age of Onset   Dementia Mother    Allergic rhinitis Mother    Hypertension Father    Diabetes Father    Stroke Father    Migraines Father    Barrett's esophagus Father    Anemia Father        iron def   Heart disease Father        pacemaker, a fib, CHF   Bipolar disorder Father    Diabetes Sister    Hyperlipidemia Sister    Deep vein thrombosis Sister    Anxiety disorder Daughter    Bipolar disorder Daughter    ADD / ADHD Daughter    Neuropathy Daughter    Syncope episode Daughter    COPD Paternal Grandmother    Breast cancer Paternal Grandmother 19 - 68       double mastectomy   Diabetes Paternal Grandfather    Angioedema Neg Hx    Asthma Neg Hx    Atopy Neg Hx    Eczema Neg Hx    Immunodeficiency Neg Hx    Urticaria Neg Hx      Social History   Tobacco Use   Smoking status: Former    Current packs/day: 0.00    Average packs/day: 0.3 packs/day for 5.0 years (1.3 ttl pk-yrs)    Types: Cigarettes    Start date: 07/04/1987    Quit date: 07/03/1992    Years since quitting: 31.5    Passive exposure: Past   Smokeless tobacco: Never  Vaping Use   Vaping status: Never Used  Substance Use Topics   Alcohol use: Yes    Alcohol/week: 1.0 standard drink of alcohol    Types: 1 Glasses of wine per week   Drug use: No    Allergies as of 01/17/2024 - Review Complete 01/17/2024  Allergen Reaction  Noted   Codeine  Itching 11/11/2020   Hydrocodone-acetaminophen  Itching 08/16/2015   Lactose intolerance (gi)  03/13/2022   Other  12/08/2020   Povidone-iodine Rash 06/04/2007   Propoxyphene n-acetaminophen  Itching 08/23/2007   Tolmetin Nausea And Vomiting 09/03/2008    Review of Systems:    All systems reviewed and negative except where noted in HPI.   Physical Exam:  BP 110/70   Pulse 75   Ht 5' 7.5 (1.715 m)   Wt 205 lb (93 kg)   LMP 03/06/2014   BMI 31.63 kg/m  Patient's last menstrual period was 03/06/2014.  General:   Alert,  Well-developed, well-nourished, pleasant and cooperative in NAD Lungs:  Respirations even and unlabored.  Clear throughout to auscultation.   No wheezes, crackles, or rhonchi. No acute distress. Heart:  Regular rate and rhythm; no murmurs, clicks, rubs, or gallops. Abdomen:  Normal bowel sounds.  No bruits.  Soft, and non-distended without masses, hepatosplenomegaly or hernias noted.  No Tenderness.  No guarding or rebound tenderness.    Neurologic:  Alert and oriented x3;  grossly normal neurologically. Psych:  Alert and cooperative. Normal mood and affect.  Imaging Studies: MM 3D SCREENING MAMMOGRAM BILATERAL BREAST Result Date: 01/01/2024 CLINICAL DATA:  Screening. EXAM: DIGITAL SCREENING BILATERAL MAMMOGRAM WITH TOMOSYNTHESIS AND CAD TECHNIQUE: Bilateral screening digital craniocaudal and mediolateral oblique mammograms were obtained. Bilateral screening digital breast tomosynthesis was performed. The images were evaluated with computer-aided detection. COMPARISON:  Previous exam(s). ACR Breast Density Category b: There are scattered areas of fibroglandular density. FINDINGS: There are no findings suspicious for malignancy. IMPRESSION: No mammographic evidence of malignancy. A result letter of this screening mammogram will be mailed directly to the patient. RECOMMENDATION: Screening mammogram in one year. (Code:SM-B-01Y) BI-RADS CATEGORY  1: Negative.  Electronically Signed   By: Corean Salter M.D.   On: 01/01/2024 11:44    Labs: CBC    Component Value Date/Time   WBC 5.4 12/04/2023 0929   RBC 4.76 12/04/2023 0929   HGB 14.2 12/04/2023 0929   HGB 13.5 03/28/2013 1043   HCT 42.0 12/04/2023 0929   HCT 39.7 03/28/2013 1043   PLT 227.0 12/04/2023 0929   PLT 249 03/28/2013 1043   MCV 88.2 12/04/2023 0929   MCV 89.5 03/28/2013 1043   MCH 30.7 02/25/2018 1945   MCHC 33.8 12/04/2023 0929   RDW 13.2 12/04/2023 0929   RDW 12.9 03/28/2013 1043   LYMPHSABS 1.9 12/04/2023 0929   LYMPHSABS 1.6 03/28/2013 1043   MONOABS 0.5 12/04/2023 0929   MONOABS 0.5 03/28/2013 1043   EOSABS 0.1 12/04/2023 0929   EOSABS 0.3 03/28/2013 1043   BASOSABS 0.0 12/04/2023 0929   BASOSABS 0.0 03/28/2013 1043    CMP     Component Value Date/Time   NA 142 12/04/2023 0929   NA 140 12/03/2018 0913   NA 140 03/28/2013 1044   K 4.4 12/04/2023 0929   K 4.3 03/28/2013 1044   CL 104 12/04/2023 0929   CO2 30 12/04/2023 0929   CO2 27 03/28/2013 1044   GLUCOSE 159 (H) 12/04/2023 0929   GLUCOSE 123 03/28/2013 1044   BUN 11 12/04/2023 0929   BUN 10 12/03/2018 0913   BUN 8.5 03/28/2013 1044   CREATININE 0.86 12/04/2023 0929   CREATININE 0.8 03/28/2013 1044   CALCIUM  9.8 12/04/2023 0929   CALCIUM  9.2 03/28/2013 1044   PROT 6.2 12/04/2023 0929   ALBUMIN 4.2 12/04/2023 0929   AST 23 12/04/2023 0929   ALT 28 12/04/2023 0929   ALKPHOS 130 (H) 12/04/2023 0929   BILITOT 0.7 12/04/2023 0929   GFRNONAA 88 12/03/2018 0913   GFRAA 102 12/03/2018  9086    Assessment and Plan:   Sheyla Zaffino is a 58 y.o. y/o female has been referred for worsening diarrhea.  Recent stool studies by PCP showed negative GI pathogen panel, negative C. difficile.  1.  Irritable bowel syndrome with diarrhea.  Recent worsening diarrhea.  Stool studies negative. - Continue Metamucil daily - Rx dicyclomine  10 Mg 3 times daily as needed abdominal cramping. - Start align probiotic 1  capsule daily. - Low FODMAP diet given.  2.  Chronic GERD; Family History of Father with Barrett's - Continue Nexium  40mg  daily - Schedule EGD to screen for Barretts I discussed risks of EGD with patient to include risk of bleeding, perforation, and risk of sedation.  Patient expressed understanding and agrees to proceed with EGD.  - GERD diet.    3.  Fatty liver; lab 12/2023 showed mildly elevated alk phos.  Normal AST and ALT. -  Fibrosis 4 Score = 1.13 (Low risk)      Interpretation for patients with NAFLD          <1.30       -  F0-F1 (Low risk)          1.30-2.67 -  Indeterminate           >2.67      -  F3-F4 (High risk)     Validated for ages 39-65 - Recommend a low-fat diet, regular exercise, and weight loss. Patient education handout about fatty liver disease was given and discussed from up-to-date.   4.  Colon cancer screening: Last colonoscopy 08/2010 showed small hyperplastic polyps removed.  10-year repeat screening colonoscopy is overdue. - Scheduling Colonoscopy I discussed risks of colonoscopy with patient to include risk of bleeding, colon perforation, and risk of sedation.  Patient expressed understanding and agrees to proceed with colonoscopy.   Follow up with TG 4 weeks after procedures.  Ellouise Console, PA-C

## 2024-01-17 ENCOUNTER — Ambulatory Visit: Admitting: Physician Assistant

## 2024-01-17 ENCOUNTER — Encounter: Payer: Self-pay | Admitting: Physician Assistant

## 2024-01-17 VITALS — BP 110/70 | HR 75 | Ht 67.5 in | Wt 205.0 lb

## 2024-01-17 DIAGNOSIS — Z1211 Encounter for screening for malignant neoplasm of colon: Secondary | ICD-10-CM

## 2024-01-17 DIAGNOSIS — Z8379 Family history of other diseases of the digestive system: Secondary | ICD-10-CM

## 2024-01-17 DIAGNOSIS — K760 Fatty (change of) liver, not elsewhere classified: Secondary | ICD-10-CM

## 2024-01-17 DIAGNOSIS — K580 Irritable bowel syndrome with diarrhea: Secondary | ICD-10-CM | POA: Diagnosis not present

## 2024-01-17 DIAGNOSIS — Z860102 Personal history of hyperplastic colon polyps: Secondary | ICD-10-CM | POA: Diagnosis not present

## 2024-01-17 DIAGNOSIS — K219 Gastro-esophageal reflux disease without esophagitis: Secondary | ICD-10-CM

## 2024-01-17 MED ORDER — NA SULFATE-K SULFATE-MG SULF 17.5-3.13-1.6 GM/177ML PO SOLN: ORAL | 0 refills | Status: DC

## 2024-01-17 MED ORDER — DICYCLOMINE HCL 10 MG PO CAPS: 10.0000 mg | ORAL_CAPSULE | Freq: Three times a day (TID) | ORAL | 2 refills | Status: DC

## 2024-01-17 NOTE — Patient Instructions (Addendum)
 Start Rx Dicyclomine  10mg  1 tablet 3 times daily for IBS / Abdominal Cramping.  Start Align Probiotic Take 1 Capsule Once Daily for 30 days. If GI symptoms improve, then OK to continue Align. If GI symptoms do not improve after 30 days, then discontinue.   Continue Metamucil daily.   You have been scheduled for an endoscopy and colonoscopy. Please follow the written instructions given to you at your visit today.  If you use inhalers (even only as needed), please bring them with you on the day of your procedure.  DO NOT TAKE 7 DAYS PRIOR TO TEST- Trulicity (dulaglutide) Ozempic, Wegovy (semaglutide) Mounjaro (tirzepatide) Bydureon Bcise (exanatide extended release)  DO NOT TAKE 1 DAY PRIOR TO YOUR TEST Rybelsus (semaglutide) Adlyxin (lixisenatide) Victoza (liraglutide) Byetta (exanatide) ___________________________________________________________________________  We have sent the following medications to your pharmacy for you to pick up at your convenience: Suprep  _______________________________________________________  If your blood pressure at your visit was 140/90 or greater, please contact your primary care physician to follow up on this.  _______________________________________________________  If you are age 59 or older, your body mass index should be between 23-30. Your Body mass index is 31.63 kg/m. If this is out of the aforementioned range listed, please consider follow up with your Primary Care Provider.  If you are age 59 or younger, your body mass index should be between 19-25. Your Body mass index is 31.63 kg/m. If this is out of the aformentioned range listed, please consider follow up with your Primary Care Provider.   ________________________________________________________  The [facility] providers would like to encourage you to use MYCHART to communicate with providers for non-urgent requests or questions.  Due to long hold times on the telephone, sending  your provider a message by [facility] may be a faster and more efficient way to get a response.  Please allow 48 business hours for a response.  Please remember that this is for non-urgent requests.  _______________________________________________________  Due to recent changes in healthcare laws, you may see the results of your imaging and laboratory studies on MyChart before your provider has had a chance to review them.  We understand that in some cases there may be results that are confusing or concerning to you. Not all laboratory results come back in the same time frame and the provider may be waiting for multiple results in order to interpret others.  Please give us  48 hours in order for your provider to thoroughly review all the results before contacting the office for clarification of your results.   Thank you for trusting me with your gastrointestinal care!   Ellouise Console, PA-C

## 2024-01-21 ENCOUNTER — Telehealth: Payer: Self-pay | Admitting: Internal Medicine

## 2024-01-21 DIAGNOSIS — E782 Mixed hyperlipidemia: Secondary | ICD-10-CM

## 2024-01-21 NOTE — Telephone Encounter (Signed)
Patient is requesting updated lab orders

## 2024-01-21 NOTE — Telephone Encounter (Signed)
 I made the pt a follow up appt with Dr Mona for 04/07/24 and she is asking if Dr Mona would like any labs prior to seeing her... there is an order from last year for NMR and LPA that she did not have drawn.. I will send to Dr Mona to see if any others are needed.

## 2024-01-22 NOTE — Telephone Encounter (Signed)
 Left message for patient to callback or reply to MyChart message.  NMR and LP(a) ordered and released to [facility].

## 2024-01-22 NOTE — Telephone Encounter (Signed)
 MyChart message viewed by patient: Last read by Timmie Daring at 9:29AM on 01/22/2024.   Closing encounter.

## 2024-01-23 ENCOUNTER — Ambulatory Visit: Admitting: Primary Care

## 2024-01-23 VITALS — BP 144/76 | HR 70 | Temp 96.9°F | Ht 67.5 in | Wt 205.0 lb

## 2024-01-23 DIAGNOSIS — J0110 Acute frontal sinusitis, unspecified: Secondary | ICD-10-CM | POA: Insufficient documentation

## 2024-01-23 MED ORDER — AMOXICILLIN-POT CLAVULANATE 875-125 MG PO TABS: 1.0000 | ORAL_TABLET | Freq: Two times a day (BID) | ORAL | 0 refills | Status: DC

## 2024-01-23 MED ORDER — PREDNISONE 20 MG PO TABS: ORAL_TABLET | ORAL | 0 refills | Status: DC

## 2024-01-23 NOTE — Assessment & Plan Note (Signed)
 HPI and symptoms are representative.  Given duration of symptoms, coupled with presentation, will treat.  Start Augmentin  antibiotics. Take 1 tablet by mouth twice daily for 7 days. Start prednisone  20 mg tablets. Take 2 tablets by mouth once daily in the morning for 5 days.  Follow up PRN
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
## 2024-01-23 NOTE — Patient Instructions (Signed)
 Start Augmentin  antibiotics. Take 1 tablet by mouth twice daily for 7 days.  Start prednisone  20 mg tablets. Take 2 tablets by mouth once daily in the morning for 5 days.  It was a pleasure to see you today!

## 2024-01-27 NOTE — Progress Notes (Signed)
 Agree with the assessment and plan as outlined by Ellouise Console, PA-C.  Would also exclude PBC as cause of chronic ALP elevation.  Obtain anti-mitochondrial Ab level and GGT level.

## 2024-01-30 ENCOUNTER — Other Ambulatory Visit: Payer: Self-pay

## 2024-01-30 DIAGNOSIS — R748 Abnormal levels of other serum enzymes: Secondary | ICD-10-CM

## 2024-01-30 NOTE — Progress Notes (Signed)
 Orders in epic and pt notified to come for labs via mychart.

## 2024-02-01 ENCOUNTER — Other Ambulatory Visit (INDEPENDENT_AMBULATORY_CARE_PROVIDER_SITE_OTHER)

## 2024-02-01 DIAGNOSIS — R748 Abnormal levels of other serum enzymes: Secondary | ICD-10-CM

## 2024-02-01 LAB — GAMMA GT: GGT: 32 U/L (ref 7–51)

## 2024-02-03 ENCOUNTER — Other Ambulatory Visit: Payer: Self-pay | Admitting: Cardiology

## 2024-02-04 ENCOUNTER — Ambulatory Visit: Payer: Self-pay | Admitting: Physician Assistant

## 2024-02-06 LAB — MITOCHONDRIAL ANTIBODIES: Mitochondrial M2 Ab, IgG: <=20 U (ref <=20.0)

## 2024-02-27 ENCOUNTER — Encounter: Payer: Self-pay | Admitting: Gastroenterology

## 2024-02-27 ENCOUNTER — Ambulatory Visit: Admitting: Gastroenterology

## 2024-02-27 VITALS — BP 115/62 | HR 61 | Temp 97.4°F | Resp 13

## 2024-02-27 DIAGNOSIS — R197 Diarrhea, unspecified: Secondary | ICD-10-CM | POA: Diagnosis not present

## 2024-02-27 DIAGNOSIS — K580 Irritable bowel syndrome with diarrhea: Secondary | ICD-10-CM

## 2024-02-27 DIAGNOSIS — K644 Residual hemorrhoidal skin tags: Secondary | ICD-10-CM

## 2024-02-27 DIAGNOSIS — Z1211 Encounter for screening for malignant neoplasm of colon: Secondary | ICD-10-CM | POA: Diagnosis present

## 2024-02-27 DIAGNOSIS — Z13810 Encounter for screening for upper gastrointestinal disorder: Secondary | ICD-10-CM | POA: Diagnosis not present

## 2024-02-27 DIAGNOSIS — K317 Polyp of stomach and duodenum: Secondary | ICD-10-CM | POA: Diagnosis not present

## 2024-02-27 DIAGNOSIS — K219 Gastro-esophageal reflux disease without esophagitis: Secondary | ICD-10-CM | POA: Diagnosis not present

## 2024-02-27 DIAGNOSIS — K635 Polyp of colon: Secondary | ICD-10-CM

## 2024-02-27 DIAGNOSIS — D125 Benign neoplasm of sigmoid colon: Secondary | ICD-10-CM

## 2024-02-27 MED ORDER — SODIUM CHLORIDE 0.9 % IV SOLN: 500.0000 mL | Freq: Once | INTRAVENOUS | Status: DC

## 2024-02-27 MED ORDER — HYDROCORTISONE (PERIANAL) 2.5 % EX CREA
1.0000 | TOPICAL_CREAM | Freq: Two times a day (BID) | CUTANEOUS | 1 refills | Status: AC
Start: 2024-02-27 — End: ?

## 2024-02-27 NOTE — Progress Notes (Signed)
 Pt's states no medical or surgical changes since previsit or office visit.
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
## 2024-02-27 NOTE — Patient Instructions (Signed)

## 2024-02-27 NOTE — Op Note (Signed)
 [facility] Patient Name: Michele [name] Procedure Date: 02/27/2024 1:19 PM MRN: [id number] Endoscopist: Glendia E. Stacia , MD, [id number] Age: 58 Referring MD:  Date of Birth: 21-Mar-1966 Gender: Female Account #: [id number] Procedure:                Colonoscopy Indications:              Screening for colorectal malignant neoplasm (last                            colonoscopy was more than 10 years ago), Incidental                            diarrhea noted Medicines:                Monitored Anesthesia Care Procedure:                Pre-Anesthesia Assessment:                           - Prior to the procedure, a History and Physical                            was performed, and patient medications and                            allergies were reviewed. The patient's tolerance of                            previous anesthesia was also reviewed. The risks                            and benefits of the procedure and the sedation                            options and risks were discussed with the patient.                            All questions were answered, and informed consent                            was obtained. Prior Anticoagulants: The patient has                            taken no anticoagulant or antiplatelet agents. ASA                            Grade Assessment: II - A patient with mild systemic                            disease. After reviewing the risks and benefits,                            the patient was deemed in satisfactory condition to  undergo the procedure.                           After obtaining informed consent, the colonoscope                            was passed under direct vision. Throughout the                            procedure, the patient's blood pressure, pulse, and                            oxygen saturations were monitored continuously. The                            CF [id number] #[id number] was introduced  through the anus                            and advanced to the the cecum, identified by                            appendiceal orifice and ileocecal valve. The                            colonoscopy was performed without difficulty. The                            patient tolerated the procedure well. The quality                            of the bowel preparation was good. The ileocecal                            valve, appendiceal orifice, and rectum were                            photographed. The bowel preparation used was SUPREP                            via split dose instruction. Scope In: 2:03:24 PM Scope Out: 2:20:06 PM Scope Withdrawal Time: 0 hours 15 minutes 5 seconds  Total Procedure Duration: 0 hours 16 minutes 42 seconds  Findings:                 A large, nonthrombosed external hemorrhoid was                            found on perianal exam.                           The digital rectal exam was normal. Pertinent                            negatives include normal sphincter tone and no  palpable rectal lesions.                           Two sessile polyps were found in the splenic                            flexure. The polyps were 4 to 5 mm in size. These                            polyps were removed with a cold snare. Resection                            and retrieval were complete. Estimated blood loss                            was minimal.                           A 4 mm polyp was found in the sigmoid colon. The                            polyp was sessile. The polyp was removed with a                            cold snare. Resection and retrieval were complete.                            Estimated blood loss was minimal.                           The exam was otherwise normal throughout the                            examined colon.                           Normal mucosa was found in the entire colon.                             Biopsies for histology were taken with a cold                            forceps from the ascending colon, transverse colon,                            descending colon and sigmoid colon for evaluation                            of microscopic colitis. Estimated blood loss was                            minimal.  The retroflexed view of the distal rectum and anal                            verge was normal and showed no anal or rectal                            abnormalities. Complications:            No immediate complications. Estimated Blood Loss:     Estimated blood loss was minimal. Impression:               - Hemorrhoid found on perianal exam.                           - Two 4 to 5 mm polyps at the splenic flexure,                            removed with a cold snare. Resected and retrieved.                           - One 4 mm polyp in the sigmoid colon, removed with                            a cold snare. Resected and retrieved.                           - Normal mucosa in the entire examined colon.                            Biopsied.                           - The distal rectum and anal verge are normal on                            retroflexion view. Recommendation:           - Patient has a contact number available for                            emergencies. The signs and symptoms of potential                            delayed complications were discussed with the                            patient. Return to normal activities tomorrow.                            Written discharge instructions were provided to the                            patient.                           - Resume previous  diet.                           - Await pathology results.                           - Repeat colonoscopy (date not yet determined) for                            surveillance based on pathology results.                           - Recommend topical hemorrhoid  cream (Anusol ) for                            1-2 weeks to reduce swelling Hollie Bartus E. Stacia, MD 02/27/2024 2:34:15 PM This report has been signed electronically.

## 2024-02-27 NOTE — Op Note (Signed)
 [facility] Patient Name: Michele [name] Procedure Date: 02/27/2024 1:20 PM MRN: [id number] Endoscopist: Glendia E. Stacia , MD, [id number] Age: 58 Referring MD:  Date of Birth: 1965-08-18 Gender: Female Account #: [id number] Procedure:                Upper GI endoscopy Indications:              Screening for Barrett's esophagus Medicines:                Monitored Anesthesia Care Procedure:                Pre-Anesthesia Assessment:                           - Prior to the procedure, a History and Physical                            was performed, and patient medications and                            allergies were reviewed. The patient's tolerance of                            previous anesthesia was also reviewed. The risks                            and benefits of the procedure and the sedation                            options and risks were discussed with the patient.                            All questions were answered, and informed consent                            was obtained. Prior Anticoagulants: The patient has                            taken no anticoagulant or antiplatelet agents. ASA                            Grade Assessment: II - A patient with mild systemic                            disease. After reviewing the risks and benefits,                            the patient was deemed in satisfactory condition to                            undergo the procedure.                           After obtaining informed consent, the endoscope was  passed under direct vision. Throughout the                            procedure, the patient's blood pressure, pulse, and                            oxygen saturations were monitored continuously. The                            Olympus Scope SN [id number] was introduced through the                            mouth, and advanced to the second part of duodenum.                            The upper  GI endoscopy was accomplished without                            difficulty. The patient tolerated the procedure                            well. Scope In: Scope Out: Findings:                 The examined portions of the nasopharynx,                            oropharynx and larynx were normal.                           The examined esophagus was normal.                           Multiple 2 to 6 mm sessile polyps were found in the                            gastric body. The polyp was removed with a cold                            snare. Resection and retrieval were complete.                            Estimated blood loss was minimal.                           The exam of the stomach was otherwise normal.                           The examined duodenum was normal. Complications:            No immediate complications. Estimated Blood Loss:     Estimated blood loss was minimal. Impression:               - The examined portions of the nasopharynx,  oropharynx and larynx were normal.                           - Normal esophagus. No evidence of Barrett's                            esophagus.                           - Multiple gastric polyps. One was resected and                            retrieved. These were consistent with fundic gland                            polyps.                           - Normal examined duodenum. Recommendation:           - Patient has a contact number available for                            emergencies. The signs and symptoms of potential                            delayed complications were discussed with the                            patient. Return to normal activities tomorrow.                            Written discharge instructions were provided to the                            patient.                           - Resume previous diet.                           - Continue present medications.                           -  Await pathology results. Jayjay Littles E. Stacia, MD 02/27/2024 2:26:43 PM This report has been signed electronically.

## 2024-02-27 NOTE — Progress Notes (Signed)
 Vss nad trans to pacu

## 2024-02-27 NOTE — Progress Notes (Signed)
 Called to room to assist during endoscopic procedure.  Patient ID and intended procedure confirmed with present staff. Received instructions for my participation in the procedure from the performing physician.

## 2024-02-28 ENCOUNTER — Telehealth: Payer: Self-pay

## 2024-02-28 NOTE — Telephone Encounter (Signed)
  Follow up Call-     02/27/2024   12:44 PM  Call back number  Post procedure Call Back phone  # [phone]  Permission to leave phone message Yes     Patient questions:  Do you have a fever, pain , or abdominal swelling? No. Pain Score  0 *  Have you tolerated food without any problems? Yes.    Have you been able to return to your normal activities? Yes.    Do you have any questions about your discharge instructions: Diet   No. Medications  No. Follow up visit  No.  Do you have questions or concerns about your Care? No.  Actions: * If pain score is 4 or above: No action needed, pain <4.

## 2024-03-05 LAB — SURGICAL PATHOLOGY

## 2024-03-06 ENCOUNTER — Ambulatory Visit: Payer: Self-pay | Admitting: Gastroenterology

## 2024-03-06 NOTE — Progress Notes (Signed)
 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  There was no evidence of infection with Helicobacter pylori or intestinal metaplasia/dysplasia.  These types of polyps are typically secondary to acid suppression therapy and no specific follow-up is required for these small, benign polyps.  The polyps that I removed from your colon were NOT precancerous.  You should continue to follow current colorectal cancer screening guidelines with a repeat colonoscopy in 10 years.    The biopsies taken from your colon were normal and there was no evidence of microscopic colitis or chronic inflammatory changes.   Follow up as needed in the office for ongoing management of your chronic diarrhea.

## 2024-03-17 ENCOUNTER — Ambulatory Visit: Payer: Self-pay

## 2024-03-17 NOTE — Telephone Encounter (Signed)
 FYI Only or Action Required?: FYI only for provider.  Patient was last seen in primary care on 01/23/2024 by Gretta Comer POUR, NP.  Called Nurse Triage reporting Headache and Generalized Body Aches.  Symptoms began yesterday.  Interventions attempted: OTC medications: motrin, mucinex  D and Rest, hydration, or home remedies.  Symptoms are: unchanged.  Triage Disposition: See HCP Within 4 Hours (Or PCP Triage)  Patient/caregiver understands and will follow disposition?: Yes     Copied from CRM [id number]. Topic: Clinical - Red Word Triage >> Mar 17, 2024  4:39 PM Donee H wrote: Red Word that prompted transfer to Nurse Triage: Patient experiencing body aches all over, major headache, cold and chills. She stated started last night. She feels really warm. She wants to see about getting an appointment to be seen as soon as possible. Reason for Disposition  [1] SEVERE headache (e.g., excruciating) AND [2] not improved after 2 hours of pain medicine  Patient is HIGH RISK (e.g., 65 years and older, pregnant, HIV+, or chronic medical condition)  Answer Assessment - Initial Assessment Questions Additional info: No appointments are available until 03/19/24, patient will proceed to urgent care today for evaluation.    1. LOCATION: Where does it hurt?      Above eyebrown 2. ONSET: When did the headache start? (e.g., minutes, hours, days)      Last night  3. PATTERN: Does the pain come and go, or has it been constant since it started?     constant 4. SEVERITY: How bad is the pain? and What does it keep you from doing?  (e.g., Scale 1-10; mild, moderate, or severe)     8/10 5. RECURRENT SYMPTOM: Have you ever had headaches before? If Yes, ask: When was the last time? and What happened that time?      Yes, sinus infection 6. CAUSE: What do you think is causing the headache?     unsure 7. MIGRAINE: Have you been diagnosed with migraine headaches? If Yes, ask: Is this  headache similar?      no 8. HEAD INJURY: Has there been any recent injury to your head?      no 9. OTHER SYMPTOMS: Do you have any other symptoms? (e.g., fever, stiff neck, eye pain, sore throat, cold symptoms)     Nasal congestion, body aches  Answer Assessment - Initial Assessment Questions 1. SYMPTOMS: What is your main symptom or concern? (e.g., cough, fever, shortness of breath, muscle aches)     Body aches and headache 2. ONSET: When did the symptoms start?      03/16/24 3. COUGH: Do you have a cough? If Yes, ask: How bad is the cough?       Mild-has asthma 4. FEVER: Do you have a fever? If Yes, ask: What is your temperature, how was it measured, and when did it start?     Feels feverish, chills. Not measured 5. BREATHING DIFFICULTY: Are you having any difficulty breathing? (e.g., normal; shortness of breath, wheezing, unable to speak)      denies 6. BETTER-SAME-WORSE: Are you getting better, staying the same or getting worse compared to yesterday?  If getting worse, ask, In what way?     same 7. OTHER SYMPTOMS: Do you have any other symptoms?  (e.g., chills, fatigue, headache, loss of smell or taste, muscle pain, sore throat)     Headache, nasal congestion, sinus pressure 8. INFLUENZA EXPOSURE: Was there any known exposure to influenza (flu) before the symptoms began?  unsure 9. INFLUENZA SUSPECTED: Why do you think you have influenza? (e.g., positive flu self-test at home, symptoms after exposure).      10. INFLUENZA VACCINE: Have you had the flu vaccine? If Yes, ask: When did you last get it?        11. HIGH RISK FOR COMPLICATIONS: Do you have any chronic medical problems? (e.g., asthma, heart or lung disease, obesity, weak immune system)       asthma 12. PREGNANCY: Is there any chance you are pregnant? When was your last menstrual period?        13. O2 SATURATION MONITOR:  Do you use an oxygen saturation monitor (pulse oximeter) at  home? If Yes, ask What is your reading (oxygen level) today? What is your usual oxygen saturation reading? (e.g., 95%)  Protocols used: Headache-A-AH, Influenza (Flu) Suspected-A-AH

## 2024-03-18 NOTE — Telephone Encounter (Signed)
 Aware, will watch for correspondence

## 2024-04-05 ENCOUNTER — Other Ambulatory Visit: Payer: Self-pay | Admitting: Internal Medicine

## 2024-04-07 ENCOUNTER — Encounter: Payer: Self-pay | Admitting: Internal Medicine

## 2024-04-07 ENCOUNTER — Telehealth: Payer: Self-pay | Admitting: Internal Medicine

## 2024-04-07 ENCOUNTER — Ambulatory Visit: Attending: Internal Medicine | Admitting: Internal Medicine

## 2024-04-07 VITALS — BP 155/76 | HR 76 | Resp 16 | Ht 67.0 in | Wt 206.8 lb

## 2024-04-07 DIAGNOSIS — I10 Essential (primary) hypertension: Secondary | ICD-10-CM

## 2024-04-07 DIAGNOSIS — I2510 Atherosclerotic heart disease of native coronary artery without angina pectoris: Secondary | ICD-10-CM | POA: Diagnosis not present

## 2024-04-07 DIAGNOSIS — R0602 Shortness of breath: Secondary | ICD-10-CM | POA: Diagnosis not present

## 2024-04-07 DIAGNOSIS — R0789 Other chest pain: Secondary | ICD-10-CM | POA: Diagnosis not present

## 2024-04-07 MED ORDER — TELMISARTAN 40 MG PO TABS: 40.0000 mg | ORAL_TABLET | Freq: Every day | ORAL | 3 refills | Status: AC

## 2024-04-07 MED ORDER — METOPROLOL TARTRATE 100 MG PO TABS
100.0000 mg | ORAL_TABLET | ORAL | 0 refills | Status: AC
Start: 2024-04-07 — End: 2024-07-06

## 2024-04-07 NOTE — Telephone Encounter (Signed)
 Called and spoke with patient. Patient states she is currently at the lab waiting to get her blood drawn. Patient confirms she was able to check in. Confirmed with patient that lab orders for NMR lipoprofile and LPA are active in chart. Patient verbalized understanding.

## 2024-04-07 NOTE — Telephone Encounter (Signed)
 Patient called to confirm her lab orders are still current and will be having blood drawn this morning.

## 2024-04-07 NOTE — Patient Instructions (Addendum)
 Medication Instructions:   START telmisartan 40mg  once daily for blood pressure  TAKE a ONE-TIME dose of metoprolol  tartrate 2 hours before CT test  *If you need a refill on your cardiac medications before your next appointment, please call your pharmacy*  Lab Work: NON-FASTING BMET in 7-10 days  If you have labs (blood work) drawn today and your tests are completely normal, you will receive your results only by: MyChart Message (if you have MyChart) OR A paper copy in the mail If you have any lab test that is abnormal or we need to change your treatment, we will call you to review the results.  Testing/Procedures:  Coronary CTA -- you will get a call to schedule this test  Follow-Up: At [facility], you and your health needs are our priority.  As part of our continuing mission to provide you with exceptional heart care, our providers are all part of one team.  This team includes your primary Cardiologist (physician) and Advanced Practice Providers or APPs (Physician Assistants and Nurse Practitioners) who all work together to provide you with the care you need, when you need it.  Your next appointment:    12 months  We recommend signing up for the patient portal called MyChart.  Sign up information is provided on this After Visit Summary.  MyChart is used to connect with patients for Virtual Visits (Telemedicine).  Patients are able to view lab/test results, encounter notes, upcoming appointments, etc.  Non-urgent messages can be sent to your provider as well.   To learn more about what you can do with MyChart, go to [email or web address].   Other Instructions    Your cardiac CT will be scheduled at one of the below locations:   [facility] [address] [place], [place] [address] [phone] (Severe contrast allergies only)  OR   [facility] [address] [place], [place] [address] [phone]  OR    [facility] [address] [place], [place] [address] [phone]  OR   [facility] [address]  [place], [place] [address] [phone]  OR   [facility] [address] [place], [place] [phone]  If scheduled at [facility], please arrive at the [facility] and Children's Entrance (Entrance C2) of [facility] 30 minutes prior to test start time. You can use the FREE valet parking offered at entrance C (encouraged to control the heart rate for the test)  Proceed to the [facility] Radiology Department (first floor) to check-in and test prep.  All radiology patients and guests should use entrance C2 at [facility], accessed from [facility], even though the hospital's physical address listed is [address].  If scheduled at the Heart and Vascular Tower at [address], please enter the parking lot using the [address] entrance and use the FREE valet service at the patient drop-off area. Enter the building and check-in with registration on the main floor.  If scheduled at [facility], please arrive to the Heart and Vascular Center 15 mins early for check-in and test prep.  There is spacious parking and easy access to the radiology department from the [facility] and V[facility] entrance. Please enter here and check-in with the desk attendant.   If scheduled at [facility], please arrive 30 minutes early for check-in and test prep.  Please follow these instructions carefully (unless otherwise directed):  An IV will be required for this test and Nitroglycerin  will be given.  Hold all erectile dysfunction medications at least 3 days (72 hrs) prior to test. (Ie viagra, cialis, sildenafil, tadalafil, etc)   On the Night Before the Test: Be sure to Drink plenty of water. Do not consume any caffeinated/decaffeinated beverages or chocolate 12 hours  prior to your test. Do not take any antihistamines 12 hours prior to your test.   On the Day of the Test: Drink plenty of water until 1 hour prior to the test. Do not eat any food 1 hour prior to test. You may take your regular medications prior to the test.  Take metoprolol  (Lopressor ) two hours prior to test. If you take Furosemide/Hydrochlorothiazide/Spironolactone/Chlorthalidone, please HOLD on the morning of the test. Patients who wear a continuous glucose monitor MUST remove the device prior to scanning. FEMALES- please wear underwire-free bra if available, avoid dresses & tight clothing      After the Test: Drink plenty of water. After receiving IV contrast, you may experience a mild flushed feeling. This is normal. On occasion, you may experience a mild rash up to 24 hours after the test. This is not dangerous. If this occurs, you can take Benadryl 25 mg, Zyrtec , Claritin, or Allegra and increase your fluid intake. (Patients taking Tikosyn should avoid Benadryl, and may take Zyrtec , Claritin, or Allegra) If you experience trouble breathing, this can be serious. If it is severe call 911 IMMEDIATELY. If it is mild, please call our office.  We will call to schedule your test 2-4 weeks out understanding that some insurance companies will need an authorization prior to the service being performed.   For more information and frequently asked questions, please visit our website : [email or web address]  For non-scheduling related questions, please contact the cardiac imaging nurse navigator should you have any questions/concerns: Cardiac Imaging Nurse Navigators Direct Office Dial: [phone]   For scheduling needs, including cancellations and rescheduling, please call [place], [phone].

## 2024-04-07 NOTE — Progress Notes (Unsigned)
 OFFICE CONSULT NOTE  Chief Complaint:  Follow-up  Primary Care Physician: Tower, Laine LABOR, MD  HPI:  Michele [name] is a 58 y.o. female who is being seen today for the evaluation of chest pain at the request of Tower, Laine LABOR, MD.  This is a pleasant 58 year old female kindly referred for evaluation of chest pain.  Past medical history significant for anxiety, asthma, depression and recent diagnosis of bipolar disorder, hyperlipidemia, GERD, IBS and other medical problems.  Over the past several months she has had some progressive chest pressure as well as pain in her left neck rating down into the left arm.  This comes on a little more with exertion and is relieved at rest.  She and her husband have been doing more walking and on a concentrated program to try to lose weight.  She notes it is particularly difficult when she walks up an incline or stairs.  Family history significant for heart disease in her father who had A. fib heart failure and a pacemaker and early coronary disease in his 47s or 9s.  Her mom also has hyperlipidemia and she has a sister who has type 2 diabetes.  She has been on atorvastatin  for some time 20 mg.  Interestingly a recent lipid profile in October 2019 showed total cholesterol 283, HDL 47, LDL 208 and triglycerides 860.  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  We did discuss diet at some length today.  Although she does eat some atherogenic foods, and general diet recently has been lower in cholesterol and leaner meats.  She says she eats a lot of venison as well as vegetables and chicken.  With regards to her chest pain.  She describes it as a pressure or squeezing sensation, associated with some shortness of breath.  In addition she has been having palpitations.  This is awakening her at night and use occurs around 3 to 3:30 AM.  This is about an hour  before her husband wakes up.  She denies any intrusive dreams.  Her husband notes that she does not snore heavily, gas or breath or have any other symptoms concerning for apnea.  05/18/2022  Michele [name] returns today for follow-up with her husband.  She has recently been having some sharp central to left-sided chest pains.  Symptoms are sporadic and not necessarily worse with exertion or relieved by rest.  She has had some interval weight loss about 8 to 10 pounds.  She has felt worsening fatigue and exercise intolerance.  Heart rate is in the low 50s today with a low normal blood pressure 109/68.  She is on metoprolol .  She had a coronary CT angiogram back in June 2020.  This demonstrated a calcium  score of 129, 97th percentile for age and matched controls, however mild nonobstructive coronary disease was noted.  She has had excellent blood pressure control.  Her cholesterol also is reasonably well treated with total 148, HDL 47, triglycerides 115 and LDL 78.  Her target LDL is less than 70.  08/31/2022  Michele [name] is seen today in follow-up. She is feeling somewhat better. Her repeat CT coronary angiogram in December showed no obstructive CAD, but her calcium  score has progressed to 352, now 98th percentile for age and sex matched control. Her heartrate and BP are better today. She never decreased her medication as I had suggested. We were also supposed to  review labs, but that has not been drawn.  PMHx:  Past Medical History:  Diagnosis Date   Abnormal Pap smear of cervix    cryotx   Anxiety    Arthritis    Asthma    Depression    Endometriosis    Fatty liver    Gastric polyp    GERD (gastroesophageal reflux disease)    Hemorrhoids    Hiatal hernia    HLD (hyperlipidemia)    Hyperplastic colon polyp    IBS (irritable bowel syndrome)    Iron deficiency anemia 1/12   Pneumonia    Rectocele    Rotator cuff tear, left 2009   Syncope    Tachycardia    s/p negative cardiac work up with  echo   Uterine fibroid [id number]    Past Surgical History:  Procedure Laterality Date   CARPAL TUNNEL RELEASE Bilateral    COLONOSCOPY  1999   small polyps, hemms   ESOPHAGOGASTRODUODENOSCOPY  2/12   gastric polyp and HH   HYSTEROSCOPY     LAPAROSCOPY  2006   endometriosis   ROTATOR CUFF REPAIR Left    TRIGGER FINGER RELEASE      FAMHx:  Family History  Problem Relation Age of Onset   Dementia Mother    Allergic rhinitis Mother    Hypertension Father    Diabetes Father    Stroke Father    Migraines Father    Barrett's esophagus Father    Anemia Father        iron def   Heart disease Father        pacemaker, a fib, CHF   Bipolar disorder Father    Diabetes Sister    Hyperlipidemia Sister    Deep vein thrombosis Sister    Anxiety disorder Daughter    Bipolar disorder Daughter    ADD / ADHD Daughter    Neuropathy Daughter    Syncope episode Daughter    COPD Paternal Grandmother    Breast cancer Paternal Grandmother 34 - 63       double mastectomy   Diabetes Paternal Grandfather    Angioedema Neg Hx    Asthma Neg Hx    Atopy Neg Hx    Eczema Neg Hx    Immunodeficiency Neg Hx    Urticaria Neg Hx     SOCHx:   reports that she quit smoking about 31 years ago. Her smoking use included cigarettes. She started smoking about 36 years ago. She has a 1.3 pack-year smoking history. She has been exposed to tobacco smoke. She has never used smokeless tobacco. She reports current alcohol use of about 1.0 standard drink of alcohol per week. She reports that she does not use drugs.  ALLERGIES:  Allergies  Allergen Reactions   Codeine  Itching   Hydrocodone-Acetaminophen  Itching   Lactose Intolerance (Gi) Other (See Comments)   Other Other (See Comments)   Povidone-Iodine Rash        Propoxyphene N-Acetaminophen  Itching        Tolmetin Nausea And Vomiting    ROS: Pertinent items noted in HPI and remainder of comprehensive ROS otherwise negative.  HOME MEDS: Current  Outpatient Medications on File Prior to Visit  Medication Sig Dispense Refill   albuterol  (VENTOLIN  HFA) 108 (90 Base) MCG/ACT inhaler INHALE 2 PUFFS INTO THE LUNGS EVERY 4 HOURS AS NEEDED FOR WHEEZE 8 g 11   aspirin EC 81 MG tablet Take 81 mg by mouth daily.     CALCIUM  PO Take  by mouth.     cholecalciferol (VITAMIN D3) 25 MCG (1000 UNIT) tablet Take 1,000 Units by mouth daily.     dicyclomine  (BENTYL ) 10 MG capsule Take 1 capsule (10 mg total) by mouth 3 (three) times daily before meals. 90 capsule 2   esomeprazole  (NEXIUM ) 40 MG capsule TAKE 1 CAPSULE BY MOUTH EVERY DAY IN THE MORNING 90 capsule 1   fluticasone  (FLONASE ) 50 MCG/ACT nasal spray Place 1 spray into both nostrils daily for 14 days. 16 g 0   hydrocortisone  (ANUSOL -HC) 2.5 % rectal cream Place 1 Application rectally 2 (two) times daily. 30 g 1   montelukast  (SINGULAIR ) 10 MG tablet TAKE 1 TABLET BY MOUTH EVERYDAY AT BEDTIME 90 tablet 1   naproxen sodium (ALEVE) 220 MG tablet Take 440 mg by mouth daily as needed (pain).     Omega-3 Fatty Acids (FISH OIL OMEGA-3 PO) Take by mouth.     traZODone (DESYREL) 50 MG tablet Take 25 mg by mouth at bedtime.     triamcinolone  cream (KENALOG ) 0.1 % Apply 1 Application topically 2 (two) times daily. 30 g 1   vitamin C (ASCORBIC ACID) 500 MG tablet Take 500 mg by mouth daily.     vitamin E  (VITAMIN E ) 400 UNIT capsule Take 2 capsules (800 Units total) by mouth daily. 60 capsule 5   zinc gluconate 50 MG tablet Take 50 mg by mouth daily.     atorvastatin  (LIPITOR) 80 MG tablet Take 1 tablet (80 mg total) by mouth daily. 90 tablet 3   No current facility-administered medications on file prior to visit.    LABS/IMAGING: No results found for this or any previous visit (from the past 48 hours). No results found.  LIPID PANEL:    Component Value Date/Time   CHOL 140 02/20/2023 0907   CHOL 149 11/11/2018 1004   TRIG 103.0 02/20/2023 0907   HDL 50.60 02/20/2023 0907   HDL 55 11/11/2018 1004    CHOLHDL 3 02/20/2023 0907   VLDL 20.6 02/20/2023 0907   LDLCALC 69 02/20/2023 0907   LDLCALC 76 11/11/2018 1004   LDLDIRECT 202.8 03/11/2012 1353    WEIGHTS: Wt Readings from Last 3 Encounters:  04/07/24 206 lb 12.8 oz (93.8 kg)  01/23/24 205 lb (93 kg)  01/17/24 205 lb (93 kg)    VITALS: BP (!) 155/76 (BP Location: Left Arm, Patient Position: Sitting, Cuff Size: Large)   Pulse 76   Resp 16   Ht 5' 7 (1.702 m)   Wt 206 lb 12.8 oz (93.8 kg)   LMP 03/06/2014   SpO2 95%   BMI 32.39 kg/m   EXAM: General appearance: alert and no distress Neck: no carotid bruit, no JVD and thyroid  not enlarged, symmetric, no tenderness/mass/nodules Lungs: clear to auscultation bilaterally Heart: regular rate and rhythm, S1, S2 normal, no murmur, click, rub or gallop Abdomen: soft, non-tender; bowel sounds normal; no masses,  no organomegaly Extremities: extremities normal, atraumatic, no cyanosis or edema Pulses: 2+ and symmetric Skin: Skin color, texture, turgor normal. No rashes or lesions Neurologic: Grossly normal Psych: Pleasant  EKG: Normal sinus rhythm at 65 - personally reviewed  ASSESSMENT: Chest pain, possibly concerning for unstable angina Abnormal coronary CTA with CAC score 129, 97th percentile, minimal nonobstructive coronary disease (12/2018) Possible familial hyperlipidemia-Dutch score 6 Family history of premature coronary disease  PLAN: 1.   Mrs. Clemson seems to be doing better. Her recent CT was non-obstructive, but her calcium  score and percentile ranking is worse. I do  want to re-assess her lipids and her LP(a). Will continue her current medications at this time. Follow-up in 6 months.  Vinie KYM Maxcy, MD, [facility], FACP  [place]  [facility]  Medical Director of the [facility] Lipid [facility] &  Cardiovascular Risk Reduction Clinic Diplomate of the American Board of Clinical Lipidology Attending Cardiologist  Direct Dial: [phone]  Fax: [phone]   Website:  [email or web address]  Vinie BROCKS Italia Wolfert 04/07/2024, 3:52 PM

## 2024-04-08 LAB — NMR, LIPOPROFILE
Cholesterol, Total: 164 mg/dL (ref 100–199)
HDL Particle Number: 41.6 umol/L (ref >=30.5)
HDL-C: 67 mg/dL (ref >39)
LDL Particle Number: 1067 nmol/L — ABNORMAL HIGH (ref <1000)
LDL Size: 20.3 nm — ABNORMAL LOW (ref >20.5)
LDL-C (NIH Calc): 84 mg/dL (ref 0–99)
LP-IR Score: 30 (ref <=45)
Small LDL Particle Number: 581 nmol/L — ABNORMAL HIGH (ref <=527)
Triglycerides: 69 mg/dL (ref 0–149)

## 2024-04-08 LAB — LIPOPROTEIN A (LPA): Lipoprotein (a): <8.4 nmol/L (ref <75.0)

## 2024-04-09 ENCOUNTER — Other Ambulatory Visit: Payer: Self-pay | Admitting: Physician Assistant

## 2024-04-09 ENCOUNTER — Ambulatory Visit: Payer: Self-pay | Admitting: Internal Medicine

## 2024-04-09 DIAGNOSIS — K580 Irritable bowel syndrome with diarrhea: Secondary | ICD-10-CM

## 2024-04-09 DIAGNOSIS — E782 Mixed hyperlipidemia: Secondary | ICD-10-CM

## 2024-04-09 MED ORDER — EZETIMIBE 10 MG PO TABS
10.0000 mg | ORAL_TABLET | Freq: Every day | ORAL | 3 refills | Status: AC
Start: 2024-04-09 — End: 2024-07-08

## 2024-04-17 LAB — BASIC METABOLIC PANEL WITH GFR
BUN/Creatinine Ratio: 9 (ref 9–23)
BUN: 6 mg/dL (ref 6–24)
CO2: 24 mmol/L (ref 20–29)
Calcium: 9.6 mg/dL (ref 8.7–10.2)
Chloride: 104 mmol/L (ref 96–106)
Creatinine, Ser: 0.7 mg/dL (ref 0.57–1.00)
Glucose: 122 mg/dL — ABNORMAL HIGH (ref 70–99)
Potassium: 4.1 mmol/L (ref 3.5–5.2)
Sodium: 142 mmol/L (ref 134–144)
eGFR: 100 mL/min/1.73 (ref >59)

## 2024-04-17 LAB — NMR, LIPOPROFILE
Cholesterol, Total: 135 mg/dL (ref 100–199)
HDL Particle Number: 40.6 umol/L (ref >=30.5)
HDL-C: 63 mg/dL (ref >39)
LDL Particle Number: 610 nmol/L (ref <1000)
LDL Size: 20.2 nm — ABNORMAL LOW (ref >20.5)
LDL-C (NIH Calc): 58 mg/dL (ref 0–99)
LP-IR Score: 41 (ref <=45)
Small LDL Particle Number: 390 nmol/L (ref <=527)
Triglycerides: 70 mg/dL (ref 0–149)

## 2024-04-18 ENCOUNTER — Telehealth (HOSPITAL_COMMUNITY): Payer: Self-pay | Admitting: Emergency Medicine

## 2024-04-18 NOTE — Telephone Encounter (Signed)
 Attempted to call patient regarding upcoming cardiac CT appointment. Left message on voicemail with name and callback number Rockwell Alexandria RN Navigator Cardiac Imaging [facility] [phone] Office [phone] Cell

## 2024-04-21 ENCOUNTER — Ambulatory Visit: Payer: Self-pay | Admitting: Internal Medicine

## 2024-04-21 ENCOUNTER — Ambulatory Visit (HOSPITAL_COMMUNITY)
Admission: RE | Admit: 2024-04-21 | Discharge: 2024-04-21 | Disposition: A | Discharge status: Home / Self Care | Source: Ambulatory Visit | Attending: Internal Medicine | Admitting: Internal Medicine

## 2024-04-21 DIAGNOSIS — R0789 Other chest pain: Secondary | ICD-10-CM | POA: Insufficient documentation

## 2024-04-21 DIAGNOSIS — I2510 Atherosclerotic heart disease of native coronary artery without angina pectoris: Secondary | ICD-10-CM | POA: Diagnosis not present

## 2024-04-21 DIAGNOSIS — I2584 Coronary atherosclerosis due to calcified coronary lesion: Secondary | ICD-10-CM | POA: Insufficient documentation

## 2024-04-21 DIAGNOSIS — R0602 Shortness of breath: Secondary | ICD-10-CM | POA: Diagnosis not present

## 2024-04-21 MED ORDER — IOHEXOL 350 MG/ML SOLN
100.0000 mL | Freq: Once | INTRAVENOUS | Status: AC | PRN
  Administered 2024-04-21: 100 mL via INTRAVENOUS

## 2024-04-21 MED ORDER — NITROGLYCERIN 0.4 MG SL SUBL
0.8000 mg | SUBLINGUAL_TABLET | Freq: Once | SUBLINGUAL | Status: AC
  Administered 2024-04-21: 0.8 mg via SUBLINGUAL

## 2024-05-07 ENCOUNTER — Ambulatory Visit: Payer: Self-pay | Admitting: Primary Care

## 2024-05-07 ENCOUNTER — Ambulatory Visit: Admitting: Primary Care

## 2024-05-07 VITALS — BP 122/76 | HR 81 | Temp 98.1°F | Ht 67.5 in | Wt 201.5 lb

## 2024-05-07 DIAGNOSIS — U071 COVID-19: Secondary | ICD-10-CM | POA: Diagnosis not present

## 2024-05-07 DIAGNOSIS — R051 Acute cough: Secondary | ICD-10-CM

## 2024-05-07 LAB — POC COVID19 BINAXNOW: SARS Coronavirus 2 Ag: POSITIVE — AB

## 2024-05-07 MED ORDER — PREDNISONE 20 MG PO TABS: ORAL_TABLET | ORAL | 0 refills | Status: DC

## 2024-05-07 NOTE — Patient Instructions (Signed)
 Start prednisone  20 mg tablets. Take 2 tablets by mouth once daily in the morning for 5 days.  Continue Mucinex  and Flonase .  It was a pleasure to see you today!

## 2024-05-07 NOTE — Assessment & Plan Note (Addendum)
 HPI and presentation representative of viral etiology.  Rapid COVID-19 test positive today.  We discussed conservative treatment and symptom management. Start prednisone  20 mg tablets. Take 2 tablets by mouth once daily in the morning for 5 days.  Continue Mucinex , Flonase . She appears stable for outpatient treatment at this point.  ED precautions provided.
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
## 2024-05-12 ENCOUNTER — Ambulatory Visit: Payer: Self-pay

## 2024-05-12 NOTE — Telephone Encounter (Signed)
 FYI Only or Action Required?: Action required by provider: update on patient condition.  Patient was last seen in primary care on 05/07/2024 by Gretta Comer POUR, NP.  Called Nurse Triage reporting No chief complaint on file..  Symptoms began a week ago.  Interventions attempted: Prescription medications: Prednisone .  Symptoms are: unchanged.  Triage Disposition: Call PCP Now  Patient/caregiver understands and will follow disposition?: Yes   Copied from CRM [id number]. Topic: Clinical - Red Word Triage >> May 12, 2024  8:05 AM Michele [name] wrote: Reason for CRM: patient called in stating she was diagnosed with covid on Wednesday. Patient stated she was given prednisone  for five days but it is not helping. Patient also took mucinex  DM and flonase .  Patient stated she still have a cough, sinus congestion and cold sweats and she want to see if she can be called in more medication from [facility] Reason for Disposition  [1] Persisting symptoms of COVID-19 AND [2] NEW symptom AND [3] could be serious    Contacted [facility], advised that patient is to make another appointment to be seen, appointment scheduled.  Answer Assessment - Initial Assessment Questions 1. COVID-19 ONSET: When did the symptoms of COVID-19 first start?     A week ago  2. DIAGNOSIS CONFIRMATION: How do you know you have had COVID-19? (e.g., positive lab test or self- test, diagnosed by doctor or NP/PA, symptoms after exposure)     Tested and diagnosed in Office  3. MAIN SYMPTOM:  What is your main concern or symptom right now? (e.g., breathing difficulty, cough, fatigue. loss of smell)     Congestion, Cold Sweats, Cough   5. BETTER-SAME-WORSE: Are you getting better, staying the same, or getting worse over the last 1 to 2 weeks?     Same  6. BREATHING DIFFICULTY: Are you having any trouble breathing? If Yes, ask: How bad is your breathing? (e.g., mild, moderate, severe)      No  7. O2 SATURATION MONITOR:  Do you  use an oxygen saturation monitor (pulse oximeter) at home? If Yes, ask What is your reading (oxygen level) today? What is your usual oxygen saturation reading? (e.g., 95%)     Patient has not measured  8. OTHER SYMPTOMS: Do you have any other symptoms?  (e.g., cough, fatigue, fever, headache, muscle pain, shortness of breath, weakness)     Weakness, Fatigue  9. RECENT MEDICAL VISIT: Have you been seen by a healthcare provider (doctor, NP, PA) for these persisting COVID-19 symptoms? If Yes, ask: When were you seen? (e.g., date)     Recent visit on last week  9. HIGH RISK DISEASE: Do you have any chronic medical problems? (e.g., asthma, heart or lung disease, weak immune system, obesity, etc.)     Asthma  10. VACCINE: Have you gotten the COVID-19 vaccine? If Yes, ask: Which one, how many shots, when did you get it?       Received one vaccination  11. PREGNANCY: Is there any chance you are pregnant? When was your last menstrual period?       No and No  Protocols used: COVID-19 - Persisting Symptoms Follow-up Call-[facility]

## 2024-05-12 NOTE — Telephone Encounter (Signed)
 Will see patient then Agree with ER and UC precautions

## 2024-05-13 ENCOUNTER — Encounter: Payer: Self-pay | Admitting: Family Medicine

## 2024-05-13 ENCOUNTER — Ambulatory Visit: Admitting: Family Medicine

## 2024-05-13 VITALS — BP 141/80 | HR 75 | Temp 98.6°F | Ht 67.5 in | Wt 200.5 lb

## 2024-05-13 DIAGNOSIS — U071 COVID-19: Secondary | ICD-10-CM

## 2024-05-13 DIAGNOSIS — J019 Acute sinusitis, unspecified: Secondary | ICD-10-CM | POA: Insufficient documentation

## 2024-05-13 DIAGNOSIS — J0100 Acute maxillary sinusitis, unspecified: Secondary | ICD-10-CM | POA: Diagnosis not present

## 2024-05-13 MED ORDER — AMOXICILLIN-POT CLAVULANATE 875-125 MG PO TABS: 1.0000 | ORAL_TABLET | Freq: Two times a day (BID) | ORAL | 0 refills | Status: DC

## 2024-05-13 MED ORDER — BENZONATATE 200 MG PO CAPS: 200.0000 mg | ORAL_CAPSULE | Freq: Three times a day (TID) | ORAL | 1 refills | Status: DC | PRN

## 2024-05-13 NOTE — Patient Instructions (Addendum)
 Stop the vics nasal inhaler - it can cause rebound congestion  Continue the flonase   Nasal saline spray is also great   Chills can mean fever so tylenol  is ok as needed  Drink lots of fluids  Keep resting    Try the generic tessalon  for cough  Take the augmentin  for a sinus infection   Update if not starting to improve in a week or if worsening

## 2024-05-13 NOTE — Assessment & Plan Note (Addendum)
 Reassuring exam Reviewed note and plan from NP Clark on 11/5 Has developed a secondary sinus infection  Lungs sound fairly clear after prednisone  course   Will treat with augmentin   Tessalon  for cough with mucinex  dm  Monitor temp   Disc symptomatic care - see instructions on AVS  Update if not starting to improve in a week or if worsening  Call back and Er precautions noted in detail today

## 2024-05-13 NOTE — Assessment & Plan Note (Signed)
 In setting of covid 19 Sinus pain and congestion  S/p course of prednisone  Instructed to stop the fast acting nasal spray / can cause rebound congestion  Continue flonase  Can add nasal saline  Fluids/rest Disc symptomatic care - see instructions on AVS  Augmentin  sent to pharmacy  Also tessalon  pearles   Update if not starting to improve in a week or if worsening  Call back and Er precautions noted in detail today

## 2024-05-13 NOTE — Progress Notes (Signed)
 Subjective:    Patient ID: Michele [name], female    DOB: Sep 08, 1965, 58 y.o.   MRN: [id number]  HPI  Wt Readings from Last 3 Encounters:  05/13/24 200 lb 8 oz (90.9 kg)  05/07/24 201 lb 8 oz (91.4 kg)  04/07/24 206 lb 12.8 oz (93.8 kg)   30.94 kg/m  Vitals:   05/13/24 1452 05/13/24 1510  BP: (!) 152/90 (!) 141/80  Pulse: 75   Temp: 98.6 F (37 C)   SpO2: 96%     Pt presents for follow up of  Covid 19   Dx on 11/5  Saw NP Clark Treated with prednisone  40 mg for 5 d    Congestion - in nose -about the same (cannot get much out when she blows her nose)  Cold sweats on and off  Cough - dry /barking  Nostrils burn    - cannot smell much  Not a lot of wheezing  A little tightness /not bad  Some sinus pain and pressure -both sides   Throat feels raw  Ears -no problems   Has not taken her temp  No aches    Over the counter Mucinex  dm  Flonase   Vics nasal spray  Occational aleve     Patient Active Problem List   Diagnosis Date Noted   Acute sinusitis 05/13/2024   COVID-19 virus infection 05/07/2024   Acute non-recurrent frontal sinusitis 01/23/2024   Diffuse abdominal pain 12/04/2023   Neck pain on right side 04/30/2023   Poor balance 04/30/2023   Fatty liver 02/23/2023   Obesity (BMI 30-39.9) 02/23/2023   Blurry vision 02/23/2023   Personal history of fall 02/23/2023   Colon cancer screening 01/31/2022   Encounter for screening mammogram for breast cancer 01/31/2022   Vitamin B12 deficiency 12/22/2020   Elevated TSH 12/22/2020   Current use of proton pump inhibitor 12/14/2020   Ingrown toenail of right foot 12/02/2020   Fatigue 12/02/2020   Onychomycosis 12/02/2020   Meralgia paresthetica 10/15/2019   Bipolar 1 disorder, mixed, moderate (HCC) 08/25/2019   Family history of DVT 08/25/2019   BPV (benign positional vertigo) 11/04/2018   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   Allergic rhinitis 01/10/2017   Tick bite of abdomen 11/15/2016    Adjustment reaction with anxious mood 08/18/2015   Arm paresthesia, right 07/14/2015   Diarrhea 10/13/2014   Rectocele 10/01/2013   Mixed incontinence 04/23/2013   Encounter for routine gynecological examination 04/01/2012   Prediabetes 04/01/2012   Routine general medical examination at a health care facility 03/11/2012   GERD 08/26/2010   History of anemia 07/19/2010   FREQUENCY, URINARY 07/06/2010   MICROSCOPIC HEMATURIA 06/09/2010   History of colonic polyps 11/15/2007   IBS 10/08/2007   ENDOMETRIOSIS 10/08/2007   Mixed hyperlipidemia 10/30/2006   Anxiety and depression 10/30/2006   HEMORRHOIDS, INTERNAL 10/30/2006   Asthma 10/30/2006   MIGRAINES, HX OF 10/30/2006   Past Medical History:  Diagnosis Date   Abnormal Pap smear of cervix    cryotx   Anxiety    Arthritis    Asthma    Depression    Endometriosis    Fatty liver    Gastric polyp    GERD (gastroesophageal reflux disease)    Hemorrhoids    Hiatal hernia    HLD (hyperlipidemia)    Hyperplastic colon polyp    IBS (irritable bowel syndrome)    Iron deficiency anemia 1/12   Pneumonia    Rectocele    Rotator cuff tear, left 2009  Syncope    Tachycardia    s/p negative cardiac work up with echo   Uterine fibroid [id number]   Past Surgical History:  Procedure Laterality Date   CARPAL TUNNEL RELEASE Bilateral    COLONOSCOPY  1999   small polyps, hemms   ESOPHAGOGASTRODUODENOSCOPY  2/12   gastric polyp and HH   HYSTEROSCOPY     LAPAROSCOPY  2006   endometriosis   ROTATOR CUFF REPAIR Left    TRIGGER FINGER RELEASE     Social History   Tobacco Use   Smoking status: Former    Current packs/day: 0.00    Average packs/day: 0.3 packs/day for 5.0 years (1.3 ttl pk-yrs)    Types: Cigarettes    Start date: 07/04/1987    Quit date: 07/03/1992    Years since quitting: 31.8    Passive exposure: Past   Smokeless tobacco: Never  Vaping Use   Vaping status: Never Used  Substance Use Topics   Alcohol use: Yes     Alcohol/week: 1.0 standard drink of alcohol    Types: 1 Glasses of wine per week   Drug use: No   Family History  Problem Relation Age of Onset   Dementia Mother    Allergic rhinitis Mother    Hypertension Father    Diabetes Father    Stroke Father    Migraines Father    Barrett's esophagus Father    Anemia Father        iron def   Heart disease Father        pacemaker, a fib, CHF   Bipolar disorder Father    Diabetes Sister    Hyperlipidemia Sister    Deep vein thrombosis Sister    Anxiety disorder Daughter    Bipolar disorder Daughter    ADD / ADHD Daughter    Neuropathy Daughter    Syncope episode Daughter    COPD Paternal Grandmother    Breast cancer Paternal Grandmother 28 - 29       double mastectomy   Diabetes Paternal Grandfather    Angioedema Neg Hx    Asthma Neg Hx    Atopy Neg Hx    Eczema Neg Hx    Immunodeficiency Neg Hx    Urticaria Neg Hx    Allergies  Allergen Reactions   Codeine  Itching   Hydrocodone-Acetaminophen  Itching   Lactose Intolerance (Gi) Other (See Comments)   Other Other (See Comments)   Povidone-Iodine Rash        Propoxyphene N-Acetaminophen  Itching        Tolmetin Nausea And Vomiting   Current Outpatient Medications on File Prior to Visit  Medication Sig Dispense Refill   albuterol  (VENTOLIN  HFA) 108 (90 Base) MCG/ACT inhaler INHALE 2 PUFFS INTO THE LUNGS EVERY 4 HOURS AS NEEDED FOR WHEEZE 8 g 11   aspirin EC 81 MG tablet Take 81 mg by mouth daily.     atorvastatin  (LIPITOR) 80 MG tablet Take 1 tablet (80 mg total) by mouth daily. 90 tablet 3   CALCIUM  PO Take by mouth.     cholecalciferol (VITAMIN D3) 25 MCG (1000 UNIT) tablet Take 1,000 Units by mouth daily.     dicyclomine  (BENTYL ) 10 MG capsule Take 1 capsule (10 mg total) by mouth 3 (three) times daily before meals. 90 capsule 1   esomeprazole  (NEXIUM ) 40 MG capsule TAKE 1 CAPSULE BY MOUTH EVERY DAY IN THE MORNING 90 capsule 1   ezetimibe (ZETIA) 10 MG tablet Take 1  tablet (10 mg  total) by mouth daily. 90 tablet 3   fluticasone  (FLONASE ) 50 MCG/ACT nasal spray Place 1 spray into both nostrils daily for 14 days. 16 g 0   hydrocortisone  (ANUSOL -HC) 2.5 % rectal cream Place 1 Application rectally 2 (two) times daily. 30 g 1   metoprolol  tartrate (LOPRESSOR ) 100 MG tablet Take 1 tablet (100 mg total) by mouth as directed. Take TWO HOURS before CT test 1 tablet 0   montelukast  (SINGULAIR ) 10 MG tablet TAKE 1 TABLET BY MOUTH EVERYDAY AT BEDTIME 90 tablet 1   naproxen sodium (ALEVE) 220 MG tablet Take 440 mg by mouth daily as needed (pain).     Omega-3 Fatty Acids (FISH OIL OMEGA-3 PO) Take by mouth.     telmisartan (MICARDIS) 40 MG tablet Take 1 tablet (40 mg total) by mouth daily. 90 tablet 3   traZODone (DESYREL) 50 MG tablet Take 25 mg by mouth at bedtime.     triamcinolone  cream (KENALOG ) 0.1 % Apply 1 Application topically 2 (two) times daily. 30 g 1   vitamin C (ASCORBIC ACID) 500 MG tablet Take 500 mg by mouth daily.     vitamin E  (VITAMIN E ) 400 UNIT capsule Take 2 capsules (800 Units total) by mouth daily. 60 capsule 5   zinc gluconate 50 MG tablet Take 50 mg by mouth daily.     No current facility-administered medications on file prior to visit.    Review of Systems  Constitutional:  Positive for chills and diaphoresis. Negative for activity change, appetite change, fatigue, fever and unexpected weight change.  HENT:  Positive for congestion, postnasal drip, sinus pressure, sinus pain and sore throat. Negative for ear pain, rhinorrhea and trouble swallowing.   Eyes:  Negative for pain, redness and visual disturbance.  Respiratory:  Positive for cough and chest tightness. Negative for shortness of breath, wheezing and stridor.   Cardiovascular:  Negative for chest pain and palpitations.  Gastrointestinal:  Negative for abdominal pain, blood in stool, constipation and diarrhea.  Endocrine: Negative for polydipsia and polyuria.  Genitourinary:   Negative for dysuria, frequency and urgency.  Musculoskeletal:  Negative for arthralgias, back pain and myalgias.  Skin:  Negative for pallor and rash.  Allergic/Immunologic: Negative for environmental allergies.  Neurological:  Positive for headaches. Negative for dizziness and syncope.  Hematological:  Negative for adenopathy. Does not bruise/bleed easily.  Psychiatric/Behavioral:  Negative for decreased concentration and dysphoric mood. The patient is not nervous/anxious.        Objective:   Physical Exam Constitutional:      General: She is not in acute distress.    Appearance: Normal appearance. She is well-developed. She is obese. She is not ill-appearing, toxic-appearing or diaphoretic.  HENT:     Head: Normocephalic and atraumatic.     Comments: Nares are injected and congested   Bilateral maxillary and frontal sinus tenderness No swelling     Right Ear: Tympanic membrane, ear canal and external ear normal.     Left Ear: Tympanic membrane, ear canal and external ear normal.     Nose: Congestion and rhinorrhea present.     Mouth/Throat:     Mouth: Mucous membranes are moist.     Pharynx: Oropharynx is clear. No oropharyngeal exudate or posterior oropharyngeal erythema.     Comments: Clear pnd  Eyes:     General:        Right eye: No discharge.        Left eye: No discharge.     Conjunctiva/sclera:  Conjunctivae normal.     Pupils: Pupils are equal, round, and reactive to light.  Cardiovascular:     Rate and Rhythm: Normal rate.     Heart sounds: Normal heart sounds.  Pulmonary:     Effort: Pulmonary effort is normal. No respiratory distress.     Breath sounds: Normal breath sounds. No stridor. No wheezing, rhonchi or rales.     Comments: Good air exch No wheeze even on forced expiratoin   Bs are mildly harsh Chest:     Chest wall: No tenderness.  Musculoskeletal:     Cervical back: Normal range of motion and neck supple.  Lymphadenopathy:     Cervical: No  cervical adenopathy.  Skin:    General: Skin is warm and dry.     Capillary Refill: Capillary refill takes less than 2 seconds.     Findings: No rash.  Neurological:     Mental Status: She is alert.     Cranial Nerves: No cranial nerve deficit.  Psychiatric:        Mood and Affect: Mood normal.           Assessment & Plan:   Problem List Items Addressed This Visit       Respiratory   Acute sinusitis - Primary   In setting of covid 19 Sinus pain and congestion  S/p course of prednisone  Instructed to stop the fast acting nasal spray / can cause rebound congestion  Continue flonase  Can add nasal saline  Fluids/rest Disc symptomatic care - see instructions on AVS  Augmentin  sent to pharmacy  Also tessalon  pearles   Update if not starting to improve in a week or if worsening  Call back and Er precautions noted in detail today        Relevant Medications   amoxicillin -clavulanate (AUGMENTIN ) 875-125 MG tablet   benzonatate  (TESSALON ) 200 MG capsule     Other   COVID-19 virus infection   Reassuring exam Has developed a secondary sinus infection  Lungs sound fairly clear after prednisone  course   Will treat with augmentin   Tessalon  for cough with mucinex  dm  Monitor temp   Disc symptomatic care - see instructions on AVS  Update if not starting to improve in a week or if worsening  Call back and Er precautions noted in detail today

## 2024-05-20 ENCOUNTER — Encounter: Payer: Self-pay | Admitting: Family Medicine

## 2024-05-21 MED ORDER — PREDNISONE 10 MG PO TABS: ORAL_TABLET | ORAL | 0 refills | Status: DC

## 2024-06-04 ENCOUNTER — Ambulatory Visit: Payer: Self-pay | Admitting: Family Medicine

## 2024-06-04 ENCOUNTER — Ambulatory Visit (INDEPENDENT_AMBULATORY_CARE_PROVIDER_SITE_OTHER)
Admission: RE | Admit: 2024-06-04 | Discharge: 2024-06-04 | Disposition: A | Source: Ambulatory Visit | Attending: Family Medicine | Admitting: Family Medicine

## 2024-06-04 ENCOUNTER — Ambulatory Visit: Admitting: Family Medicine

## 2024-06-04 ENCOUNTER — Encounter: Payer: Self-pay | Admitting: Family Medicine

## 2024-06-04 VITALS — BP 126/82 | HR 72 | Temp 98.2°F | Ht 67.5 in | Wt 203.4 lb

## 2024-06-04 DIAGNOSIS — S99922A Unspecified injury of left foot, initial encounter: Secondary | ICD-10-CM | POA: Diagnosis not present

## 2024-06-04 DIAGNOSIS — J0100 Acute maxillary sinusitis, unspecified: Secondary | ICD-10-CM | POA: Diagnosis not present

## 2024-06-04 DIAGNOSIS — R052 Subacute cough: Secondary | ICD-10-CM | POA: Diagnosis not present

## 2024-06-04 MED ORDER — IPRATROPIUM BROMIDE 0.06 % NA SOLN: 2.0000 | Freq: Three times a day (TID) | NASAL | 3 refills | Status: DC

## 2024-06-04 MED ORDER — BENZONATATE 200 MG PO CAPS: 200.0000 mg | ORAL_CAPSULE | Freq: Three times a day (TID) | ORAL | 1 refills | Status: AC | PRN

## 2024-06-04 MED ORDER — DOXYCYCLINE HYCLATE 100 MG PO TABS: 100.0000 mg | ORAL_TABLET | Freq: Two times a day (BID) | ORAL | 0 refills | Status: DC

## 2024-06-04 NOTE — Assessment & Plan Note (Signed)
 Continues to have congestion (improved than worsened) despite treatment with augmentin  and 2 rounds of prednisone  Also persistent cough  Left nostril appears more inflamed than right Continues flonase  and singulair   Will add atrovent  ns tid  Try course of doxycycline   Low threshold to refer to ENT if not improved Update if not starting to improve in a week or if worsening  Disc symptomatic care - see instructions on AVS  Call back and Er precautions noted in detail today

## 2024-06-04 NOTE — Assessment & Plan Note (Addendum)
 Stubbed 4th, 5th toes yesterday  Pain/swelling and bruising  Tenderness over dorsal/lateral foot and 5th toe   Xray ordered  Noted possible non displaced fracture at medial base of 5th toe  Recommend hard soled shoe Ice Elevation  Buddy tape 5th toe to 4th as needed   Update if not starting to improve in a week or if worsening  Call back and Er precautions noted in detail today

## 2024-06-04 NOTE — Patient Instructions (Addendum)
 Treat your symptoms  Stay hydrated   Benzonatate  for cough  Mucinex  DM also   Continue flonase   Saline as needed Add atrovent  nasal spray   Take doxycycline  as directed   Chest xray now-we will reach out with results   Foot xray now -we will reach out with results   Try taping your pinkie toe to the next toe Cold compress as needed  Elevate foot as needed  Wear supportive shoe    Update if not starting to improve in a week or if worsening

## 2024-06-04 NOTE — Progress Notes (Signed)
 Subjective:    Patient ID: Michele [name], female    DOB: 1965-09-14, 58 y.o.   MRN: [id number]  HPI  Wt Readings from Last 3 Encounters:  06/04/24 203 lb 6 oz (92.3 kg)  05/13/24 200 lb 8 oz (90.9 kg)  05/07/24 201 lb 8 oz (91.4 kg)   31.38 kg/m  Vitals:   06/04/24 1130  BP: 126/82  Pulse: 72  Temp: 98.2 F (36.8 C)  SpO2: 98%     Pt presents for c/o  Ongoing uri symptoms  Congestion  Cough  Left foot injury     Last visit 11/11 seen for sinus infection following covid 19 Treated with augmentin   Flonase   (prednisone  prior)  Nasal saline  tessalon  Instructed to stop fast acting nasal spray  Called recently -still congested We sent another prednisone  prescription 40 mg taper (on 11/18)  Thought she did better for a while  Then symptoms worsened again   Worse at night  Cough - dry (hacking/barking) - night and morning  No heartburn  Congestion-mucous in nose  Nose bleeds at times  Mucous is green ago (never really cleared up)  Not sneezing much now  Some facial pressure and tenderness   Augmentin  helped for a little while  Prednisone  helped shortly   Seemed to worsen with cold weather    Taking  Flonase   Saline  Singulair       Hurt her left foot yesterday am  Ran into an object on the floor  Pinkie toe hit the hardest  Very sore and bruised and tender now  Can walk on it    DG Foot Complete Left Result Date: 06/04/2024 EXAM: 3 OR MORE VIEW(S) XRAY OF THE LEFT FOOT 06/04/2024 12:20:47 PM COMPARISON: Left foot 12/08/2020. CLINICAL HISTORY: stubbed lateral toes / bruising and swelling and pain of 5th toe and dorsal foot rule out fracture FINDINGS: BONES AND JOINTS: Possible nondisplaced fracture at the medial base of the fifth toe proximal phalanx seen only on the AP view. Plantar and posterior calcaneal spurs. Mild midfoot degenerative changes. No focal osseous lesion. No joint dislocation. SOFT TISSUES: Mild dorsal soft tissue swelling.  IMPRESSION: 1. Possible nondisplaced fracture at the medial base of the fifth toe proximal phalanx, seen only on the AP view. 2. Mild midfoot degenerative changes. Electronically signed by: Toribio Agreste MD 06/04/2024 01:18 PM EST RP Workstation: [id number]   DG Chest 2 View Result Date: 06/04/2024 EXAM: 2 VIEW(S) XRAY OF THE CHEST 06/04/2024 12:20:47 PM COMPARISON: Chest x-ray 02/25/2018. CLINICAL HISTORY: Cough since October (was productive now dry), started with COVID, now persistent cough. FINDINGS: LUNGS AND PLEURA: No focal pulmonary opacity. No pleural effusion. No pneumothorax. HEART AND MEDIASTINUM: No acute abnormality of the cardiac and mediastinal silhouettes. BONES AND SOFT TISSUES: Suture anchor in left humeral head. IMPRESSION: 1. No acute cardiopulmonary process. Electronically signed by: Toribio Agreste MD 06/04/2024 01:15 PM EST RP Workstation: [id number]      Patient Active Problem List   Diagnosis Date Noted   Subacute cough 06/04/2024   Foot injury, left, initial encounter 06/04/2024   Acute sinusitis 05/13/2024   COVID-19 virus infection 05/07/2024   Diffuse abdominal pain 12/04/2023   Neck pain on right side 04/30/2023   Poor balance 04/30/2023   Fatty liver 02/23/2023   Obesity (BMI 30-39.9) 02/23/2023   Blurry vision 02/23/2023   Personal history of fall 02/23/2023   Colon cancer screening 01/31/2022   Encounter for screening mammogram for breast cancer 01/31/2022   Vitamin B12  deficiency 12/22/2020   Elevated TSH 12/22/2020   Current use of proton pump inhibitor 12/14/2020   Ingrown toenail of right foot 12/02/2020   Fatigue 12/02/2020   Onychomycosis 12/02/2020   Meralgia paresthetica 10/15/2019   Bipolar 1 disorder, mixed, moderate (HCC) 08/25/2019   Family history of DVT 08/25/2019   BPV (benign positional vertigo) 11/04/2018   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   Allergic rhinitis 01/10/2017   Tick bite of abdomen 11/15/2016   Adjustment reaction  with anxious mood 08/18/2015   Arm paresthesia, right 07/14/2015   Diarrhea 10/13/2014   Rectocele 10/01/2013   Mixed incontinence 04/23/2013   Encounter for routine gynecological examination 04/01/2012   Prediabetes 04/01/2012   Routine general medical examination at a health care facility 03/11/2012   GERD 08/26/2010   History of anemia 07/19/2010   FREQUENCY, URINARY 07/06/2010   MICROSCOPIC HEMATURIA 06/09/2010   History of colonic polyps 11/15/2007   IBS 10/08/2007   ENDOMETRIOSIS 10/08/2007   Mixed hyperlipidemia 10/30/2006   Anxiety and depression 10/30/2006   HEMORRHOIDS, INTERNAL 10/30/2006   Asthma 10/30/2006   MIGRAINES, HX OF 10/30/2006   Past Medical History:  Diagnosis Date   Abnormal Pap smear of cervix    cryotx   Anxiety    Arthritis    Asthma    Depression    Endometriosis    Fatty liver    Gastric polyp    GERD (gastroesophageal reflux disease)    Hemorrhoids    Hiatal hernia    HLD (hyperlipidemia)    Hyperplastic colon polyp    IBS (irritable bowel syndrome)    Iron deficiency anemia 1/12   Pneumonia    Rectocele    Rotator cuff tear, left 2009   Syncope    Tachycardia    s/p negative cardiac work up with echo   Uterine fibroid [id number]   Past Surgical History:  Procedure Laterality Date   CARPAL TUNNEL RELEASE Bilateral    COLONOSCOPY  1999   small polyps, hemms   ESOPHAGOGASTRODUODENOSCOPY  2/12   gastric polyp and HH   HYSTEROSCOPY     LAPAROSCOPY  2006   endometriosis   ROTATOR CUFF REPAIR Left    TRIGGER FINGER RELEASE     Social History   Tobacco Use   Smoking status: Former    Current packs/day: 0.00    Average packs/day: 0.3 packs/day for 5.0 years (1.3 ttl pk-yrs)    Types: Cigarettes    Start date: 07/04/1987    Quit date: 07/03/1992    Years since quitting: 31.9    Passive exposure: Past   Smokeless tobacco: Never  Vaping Use   Vaping status: Never Used  Substance Use Topics   Alcohol use: Yes    Alcohol/week: 1.0  standard drink of alcohol    Types: 1 Glasses of wine per week   Drug use: No   Family History  Problem Relation Age of Onset   Dementia Mother    Allergic rhinitis Mother    Hypertension Father    Diabetes Father    Stroke Father    Migraines Father    Barrett's esophagus Father    Anemia Father        iron def   Heart disease Father        pacemaker, a fib, CHF   Bipolar disorder Father    Diabetes Sister    Hyperlipidemia Sister    Deep vein thrombosis Sister    Anxiety disorder Daughter    Bipolar disorder  Daughter    ADD / ADHD Daughter    Neuropathy Daughter    Syncope episode Daughter    COPD Paternal Grandmother    Breast cancer Paternal Grandmother 39 - 23       double mastectomy   Diabetes Paternal Grandfather    Angioedema Neg Hx    Asthma Neg Hx    Atopy Neg Hx    Eczema Neg Hx    Immunodeficiency Neg Hx    Urticaria Neg Hx    Allergies  Allergen Reactions   Codeine  Itching   Hydrocodone-Acetaminophen  Itching   Lactose Intolerance (Gi) Other (See Comments)   Other Other (See Comments)   Povidone-Iodine Rash        Propoxyphene N-Acetaminophen  Itching        Tolmetin Nausea And Vomiting   Current Outpatient Medications on File Prior to Visit  Medication Sig Dispense Refill   albuterol  (VENTOLIN  HFA) 108 (90 Base) MCG/ACT inhaler INHALE 2 PUFFS INTO THE LUNGS EVERY 4 HOURS AS NEEDED FOR WHEEZE 8 g 11   aspirin EC 81 MG tablet Take 81 mg by mouth daily.     atorvastatin  (LIPITOR) 80 MG tablet Take 1 tablet (80 mg total) by mouth daily. 90 tablet 3   CALCIUM  PO Take by mouth.     cholecalciferol (VITAMIN D3) 25 MCG (1000 UNIT) tablet Take 1,000 Units by mouth daily.     dicyclomine  (BENTYL ) 10 MG capsule Take 1 capsule (10 mg total) by mouth 3 (three) times daily before meals. 90 capsule 1   esomeprazole  (NEXIUM ) 40 MG capsule TAKE 1 CAPSULE BY MOUTH EVERY DAY IN THE MORNING 90 capsule 1   ezetimibe  (ZETIA ) 10 MG tablet Take 1 tablet (10 mg total) by  mouth daily. 90 tablet 3   fluticasone  (FLONASE ) 50 MCG/ACT nasal spray Place 1 spray into both nostrils daily for 14 days. 16 g 0   hydrocortisone  (ANUSOL -HC) 2.5 % rectal cream Place 1 Application rectally 2 (two) times daily. 30 g 1   metoprolol  tartrate (LOPRESSOR ) 100 MG tablet Take 1 tablet (100 mg total) by mouth as directed. Take TWO HOURS before CT test 1 tablet 0   montelukast  (SINGULAIR ) 10 MG tablet TAKE 1 TABLET BY MOUTH EVERYDAY AT BEDTIME 90 tablet 1   naproxen sodium (ALEVE) 220 MG tablet Take 440 mg by mouth daily as needed (pain).     Omega-3 Fatty Acids (FISH OIL OMEGA-3 PO) Take by mouth.     telmisartan  (MICARDIS ) 40 MG tablet Take 1 tablet (40 mg total) by mouth daily. 90 tablet 3   traZODone (DESYREL) 50 MG tablet Take 25 mg by mouth at bedtime.     triamcinolone  cream (KENALOG ) 0.1 % Apply 1 Application topically 2 (two) times daily. 30 g 1   vitamin C (ASCORBIC ACID) 500 MG tablet Take 500 mg by mouth daily.     vitamin E  (VITAMIN E ) 400 UNIT capsule Take 2 capsules (800 Units total) by mouth daily. 60 capsule 5   zinc gluconate 50 MG tablet Take 50 mg by mouth daily.     No current facility-administered medications on file prior to visit.    Review of Systems  Constitutional:  Positive for fatigue. Negative for appetite change and fever.  HENT:  Positive for congestion, nosebleeds, postnasal drip, rhinorrhea, sinus pressure, sore throat and voice change. Negative for ear discharge, ear pain, facial swelling, sinus pain and sneezing.        Throat is sore only with cough  Eyes:  Negative for pain, redness and itching.  Respiratory:  Positive for cough. Negative for chest tightness, shortness of breath, wheezing and stridor.   Cardiovascular:  Negative for chest pain.  Gastrointestinal:  Negative for abdominal pain, diarrhea, nausea and vomiting.  Endocrine: Negative for polyuria.  Genitourinary:  Negative for dysuria, frequency and urgency.  Musculoskeletal:   Negative for arthralgias and myalgias.  Allergic/Immunologic: Negative for immunocompromised state.  Neurological:  Negative for dizziness, tremors, syncope, weakness, numbness and headaches.  Hematological:  Negative for adenopathy. Does not bruise/bleed easily.  Psychiatric/Behavioral:  Negative for dysphoric mood. The patient is not nervous/anxious.        Objective:   Physical Exam Constitutional:      General: She is not in acute distress.    Appearance: Normal appearance. She is well-developed. She is obese. She is not ill-appearing.  HENT:     Head: Normocephalic and atraumatic.     Comments: Bilateral maxillary and frontal sinus tenderness (mild)  Also ethmoid      Right Ear: Tympanic membrane, ear canal and external ear normal.     Left Ear: Tympanic membrane, ear canal and external ear normal.     Nose: Congestion and rhinorrhea present.     Comments: Left nare is much more injected and swollen than the right     Mouth/Throat:     Pharynx: Oropharynx is clear. No oropharyngeal exudate or posterior oropharyngeal erythema.     Comments: Clear pnd Eyes:     General:        Right eye: No discharge.        Left eye: No discharge.     Conjunctiva/sclera: Conjunctivae normal.     Pupils: Pupils are equal, round, and reactive to light.  Cardiovascular:     Rate and Rhythm: Normal rate and regular rhythm.  Pulmonary:     Effort: Pulmonary effort is normal. No respiratory distress.     Breath sounds: Normal breath sounds. No stridor. No wheezing, rhonchi or rales.     Comments: Good air exch No rales or rhonchi Chest:     Chest wall: No tenderness.  Musculoskeletal:     Cervical back: Normal range of motion and neck supple.     Comments: Left foot  Swelling/bruising over dorsal lateral foot and 4,5th toes Pain with toe movement Tender over base of 4,5th toes  Normal perfusion  Can bear weight     Lymphadenopathy:     Cervical: No cervical adenopathy.  Skin:     General: Skin is warm and dry.     Findings: No rash.  Neurological:     Mental Status: She is alert.     Cranial Nerves: No cranial nerve deficit.     Coordination: Coordination normal.  Psychiatric:        Mood and Affect: Mood normal.           Assessment & Plan:   Problem List Items Addressed This Visit       Respiratory   Acute sinusitis - Primary   Continues to have congestion (improved than worsened) despite treatment with augmentin  and 2 rounds of prednisone  Also persistent cough  Left nostril appears more inflamed than right Continues flonase  and singulair   Will add atrovent  ns tid  Try course of doxycycline   Low threshold to refer to ENT if not improved Update if not starting to improve in a week or if worsening  Disc symptomatic care - see instructions on AVS  Call back  and Er precautions noted in detail today        Relevant Medications   doxycycline  (VIBRA -TABS) 100 MG tablet   ipratropium (ATROVENT ) 0.06 % nasal spray   benzonatate  (TESSALON ) 200 MG capsule     Other   Subacute cough   On/off since covid in October  ? Post viral cyclic cough   Now dry No wheeze or shortness of breath  Uncomfortable in dry air   Given length of illness- cxr ordered  Noted no acute changes /reassuring  Refilled tessalon        Relevant Orders   DG Chest 2 View (Completed)   Foot injury, left, initial encounter   Stubbed 4th, 5th toes yesterday  Pain/swelling and bruising  Tenderness over dorsal/lateral foot and 5th toe   Xray ordered  Noted possible non displaced fracture at medial base of 5th toe  Recommend hard soled shoe Ice Elevation  Buddy tape 5th toe to 4th as needed   Update if not starting to improve in a week or if worsening  Call back and Er precautions noted in detail today           Relevant Orders   DG Foot Complete Left (Completed)

## 2024-06-04 NOTE — Assessment & Plan Note (Addendum)
 On/off since covid in October  ? Post viral cyclic cough   Now dry No wheeze or shortness of breath  Uncomfortable in dry air   Given length of illness- cxr ordered  Noted no acute changes /reassuring  Refilled tessalon 

## 2024-06-07 ENCOUNTER — Other Ambulatory Visit: Payer: Self-pay | Admitting: Family Medicine

## 2024-06-07 DIAGNOSIS — J4520 Mild intermittent asthma, uncomplicated: Secondary | ICD-10-CM

## 2024-06-10 ENCOUNTER — Ambulatory Visit: Admitting: Family Medicine

## 2024-06-10 ENCOUNTER — Ambulatory Visit: Payer: Self-pay

## 2024-06-10 ENCOUNTER — Encounter: Payer: Self-pay | Admitting: Family Medicine

## 2024-06-10 VITALS — BP 123/70 | HR 90 | Temp 98.5°F | Ht 67.5 in | Wt 203.4 lb

## 2024-06-10 DIAGNOSIS — J0100 Acute maxillary sinusitis, unspecified: Secondary | ICD-10-CM

## 2024-06-10 DIAGNOSIS — L989 Disorder of the skin and subcutaneous tissue, unspecified: Secondary | ICD-10-CM | POA: Diagnosis not present

## 2024-06-10 DIAGNOSIS — L509 Urticaria, unspecified: Secondary | ICD-10-CM

## 2024-06-10 MED ORDER — CETIRIZINE HCL 10 MG PO TABS: 10.0000 mg | ORAL_TABLET | Freq: Every day | ORAL | 1 refills | Status: DC

## 2024-06-10 MED ORDER — PREDNISONE 20 MG PO TABS: 20.0000 mg | ORAL_TABLET | Freq: Every day | ORAL | 0 refills | Status: AC

## 2024-06-10 NOTE — Assessment & Plan Note (Signed)
 In setting of but suspect not related to hives    Skin tag 3 mm noted left mid back  Pink color  Slightly excoriated   Instructed to keep clean / use antibiotic ointment- once healed if this continues to enlarge or itch would refer to dermatology

## 2024-06-10 NOTE — Telephone Encounter (Signed)
 Please hold the doxycycline   Will see her as planned  Agree with ER/ UC precautions

## 2024-06-10 NOTE — Assessment & Plan Note (Signed)
 Hives on torso Worse under breasts/low back,  warm areas   Suspect possible allergy to either  Atrovent  ns Doxycycline  Will hold both   Did eat deer meat, has had before (no history of alpha gal but does routinely get tick bites)   Prescription prednisone  20 mg daily 5 d Zyrtec  10 mg daily   Can use benadryl prn at bedtime if needed  Stay cool Handout given  Update if not starting to improve in a week or if worsening  Call back and Er precautions noted in detail today

## 2024-06-10 NOTE — Progress Notes (Signed)
 Subjective:    Patient ID: Michele [name], female    DOB: September 28, 1965, 58 y.o.   MRN: [id number]  HPI  Wt Readings from Last 3 Encounters:  06/10/24 203 lb 6 oz (92.3 kg)  06/04/24 203 lb 6 oz (92.3 kg)  05/13/24 200 lb 8 oz (90.9 kg)   31.38 kg/m  Vitals:   06/10/24 1423 06/10/24 1442  BP: (!) 152/90 123/70  Pulse: 90   Temp: 98.5 F (36.9 C)   SpO2: 97%      Pt presents with c/o  Hives/rash Also mole on back- rasied/ itchy and red to pink - ? Need to see derm  Sinusitis -follow up     Was seen on 12/3 for ongoing sinusitis (augmentin  and prednisone  prior to this) Prescription doxycycline  and atrovent  ns that day Also benzonatate  for cough   Within 2-3 days of finishing doxy     Takes Flonase   Saline ns Singulair    Started last night  A little itchiness before bed  This am - rash and itching Mainly torso/back  Few spots on upper arms  Worse in warm areas   Did not take anything yet  Benadryl makes her sleepy    Sinus symptoms =finally improving  Still a little congestion - less green than it was but still a little color and some blood   The atrovent  seemed to help congestion       Patient Active Problem List   Diagnosis Date Noted   Urticaria 06/10/2024   Skin lesion of back 06/10/2024   Subacute cough 06/04/2024   Foot injury, left, initial encounter 06/04/2024   Acute sinusitis 05/13/2024   COVID-19 virus infection 05/07/2024   Diffuse abdominal pain 12/04/2023   Neck pain on right side 04/30/2023   Poor balance 04/30/2023   Fatty liver 02/23/2023   Obesity (BMI 30-39.9) 02/23/2023   Blurry vision 02/23/2023   Personal history of fall 02/23/2023   Colon cancer screening 01/31/2022   Encounter for screening mammogram for breast cancer 01/31/2022   Vitamin B12 deficiency 12/22/2020   Elevated TSH 12/22/2020   Current use of proton pump inhibitor 12/14/2020   Ingrown toenail of right foot 12/02/2020   Fatigue 12/02/2020    Onychomycosis 12/02/2020   Meralgia paresthetica 10/15/2019   Bipolar 1 disorder, mixed, moderate (HCC) 08/25/2019   Family history of DVT 08/25/2019   BPV (benign positional vertigo) 11/04/2018   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   Allergic rhinitis 01/10/2017   Adjustment reaction with anxious mood 08/18/2015   Arm paresthesia, right 07/14/2015   Diarrhea 10/13/2014   Rectocele 10/01/2013   Mixed incontinence 04/23/2013   Encounter for routine gynecological examination 04/01/2012   Prediabetes 04/01/2012   Routine general medical examination at a health care facility 03/11/2012   GERD 08/26/2010   History of anemia 07/19/2010   FREQUENCY, URINARY 07/06/2010   MICROSCOPIC HEMATURIA 06/09/2010   History of colonic polyps 11/15/2007   IBS 10/08/2007   ENDOMETRIOSIS 10/08/2007   Mixed hyperlipidemia 10/30/2006   Anxiety and depression 10/30/2006   HEMORRHOIDS, INTERNAL 10/30/2006   Asthma 10/30/2006   MIGRAINES, HX OF 10/30/2006   Past Medical History:  Diagnosis Date   Abnormal Pap smear of cervix    cryotx   Anxiety    Arthritis    Asthma    Depression    Endometriosis    Fatty liver    Gastric polyp    GERD (gastroesophageal reflux disease)    Hemorrhoids    Hiatal hernia  HLD (hyperlipidemia)    Hyperplastic colon polyp    IBS (irritable bowel syndrome)    Iron deficiency anemia 1/12   Pneumonia    Rectocele    Rotator cuff tear, left 2009   Syncope    Tachycardia    s/p negative cardiac work up with echo   Uterine fibroid [id number]   Past Surgical History:  Procedure Laterality Date   CARPAL TUNNEL RELEASE Bilateral    COLONOSCOPY  1999   small polyps, hemms   ESOPHAGOGASTRODUODENOSCOPY  2/12   gastric polyp and HH   HYSTEROSCOPY     LAPAROSCOPY  2006   endometriosis   ROTATOR CUFF REPAIR Left    TRIGGER FINGER RELEASE     Social History   Tobacco Use   Smoking status: Former    Current packs/day: 0.00    Average packs/day: 0.3  packs/day for 5.0 years (1.3 ttl pk-yrs)    Types: Cigarettes    Start date: 07/04/1987    Quit date: 07/03/1992    Years since quitting: 31.9    Passive exposure: Past   Smokeless tobacco: Never  Vaping Use   Vaping status: Never Used  Substance Use Topics   Alcohol use: Yes    Alcohol/week: 1.0 standard drink of alcohol    Types: 1 Glasses of wine per week   Drug use: No   Family History  Problem Relation Age of Onset   Dementia Mother    Allergic rhinitis Mother    Hypertension Father    Diabetes Father    Stroke Father    Migraines Father    Barrett's esophagus Father    Anemia Father        iron def   Heart disease Father        pacemaker, a fib, CHF   Bipolar disorder Father    Diabetes Sister    Hyperlipidemia Sister    Deep vein thrombosis Sister    Anxiety disorder Daughter    Bipolar disorder Daughter    ADD / ADHD Daughter    Neuropathy Daughter    Syncope episode Daughter    COPD Paternal Grandmother    Breast cancer Paternal Grandmother 33 - 53       double mastectomy   Diabetes Paternal Grandfather    Angioedema Neg Hx    Asthma Neg Hx    Atopy Neg Hx    Eczema Neg Hx    Immunodeficiency Neg Hx    Urticaria Neg Hx    Allergies  Allergen Reactions   Codeine  Itching   Atrovent  Hfa [Ipratropium Bromide  Hfa]     ? hives   Doxycycline      ? hives   Hydrocodone-Acetaminophen  Itching   Lactose Intolerance (Gi) Other (See Comments)   Other Other (See Comments)   Povidone-Iodine Rash        Propoxyphene N-Acetaminophen  Itching        Tolmetin Nausea And Vomiting   Current Outpatient Medications on File Prior to Visit  Medication Sig Dispense Refill   albuterol  (VENTOLIN  HFA) 108 (90 Base) MCG/ACT inhaler INHALE 2 PUFFS INTO THE LUNGS EVERY 4 HOURS AS NEEDED FOR WHEEZE 8 g 11   aspirin EC 81 MG tablet Take 81 mg by mouth daily.     atorvastatin  (LIPITOR) 80 MG tablet Take 1 tablet (80 mg total) by mouth daily. 90 tablet 3   benzonatate  (TESSALON )  200 MG capsule Take 1 capsule (200 mg total) by mouth 3 (three) times daily as needed for  cough. Swallow whole 30 capsule 1   CALCIUM  PO Take by mouth.     cholecalciferol (VITAMIN D3) 25 MCG (1000 UNIT) tablet Take 1,000 Units by mouth daily.     dicyclomine  (BENTYL ) 10 MG capsule Take 1 capsule (10 mg total) by mouth 3 (three) times daily before meals. 90 capsule 1   esomeprazole  (NEXIUM ) 40 MG capsule TAKE 1 CAPSULE BY MOUTH EVERY DAY IN THE MORNING 90 capsule 0   ezetimibe  (ZETIA ) 10 MG tablet Take 1 tablet (10 mg total) by mouth daily. 90 tablet 3   fluticasone  (FLONASE ) 50 MCG/ACT nasal spray Place 1 spray into both nostrils daily for 14 days. 16 g 0   hydrocortisone  (ANUSOL -HC) 2.5 % rectal cream Place 1 Application rectally 2 (two) times daily. 30 g 1   metoprolol  tartrate (LOPRESSOR ) 100 MG tablet Take 1 tablet (100 mg total) by mouth as directed. Take TWO HOURS before CT test 1 tablet 0   montelukast  (SINGULAIR ) 10 MG tablet TAKE 1 TABLET BY MOUTH EVERYDAY AT BEDTIME 90 tablet 0   naproxen sodium (ALEVE) 220 MG tablet Take 440 mg by mouth daily as needed (pain).     Omega-3 Fatty Acids (FISH OIL OMEGA-3 PO) Take by mouth.     telmisartan  (MICARDIS ) 40 MG tablet Take 1 tablet (40 mg total) by mouth daily. 90 tablet 3   traZODone (DESYREL) 50 MG tablet Take 25 mg by mouth at bedtime.     triamcinolone  cream (KENALOG ) 0.1 % Apply 1 Application topically 2 (two) times daily. 30 g 1   vitamin C (ASCORBIC ACID) 500 MG tablet Take 500 mg by mouth daily.     vitamin E  (VITAMIN E ) 400 UNIT capsule Take 2 capsules (800 Units total) by mouth daily. 60 capsule 5   zinc gluconate 50 MG tablet Take 50 mg by mouth daily.     No current facility-administered medications on file prior to visit.    Review of Systems  Constitutional:  Negative for activity change, appetite change, fatigue, fever and unexpected weight change.  HENT:  Positive for congestion. Negative for ear pain, facial swelling,  rhinorrhea, sinus pressure, sinus pain and sore throat.        No swelling of mouth/throat  No shortness of breath No wheeze or stridor   Eyes:  Negative for pain, redness and visual disturbance.  Respiratory:  Negative for cough, shortness of breath and wheezing.        Improved cough  Cardiovascular:  Negative for chest pain and palpitations.  Gastrointestinal:  Negative for abdominal pain, blood in stool, constipation and diarrhea.  Endocrine: Negative for polydipsia and polyuria.  Genitourinary:  Negative for dysuria, frequency and urgency.  Musculoskeletal:  Negative for arthralgias, back pain and myalgias.  Skin:  Negative for pallor and rash.  Allergic/Immunologic: Negative for environmental allergies.  Neurological:  Negative for dizziness, syncope and headaches.  Hematological:  Negative for adenopathy. Does not bruise/bleed easily.  Psychiatric/Behavioral:  Negative for decreased concentration and dysphoric mood. The patient is not nervous/anxious.        Objective:   Physical Exam Constitutional:      General: She is not in acute distress.    Appearance: Normal appearance. She is well-developed. She is obese. She is not ill-appearing or diaphoretic.  HENT:     Head: Normocephalic and atraumatic.     Comments: No sinus or facial tenderness   No facial swelling    Right Ear: External ear normal.  Left Ear: External ear normal.     Nose:     Comments: Mild congestion Improved from last visit     Mouth/Throat:     Mouth: Mucous membranes are moist.     Pharynx: Oropharynx is clear. No oropharyngeal exudate or posterior oropharyngeal erythema.     Comments: No mouth or throat swelling Eyes:     General: No scleral icterus.       Right eye: No discharge.        Left eye: No discharge.     Conjunctiva/sclera: Conjunctivae normal.     Pupils: Pupils are equal, round, and reactive to light.  Neck:     Thyroid : No thyromegaly.     Vascular: No carotid bruit or JVD.   Cardiovascular:     Rate and Rhythm: Normal rate and regular rhythm.     Heart sounds: Normal heart sounds.     No gallop.     Comments: HR 90 Pulmonary:     Effort: Pulmonary effort is normal. No respiratory distress.     Breath sounds: Normal breath sounds. No stridor. No wheezing, rhonchi or rales.  Abdominal:     General: There is no distension or abdominal bruit.     Palpations: Abdomen is soft.  Musculoskeletal:     Cervical back: Normal range of motion and neck supple.     Right lower leg: No edema.     Left lower leg: No edema.  Lymphadenopathy:     Cervical: No cervical adenopathy.  Skin:    General: Skin is warm and dry.     Coloration: Skin is not pale.     Findings: No rash.     Comments: Erythematous whelps  Scattered on torso-=confluent over low back, under breasts and in inguinal area (warm areas)  Hands and feet are clear No open areas    3 mm skin tag mid back-erythematous  Slightly excoriated  Not rough    Neurological:     Mental Status: She is alert.     Coordination: Coordination normal.     Deep Tendon Reflexes: Reflexes are normal and symmetric. Reflexes normal.  Psychiatric:        Mood and Affect: Mood normal.           Assessment & Plan:   Problem List Items Addressed This Visit       Respiratory   Acute sinusitis   Symptoms did start to improve on doxycycline  and atrovent  ns but then developed hives  2-3 d left of antibiotic   Will hold both medications Continue flonase  and saline If no continued improvement will ref to ENT-pt will reach out       Relevant Medications   predniSONE  (DELTASONE ) 20 MG tablet   cetirizine  (ZYRTEC ) 10 MG tablet     Musculoskeletal and Integument   Urticaria - Primary   Hives on torso Worse under breasts/low back,  warm areas   Suspect possible allergy to either  Atrovent  ns Doxycycline  Will hold both   Did eat deer meat, has had before (no history of alpha gal but does routinely get  tick bites)   Prescription prednisone  20 mg daily 5 d Zyrtec  10 mg daily   Can use benadryl prn at bedtime if needed  Stay cool Handout given  Update if not starting to improve in a week or if worsening  Call back and Er precautions noted in detail today        Skin lesion of back  In setting of but suspect not related to hives    Skin tag 3 mm noted left mid back  Pink color  Slightly excoriated   Instructed to keep clean / use antibiotic ointment- once healed if this continues to enlarge or itch would refer to dermatology

## 2024-06-10 NOTE — Telephone Encounter (Signed)
 FYI Only or Action Required?: FYI only for provider: appointment scheduled on 12.9.25.  Patient was last seen in primary care on 06/04/2024 by Randeen Laine LABOR, MD.  Called Nurse Triage reporting Allergic Reaction.  Symptoms began yesterday.  Interventions attempted: Nothing.  Symptoms are: gradually worsening.  Triage Disposition: See Physician Within 24 Hours  Patient/caregiver understands and will follow disposition?: Yes      Copied from CRM [id number]. Topic: Clinical - Red Word Triage >> Jun 10, 2024  8:55 AM [name] wrote: Red Word that prompted transfer to Nurse Triage: allergic reaction   Pt stated that she was recently prescribed doxycycline  and ipratropium and thinks the medication is causing an allergic reaction. Pt stated that she is experiencing itching, burning and has red welts on her torso. Reason for Disposition  Hives or itching  Answer Assessment - Initial Assessment Questions Started doxycycline  and atrovent  nasal spray on 12/3. Yesterday she had itching around her torso and waist band. This morning, woke up with more severe itching and hives on abdomen, anywhere there is creases, back, butt, starting on inner thighs and hair line. She states its a burning feeling. Pt denies any shortness of breath worse than normal, tongue swelling of feeling of throat closing. Rn scheduled appt but gave strict instructions to go to ER if anything gets worse, pt stated understanding. Also advised pt to stop medication until seen by Dr. Randeen today. Gave care instructions on how to take care of rash until appt.    1. APPEARANCE of RASH: What does the rash look like? (e.g., spots, blisters, raised areas, skin peeling, scaly)     Raised area 2. SIZE: How big are the spots? (e.g., tip of pen, eraser, coin; inches, centimeters)     Mixed sizes 3. LOCATION: Where is the rash located?     Torso, butt, back, hairline, thighs 4. COLOR: What color is the rash? (Note: It is  difficult to assess rash color in people with darker-colored skin. When this situation occurs, simply ask the caller to describe what they see.)     red 5. ONSET: When did the rash begin?     Noticed it this am 6. FEVER: Do you have a fever? If Yes, ask: What is your temperature, how was it measured, and when did it start?     denies 7. ITCHING: Does the rash itch? If Yes, ask: How bad is the itch? (Scale 1-10; or mild, moderate, severe)     severe 8. CAUSE: What do you think is causing the rash?     New medication 9. NEW MEDICINES: What new medicines are you taking? (e.g., name of antibiotic) When did you start taking this medication?.     Doxycycline  or atrovent  nasal spray 10. OTHER SYMPTOMS: Do you have any other symptoms? (e.g., sore throat, fever, joint pain)       denies  Protocols used: Rash - Widespread On Drugs-A-AH

## 2024-06-10 NOTE — Assessment & Plan Note (Signed)
 Symptoms did start to improve on doxycycline  and atrovent  ns but then developed hives  2-3 d left of antibiotic   Will hold both medications Continue flonase  and saline If no continued improvement will ref to ENT-pt will reach out

## 2024-06-10 NOTE — Patient Instructions (Addendum)
 Stop the doxycycline  and the atrovent  nasal spray   Continue saline nasal spray Also flonase  if helpful   For hives  Take zyrtec  over the counter 10 mg daily  Benadryl at bed time if needed Stay cool   Take prednisone  20 mg daily for 5 days   If sinus symptoms are not better in the next week- let us  know  We would refer to ENT      If symptoms worsen let us  know  If any swelling in mouth/throat or trouble breathing-go to ER

## 2024-06-30 ENCOUNTER — Encounter: Payer: Self-pay | Admitting: Family Medicine

## 2024-06-30 DIAGNOSIS — R052 Subacute cough: Secondary | ICD-10-CM

## 2024-06-30 DIAGNOSIS — J0100 Acute maxillary sinusitis, unspecified: Secondary | ICD-10-CM

## 2024-07-10 NOTE — Telephone Encounter (Signed)
 Pt is very upset that lab orders were not in when she went to [facility] today.  Please advise.

## 2024-07-30 ENCOUNTER — Institutional Professional Consult (permissible substitution) (INDEPENDENT_AMBULATORY_CARE_PROVIDER_SITE_OTHER): Admitting: Otolaryngology

## 2024-08-07 ENCOUNTER — Other Ambulatory Visit: Payer: Self-pay | Admitting: Family Medicine

## 2024-08-26 ENCOUNTER — Institutional Professional Consult (permissible substitution) (INDEPENDENT_AMBULATORY_CARE_PROVIDER_SITE_OTHER): Admitting: Otolaryngology
